# Patient Record
Sex: Female | Born: 1995 | Race: Black or African American | Hispanic: No | Marital: Single | Smoking: Never smoker
Health system: Southern US, Community
[De-identification: ages and names within clinical notes are randomized; demographics above are authoritative.]

## PROBLEM LIST (undated history)

## (undated) DIAGNOSIS — N906 Unspecified hypertrophy of vulva: Secondary | ICD-10-CM

## (undated) DIAGNOSIS — E229 Hyperfunction of pituitary gland, unspecified: Secondary | ICD-10-CM

## (undated) DIAGNOSIS — K5900 Constipation, unspecified: Secondary | ICD-10-CM

## (undated) DIAGNOSIS — J45909 Unspecified asthma, uncomplicated: Secondary | ICD-10-CM

## (undated) DIAGNOSIS — N643 Galactorrhea not associated with childbirth: Secondary | ICD-10-CM

## (undated) DIAGNOSIS — F209 Schizophrenia, unspecified: Secondary | ICD-10-CM

## (undated) DIAGNOSIS — Z8669 Personal history of other diseases of the nervous system and sense organs: Secondary | ICD-10-CM

## (undated) DIAGNOSIS — R198 Other specified symptoms and signs involving the digestive system and abdomen: Secondary | ICD-10-CM

## (undated) DIAGNOSIS — G4730 Sleep apnea, unspecified: Secondary | ICD-10-CM

## (undated) DIAGNOSIS — G43909 Migraine, unspecified, not intractable, without status migrainosus: Secondary | ICD-10-CM

## (undated) DIAGNOSIS — R2232 Localized swelling, mass and lump, left upper limb: Secondary | ICD-10-CM

## (undated) DIAGNOSIS — N281 Cyst of kidney, acquired: Secondary | ICD-10-CM

## (undated) DIAGNOSIS — R1310 Dysphagia, unspecified: Secondary | ICD-10-CM

## (undated) DIAGNOSIS — F509 Eating disorder, unspecified: Secondary | ICD-10-CM

## (undated) HISTORY — DX: Galactorrhea not associated with childbirth: N64.3

## (undated) HISTORY — DX: Hyperfunction of pituitary gland, unspecified: E22.9

## (undated) HISTORY — PX: UMBILICAL HERNIA REPAIR: SHX196

## (undated) HISTORY — DX: Sleep apnea, unspecified: G47.30

## (undated) HISTORY — DX: Unspecified hypertrophy of vulva: N90.60

## (undated) HISTORY — PX: MRI: SHX5353

## (undated) HISTORY — DX: Constipation, unspecified: K59.00

## (undated) HISTORY — PX: COLONOSCOPY: SHX174

---
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
## 2015-01-02 ENCOUNTER — Encounter: Payer: Medicaid Other | Attending: Pediatrics | Admitting: *Deleted

## 2015-01-02 ENCOUNTER — Ambulatory Visit: Payer: Medicaid Other | Admitting: *Deleted

## 2015-01-02 DIAGNOSIS — E282 Polycystic ovarian syndrome: Secondary | ICD-10-CM | POA: Insufficient documentation

## 2015-01-02 DIAGNOSIS — Z713 Dietary counseling and surveillance: Secondary | ICD-10-CM | POA: Insufficient documentation

## 2015-01-02 NOTE — Patient Instructions (Signed)
Size doesn't matter!! Health is what matters and your cholesterol, blood sugar challenges CAN be improved with food and exercise, even if you never lose weight.  And you can control your health, even if your weight never changes  Follow positive people/organizations on social medial like [employer] your beauty standards and the [employer].  Surround yourself with pictures of other awesome ladies like you!  For your health: Exercise by walking on the treadmill or doing dance video.  This will help your heart and your mind be healthy Eat well for your spirit and to nourish your body Limit the sweet drinks like tea, soda, and juice. Instead drink more water  Continue to eat breakfast, but make sure you get 3 meals in instead of just 2.  Eat your veggies J

## 2015-01-02 NOTE — Progress Notes (Signed)
  Pediatric Medical Nutrition Therapy:  Appt start time: 1000 end time:  1030.  Primary Concerns Today:  Natalie Wagner is here with her mom for follow up nutrition counseling pertaining to PCOS, weight management, hyperlipidemia, lactose intolerance, IBS, and multi-nutrient deficiencies.  She has started eating breakfast, as recommended!  She was exercising some at the fitness center (treadmill and hand weights), but stopped when she learned her lab results might be abnormal (per patient)  Those labs were repeated and do not appear abnormal.  She drinks mostly sugary beverages, eats little vegetables, and still skips meals regularly.  She does not eat protein with her carbohydrates .    Preferred Learning Style:   Auditory   Visual  Learning Readiness:   Change in progress   Medications: see list Supplements: none  24-hr dietary recall: Oatmeal with honey. Pink lemonade, grits and eggs    Usual physical activity: none lately.  Estimated energy needs: 1600 calories   Nutritional Diagnosis:  Rhodes-2.1 Inpaired nutrition utilization As related to carbohydrates.  As evidenced by PCOS.  Intervention/Goals: Nutrition counseling provided.  Reviewed messages from last visits: importance of eating 3 regular meals/day and avoid meal skipping, regular physical activity, and non-starchy vegetables.  Talked about the impact of lifestyle changes on her health, not her weight.  She is struggling with body image and desire to lose weight.  What is more important is her health and that she can control  Goals: Follow positive people/organizations on social medial like [employer] and the [employer].  Surround yourself with pictures of other awesome ladies like you! Exercise by walking on the treadmill or doing dance video.  This will help your heart and your mind be healthy Eat well for your spirit and to nourish your body Limit the sweet drinks like tea, soda, and juice. Instead  drink more water Continue to eat breakfast, but make sure you get 3 meals in instead of just 2.   Eat your veggies   Teaching Method Utilized:  Auditory   Barriers to learning/adherence to lifestyle change: motivation to change  Demonstrated degree of understanding via:  Teach Back   Monitoring/Evaluation:  Dietary intake, exercise, and body weight in 6 week(s).

## 2015-01-07 ENCOUNTER — Telehealth: Payer: Self-pay | Admitting: *Deleted

## 2015-01-07 NOTE — Telephone Encounter (Signed)
-----   Message from Trude Mcburney, [place] sent at 01/04/2015  9:50 PM EST ----- Regarding: changing appointment time  Looks like this patient got her MRI scheduled but not until after her appointment with Dr. Henrene Pastor 3/1-- can we move her appt until after her MRI is done?   Thanks!

## 2015-01-07 NOTE — Telephone Encounter (Signed)
This patient got her MRI scheduled but not until after her appointment with Dr. Henrene Pastor 3/1. Left VM stating that we need to reschedule and asked pt to call the office so we can move her appt after her MRI is done. Callback number provided.

## 2015-01-08 ENCOUNTER — Ambulatory Visit: Payer: Medicaid Other | Admitting: Pediatrics

## 2015-01-08 ENCOUNTER — Encounter: Payer: Self-pay | Admitting: Pediatrics

## 2015-01-08 NOTE — Progress Notes (Signed)
Pre-Visit Planning  Review of previous notes:  Last seen in [facility] on 12/27/14.  Treatment plan at last visit included continue zyprexa per psych given there has been no change in dose and thus unlikely cause of elevated prolactin.  Schedule MRI to rule out prolactinoma.  Labs to evaluate other possible etiologies.  Pt seen by Dr. Dwyane Dee 12/31/14 who advised continue with work-up as planned.  Of note Dr. Dwyane Dee received notification that patient's mother is having the same symptoms after being recently started on antipsychotic; thus, patient may have symptoms due to environmental proximity with mother.   Previous Psych Screenings?  Followed by psychiatry Psych Screenings Due? n/a  STI screen in the past year? yes Pertinent Labs? yes,  Component     Latest Ref Rng 12/27/2014  Sodium     135 - 145 mEq/L 139  Potassium     3.5 - 5.3 mEq/L 4.3  Chloride     96 - 112 mEq/L 101  CO2     19 - 32 mEq/L 28  Glucose     70 - 99 mg/dL 80  BUN     6 - 23 mg/dL 12  Creatinine     0.50 - 1.10 mg/dL 0.68  Total Bilirubin     0.2 - 1.1 mg/dL 0.3  Alkaline Phosphatase     39 - 117 U/L 90  AST     0 - 37 U/L 12  ALT     0 - 35 U/L 14  Total Protein     6.0 - 8.3 g/dL 7.2  Albumin     3.5 - 5.2 g/dL 4.4  Calcium     8.4 - 10.5 mg/dL 9.5  Preg Test, Ur      Negative  TSH     0.350 - 4.500 uIU/mL 3.773  Free T4     0.80 - 1.80 ng/dL 1.04  FSH      4.4  LH      6.5  Testosterone     15 - 40 ng/dL 58 (H)  Estradiol      40.9   To Do at visit:   - reassess galactorrhea - recheck prolactin - confirm MRI on schedule - obtain detailed history regarding mother's symptoms

## 2015-01-10 ENCOUNTER — Other Ambulatory Visit (HOSPITAL_COMMUNITY): Payer: Self-pay | Admitting: Psychiatry

## 2015-01-11 NOTE — Telephone Encounter (Signed)
Returned Mom's call, lft message to call us back.

## 2015-01-14 ENCOUNTER — Other Ambulatory Visit: Payer: Self-pay | Admitting: Pediatrics

## 2015-01-14 ENCOUNTER — Ambulatory Visit (HOSPITAL_COMMUNITY)
Admission: RE | Admit: 2015-01-14 | Discharge: 2015-01-14 | Disposition: A | Discharge status: Home / Self Care | Payer: Medicaid Other | Source: Ambulatory Visit | Attending: Pediatrics | Admitting: Pediatrics

## 2015-01-14 DIAGNOSIS — N643 Galactorrhea not associated with childbirth: Secondary | ICD-10-CM | POA: Diagnosis not present

## 2015-01-14 DIAGNOSIS — R7989 Other specified abnormal findings of blood chemistry: Secondary | ICD-10-CM

## 2015-01-14 DIAGNOSIS — E229 Hyperfunction of pituitary gland, unspecified: Secondary | ICD-10-CM

## 2015-01-14 MED ORDER — GADOBENATE DIMEGLUMINE 529 MG/ML IV SOLN
10.0000 mL | Freq: Once | INTRAVENOUS | Status: AC | PRN
  Administered 2015-01-14: 7 mL via INTRAVENOUS

## 2015-01-14 NOTE — Progress Notes (Signed)
Ask her yto make appointment with me , so we can discuss it

## 2015-01-14 NOTE — Progress Notes (Signed)
Quick Note:  Spoke with mother to notify of normal MRI. Mother reports patient is still having bil galactorrhea. Mother is also experiencing galactorrhea and was told that her own medications which are different from [place], are likely causing the symptoms. Mother's medications have not been adjusted due to these symptoms. No medical etiology otherwise identified for Natalie Wagner's symptoms. Will defer to Dr. Dwyane Dee regarding next steps. ______

## 2015-01-15 ENCOUNTER — Other Ambulatory Visit: Payer: Self-pay | Admitting: Pediatrics

## 2015-01-15 NOTE — Telephone Encounter (Signed)
Sent by mistake to me

## 2015-01-23 ENCOUNTER — Encounter: Payer: Self-pay | Admitting: Pediatrics

## 2015-01-23 ENCOUNTER — Ambulatory Visit (INDEPENDENT_AMBULATORY_CARE_PROVIDER_SITE_OTHER): Payer: Medicaid Other | Admitting: Pediatrics

## 2015-01-23 VITALS — BP 112/68 | Ht 59.0 in | Wt 166.4 lb

## 2015-01-23 DIAGNOSIS — Z68.41 Body mass index (BMI) pediatric, greater than or equal to 95th percentile for age: Secondary | ICD-10-CM | POA: Diagnosis not present

## 2015-01-23 DIAGNOSIS — O926 Galactorrhea: Secondary | ICD-10-CM | POA: Diagnosis not present

## 2015-01-23 DIAGNOSIS — N643 Galactorrhea not associated with childbirth: Secondary | ICD-10-CM

## 2015-01-23 DIAGNOSIS — E282 Polycystic ovarian syndrome: Secondary | ICD-10-CM

## 2015-01-23 DIAGNOSIS — R7989 Other specified abnormal findings of blood chemistry: Secondary | ICD-10-CM

## 2015-01-23 DIAGNOSIS — E229 Hyperfunction of pituitary gland, unspecified: Secondary | ICD-10-CM | POA: Diagnosis not present

## 2015-01-23 NOTE — Patient Instructions (Addendum)
Metformin-- start taking one in the morning and one in the evening.   She needs to take Vitamin D. Since she can't swallow pills, find one supplement that is either chewable or gummy that is at least 2000 IU.

## 2015-01-23 NOTE — Progress Notes (Signed)
Adolescent Medicine Consultation Follow-Up Visit Natalie Wagner  is a 19 y.o. female referred by Dominic Pea, MD here today for follow-up of galactorrhea, MRI.   Previsit planning completed:  yes  Growth Chart Viewed? yes  PCP Confirmed?  yes   History was provided by the patient and mother.  HPI: her nipple discharge has significantly improved. If she stimulates the area sometimes she will have discharge but not spontaneously. They spoke to Dr. Dwyane Dee who wants to keep things the same and monitor.   She has a period in February but hasn't had one yet in March.   Taking metformin. That is going pretty well.   She has her pre-op visit coming up at the end of March. She is excited about the surgery. Mom is worried if she is mentally ready to have it.   She continues not to be sexually active and is possibly interested in the nexplanon in the future.   Patient's last menstrual period was 12/17/2014 (exact date).   Review of Systems  Constitutional: Negative for weight loss and malaise/fatigue.  Eyes: Negative for blurred vision.  Respiratory: Negative for shortness of breath.   Cardiovascular: Negative for chest pain and palpitations.  Gastrointestinal: Negative for nausea, vomiting, abdominal pain and constipation.  Genitourinary: Negative for dysuria.  Musculoskeletal: Negative for myalgias.  Neurological: Negative for dizziness and headaches.  Psychiatric/Behavioral: Negative for depression.     The following portions of the patient's history were reviewed and updated as appropriate: allergies, current medications, past family history, past medical history, past surgical history and problem list.  Allergies  Allergen Reactions  . Lactose Intolerance (Gi) Diarrhea    Social History: Sleep: sleeping well  Eating Habits: working on portion control with zyprexa  Exercise: none  Physical Exam:  Filed Vitals:   01/23/15 1048  BP: 112/68  Height: 4\' 11"  (1.499 m)   Weight: 166 lb 6.4 oz (75.479 kg)   BP 112/68 mmHg  Ht 4\' 11"  (1.499 m)  Wt 166 lb 6.4 oz (75.479 kg)  BMI 33.59 kg/m2  LMP 12/17/2014 (Exact Date) Body mass index: body mass index is 33.59 kg/(m^2). Blood pressure percentiles are 93% systolic and 23% diastolic based on 5573 NHANES data. Blood pressure percentile targets: 90: 121/78, 95: 125/82, 99 + 5 mmHg: 137/95.  Physical Exam  Constitutional: She is oriented to person, place, and time. She appears well-developed and well-nourished.  HENT:  Head: Normocephalic.  Neck: No thyromegaly present.  Cardiovascular: Normal rate, regular rhythm, normal heart sounds and intact distal pulses.   Pulmonary/Chest: Effort normal and breath sounds normal.  Abdominal: Soft. Bowel sounds are normal. There is no tenderness.  Musculoskeletal: Normal range of motion.  Neurological: She is alert and oriented to person, place, and time.  Skin: Skin is warm and dry.  Psychiatric: She has a normal mood and affect.    Assessment/Plan: 1. PCOS (polycystic ovarian syndrome) Currently cycling somewhat regularly. She may be interested in nexplanon in the future. Discussed at length the relationship of metformin to increasing likelihood of pregnancy in people with PCOS and dangers of her psych meds if she were to get pregnant.   2. BMI (body mass index), pediatric, > 99% for age Work in additional exercise and continue good portion control which is different with Zyprexa.   3. Elevated prolactin level Will repeat in the future, however, it seems to be caused by Zyprexa as MRI was normal. Psychiatry electing to keep her on it at this time.  4. Galactorrhea Improving.    Follow-up: June for [facility]; July with Dr. Henrene Pastor     Medical decision-making:  > 25 minutes spent, more than 50% of appointment was spent discussing diagnosis and management of symptoms

## 2015-01-30 ENCOUNTER — Ambulatory Visit: Payer: Medicaid Other | Admitting: *Deleted

## 2015-02-07 DIAGNOSIS — Z0271 Encounter for disability determination: Secondary | ICD-10-CM

## 2015-02-12 ENCOUNTER — Ambulatory Visit (HOSPITAL_COMMUNITY): Payer: Self-pay | Admitting: Psychiatry

## 2015-03-15 ENCOUNTER — Other Ambulatory Visit: Payer: Self-pay | Admitting: Pediatrics

## 2015-04-04 ENCOUNTER — Telehealth (HOSPITAL_COMMUNITY): Payer: Self-pay

## 2015-04-04 ENCOUNTER — Ambulatory Visit (HOSPITAL_COMMUNITY): Payer: Self-pay | Admitting: Psychiatry

## 2015-04-04 NOTE — Telephone Encounter (Signed)
Telephone message from patient requesting a refill of her prescribed Zyprexa.  Patient was canceled from today and no showed for an appointment 02/12/15.  Patient is now rescheduled to return to see Dr. Dwyane Dee on 04/25/15.

## 2015-04-09 ENCOUNTER — Other Ambulatory Visit (HOSPITAL_COMMUNITY): Payer: Self-pay | Admitting: Psychiatry

## 2015-04-09 DIAGNOSIS — F200 Paranoid schizophrenia: Secondary | ICD-10-CM

## 2015-04-09 MED ORDER — OLANZAPINE 15 MG PO TBDP: 15.0000 mg | ORAL_TABLET | Freq: Every day | ORAL | Status: DC

## 2015-04-09 NOTE — Telephone Encounter (Signed)
Refill for Zyprexa done

## 2015-04-24 ENCOUNTER — Ambulatory Visit: Payer: Medicaid Other | Admitting: Pediatrics

## 2015-04-25 ENCOUNTER — Ambulatory Visit (INDEPENDENT_AMBULATORY_CARE_PROVIDER_SITE_OTHER): Payer: Federal, State, Local not specified - Other | Admitting: Psychiatry

## 2015-04-25 ENCOUNTER — Ambulatory Visit (HOSPITAL_COMMUNITY): Payer: Self-pay | Admitting: Psychiatry

## 2015-04-25 VITALS — BP 121/72 | HR 70 | Ht 58.5 in | Wt 155.8 lb

## 2015-04-25 DIAGNOSIS — F203 Undifferentiated schizophrenia: Secondary | ICD-10-CM | POA: Diagnosis not present

## 2015-04-25 DIAGNOSIS — F913 Oppositional defiant disorder: Secondary | ICD-10-CM

## 2015-04-25 DIAGNOSIS — F200 Paranoid schizophrenia: Secondary | ICD-10-CM

## 2015-04-25 MED ORDER — OLANZAPINE 15 MG PO TBDP: 15.0000 mg | ORAL_TABLET | Freq: Every day | ORAL | Status: DC

## 2015-04-25 NOTE — Progress Notes (Signed)
Patient ID: Natalie Wagner, female   DOB: 1996-10-23, 19 y.o.   MRN: [id number]  Natalie Wagner  Natalie Wagner [id number] 19 y.o.  04/25/2015 10:47 AM  Chief Complaint: I am working on losing weight, I'm doing well.  History of Present Illness: Patient is a 19 year old female diagnosed with schizophrenia who presents today for a followup visit.  Patient reports that she's no longer having any lactation, adds that her diarrhea has also improved. She states that she's on the lactose-free diet and that seems to help significantly. She also has that she seeing her primary [facility] at [facility].  Patient states that she's taking her medication regularly, adds that she understands that she needs to stay on the Zyprexa as it's helped her significantly. On being asked to elaborate, patient states that she can think clearly, can complete tasks, and is also interacting well with her family. Mom agrees with this   Patient states that she's also been working on making better choices in regards to food, has been walking daily and that has helped her lose weight. She states that she's excited about this.  Both patient and mom deny any  complaints at this visit, any  side effects of the medication, any safety issues.  Suicidal Ideation: No Plan Formed: No Patient has means to carry out plan: No  Homicidal Ideation: No Plan Formed: No Patient has means to carry out plan: No  Review of Systems  Constitutional: Negative.  Negative for fever, weight loss and malaise/fatigue.  HENT: Negative.  Negative for congestion, ear discharge and sore throat.   Eyes: Negative for blurred vision, photophobia, discharge and redness.       Wears glasses  Respiratory: Negative.  Negative for cough and wheezing.   Cardiovascular: Negative.  Negative for chest pain, palpitations and orthopnea.  Gastrointestinal: Negative for heartburn, nausea, vomiting,  abdominal pain, diarrhea and constipation.       Lactose intolerance  Genitourinary: Negative.  Negative for dysuria and urgency.  Musculoskeletal: Negative.  Negative for myalgias, joint pain and falls.  Skin: Negative.  Negative for rash.  Neurological: Negative for dizziness, tingling, seizures, loss of consciousness, weakness and headaches.  Endo/Heme/Allergies: Negative for environmental allergies.  Psychiatric/Behavioral: Negative.  Negative for depression, suicidal ideas, hallucinations, memory loss and substance abuse. The patient is not nervous/anxious and does not have insomnia.     Past Medical Family, Social History: Patient is staying at home Family History  Problem Relation Age of Onset  . Asthma Mother   . Diabetes Maternal Grandfather   . Hypertension Maternal Grandfather   . Heart disease Maternal Grandfather   . Kidney disease Maternal Grandfather   . Anesthesia problems Maternal Grandfather     hx. of being hard to wake up post-op  . Hypertension Maternal Uncle   . Hypertension Maternal Grandmother   . Stroke Maternal Grandmother    Past Medical History  Diagnosis Date  . Migraines   . Asthma     prn inhaler  . Schizophrenia   . History of seizures as a child     mother states were triggered by migraines; no seizures in > 7 yr.  . Difficulty swallowing pills   . 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 Constipation 05/05/2013    Outpatient Encounter Prescriptions as of 04/25/2015  Medication Sig  . albuterol (PROAIR HFA) 108 (90 BASE) MCG/ACT inhaler Inhale  into the lungs.  . hyoscyamine (LEVSIN SL) 0.125 MG SL tablet Take 0.125 mg by mouth.  . metFORMIN (GLUCOPHAGE) 500 MG tablet TAKE 1 TABLET BY MOUTH EVERY DAY WITH BREAKFAST  . olanzapine zydis (ZYPREXA) 15 MG disintegrating tablet Take 1 tablet (15 mg total) by mouth at bedtime.  . Vitamin D, Ergocalciferol, (DRISDOL) 50000 UNITS CAPS capsule Take 1 capsule (50,000 Units  total) by mouth every 7 (seven) days.  . [DISCONTINUED] olanzapine zydis (ZYPREXA) 15 MG disintegrating tablet Take 1 tablet (15 mg total) by mouth at bedtime.  . cetirizine (ZYRTEC) 10 MG tablet Take 10 mg by mouth.  . [DISCONTINUED] ferrous sulfate 325 (65 FE) MG tablet Take 1 tablet (325 mg total) by mouth daily with breakfast. (Patient not taking: Reported on 01/23/2015)  . [DISCONTINUED] medroxyPROGESTERone (PROVERA) 10 MG tablet Take 1 tablet daily for 7 days if you go 3 months or more without a period (Patient not taking: Reported on 12/27/2014)   No facility-administered encounter medications on file as of 04/25/2015.    Past Psychiatric History/Hospitalization(s): Anxiety: No Bipolar Disorder: No Depression: No Mania: No Psychosis: Yes Schizophrenia: Yes Personality Disorder: No Hospitalization for psychiatric illness: Yes History of Electroconvulsive Shock Therapy: No Prior Suicide Attempts: Yes  Physical Exam:AIMS score is 0 Constitutional: Blood pressure 121/72, pulse 70, height 4' 10.5" (1.486 m), weight 155 lb 12.8 oz (70.67 kg). General Appearance: alert, oriented, no acute distress and obese  Musculoskeletal: Strength & Muscle Tone: within normal limits Gait & Station: normal Patient leans: N/A Blood pressure 121/72, pulse 70, height 4' 10.5" (1.486 m), weight 155 lb 12.8 oz (70.67 kg). 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 Continue walking daily and making healthy choices in regards to food as patient has lost weight since her last visit. Continue to follow-up with her primary care physician Call when necessary Follow-up in 4-6 months Hampton Abbot, MD 04/25/2015

## 2015-05-02 ENCOUNTER — Encounter (HOSPITAL_COMMUNITY): Payer: Self-pay | Admitting: Psychiatry

## 2015-05-04 ENCOUNTER — Other Ambulatory Visit: Payer: Self-pay | Admitting: Pediatrics

## 2015-05-05 NOTE — Telephone Encounter (Signed)
refill 

## 2015-05-06 NOTE — Telephone Encounter (Signed)
refill 

## 2015-05-20 ENCOUNTER — Ambulatory Visit (INDEPENDENT_AMBULATORY_CARE_PROVIDER_SITE_OTHER): Payer: Medicaid Other | Admitting: Pediatrics

## 2015-05-20 ENCOUNTER — Encounter: Payer: Self-pay | Admitting: Pediatrics

## 2015-05-20 VITALS — BP 130/72 | HR 83 | Ht 58.5 in | Wt 157.4 lb

## 2015-05-20 DIAGNOSIS — N898 Other specified noninflammatory disorders of vagina: Secondary | ICD-10-CM

## 2015-05-20 DIAGNOSIS — Z68.41 Body mass index (BMI) pediatric, greater than or equal to 95th percentile for age: Secondary | ICD-10-CM | POA: Diagnosis not present

## 2015-05-20 DIAGNOSIS — R2232 Localized swelling, mass and lump, left upper limb: Secondary | ICD-10-CM | POA: Diagnosis not present

## 2015-05-20 DIAGNOSIS — E282 Polycystic ovarian syndrome: Secondary | ICD-10-CM | POA: Diagnosis not present

## 2015-05-20 DIAGNOSIS — L83 Acanthosis nigricans: Secondary | ICD-10-CM | POA: Diagnosis not present

## 2015-05-20 DIAGNOSIS — O926 Galactorrhea: Secondary | ICD-10-CM

## 2015-05-20 DIAGNOSIS — N921 Excessive and frequent menstruation with irregular cycle: Secondary | ICD-10-CM

## 2015-05-20 DIAGNOSIS — N643 Galactorrhea not associated with childbirth: Secondary | ICD-10-CM

## 2015-05-20 LAB — POCT GLYCOSYLATED HEMOGLOBIN (HGB A1C): Hemoglobin A1C: 5.4

## 2015-05-20 MED ORDER — NORGESTIMATE-ETH ESTRADIOL 0.25-35 MG-MCG PO TABS: 1.0000 | ORAL_TABLET | Freq: Every day | ORAL | Status: DC

## 2015-05-20 MED ORDER — METFORMIN HCL ER 500 MG PO TB24: 1500.0000 mg | ORAL_TABLET | Freq: Every day | ORAL | Status: DC

## 2015-05-20 NOTE — Progress Notes (Signed)
Adolescent Medicine Consultation Follow-Up Visit Natalie Wagner  is a 19 y.o. female referred by Dominic Pea, MD here today for follow-up of PCOS, galactorrhea.   Previsit planning completed:  yes  Growth Chart Viewed? yes  PCP Confirmed?  yes   History was provided by the patient and mother.  HPI:  She has been taking her metformin and watching what she is eating. She lost 11 pounds. She is trying to do some exercise. Thinking about starting school for [employer].   Feels like cramps are somewhat better but is worried about her acanthosis and hair. Shaving hair on chin almost every day. Has a lot of hair growth on chest as well. Acanthosis is more severe on neck and under arms.   Periods are heavier and they are lasting longer. They are coming every month.   She got her surgery for her labia. They feel she has healed very well. She feels like it is much more comfortable and is happy with it. She is worried that there is an infection. She is having itching and white/yellow discharge. It has a mild fishy odor. It is worse after her period. Not sexually active.    Patient's last menstrual period was 05/08/2015 (exact date).  The following portions of the patient's history were reviewed and updated as appropriate: allergies, current medications, past family history, past medical history, past social history and problem list.  Review of Systems  Constitutional: Negative for weight loss and malaise/fatigue.  Eyes: Negative for blurred vision.  Respiratory: Negative for shortness of breath.   Cardiovascular: Negative for chest pain and palpitations.  Gastrointestinal: Negative for nausea, vomiting, abdominal pain and constipation.  Genitourinary: Negative for dysuria.       Vaginal discharge and itching  Musculoskeletal: Negative for myalgias.  Neurological: Negative for dizziness and headaches.  Psychiatric/Behavioral: Negative for depression.     Allergies  Allergen  Reactions  . Lactose Intolerance (Gi) Diarrhea    Social History: Sleep: Sleeping well  Eating Habits: As above  Exercise: As above   Confidentiality was discussed with the patient and if applicable, with caregiver as well.  Patient's personal or confidential phone number:  Tobacco? no Secondhand smoke exposure?no Drugs/EtOH?no Sexually active?no; men  Pregnancy Prevention: OCPs, reviewed condoms & plan B Safe at home, in school & in relationships? Yes Guns in the home? no Safe to self? Yes  Physical Exam:  Filed Vitals:   05/20/15 1102  BP: 130/72  Pulse: 83  Height: 4' 10.5" (1.486 m)  Weight: 157 lb 6.4 oz (71.396 kg)   BP 130/72 mmHg  Pulse 83  Ht 4' 10.5" (1.486 m)  Wt 157 lb 6.4 oz (71.396 kg)  BMI 32.33 kg/m2  LMP 05/08/2015 (Exact Date) Body mass index: body mass index is 32.33 kg/(m^2). Blood pressure percentiles are 60% systolic and 10% diastolic based on 9323 NHANES data. Blood pressure percentile targets: 90: 121/78, 95: 125/82, 99 + 5 mmHg: 137/94.  Physical Exam  Constitutional: She is oriented to person, place, and time. She appears well-developed and well-nourished.  HENT:  Head: Normocephalic.  Chin hair- dark and coarse  Neck: No thyromegaly present.  Acanthosis   Cardiovascular: Normal rate, regular rhythm, normal heart sounds and intact distal pulses.   Pulmonary/Chest: Effort normal and breath sounds normal.  Coarse hair across chest  Abdominal: Soft. Bowel sounds are normal. There is no tenderness.  Genitourinary:  Deferred. Patient self swabbed  Musculoskeletal: Normal range of motion.  Neurological: She is alert and  oriented to person, place, and time.  Skin: Skin is warm and dry.  Psychiatric: She has a normal mood and affect.    Assessment/Plan: 1. PCOS (polycystic ovarian syndrome) Start OCP for menstrual regulation, acne and hair growth. Continue metformin.  - metFORMIN (GLUCOPHAGE XR) 500 MG 24 hr tablet; Take 3 tablets (1,500  mg total) by mouth daily with breakfast.  Dispense: 90 tablet; Refill: 3 - norgestimate-ethinyl estradiol (ORTHO-CYCLEN,SPRINTEC,PREVIFEM) 0.25-35 MG-MCG tablet; Take 1 tablet by mouth daily.  Dispense: 1 Package; Refill: 11 - POCT HgB A1C  2. Galactorrhea Resolved.   3. BMI (body mass index), pediatric, > 99% for age Continue working hard on diet and exercise. Has lost some weight since last visit.   4. Acanthosis Persistent.  - metFORMIN (GLUCOPHAGE XR) 500 MG 24 hr tablet; Take 3 tablets (1,500 mg total) by mouth daily with breakfast.  Dispense: 90 tablet; Refill: 3 - POCT HgB A1C  5. Menorrhagia with irregular cycle Start OCP. Will watch for improvement in cycle.  - norgestimate-ethinyl estradiol (ORTHO-CYCLEN,SPRINTEC,PREVIFEM) 0.25-35 MG-MCG tablet; Take 1 tablet by mouth daily.  Dispense: 1 Package; Refill: 11  6. Vaginal discharge + for BV. Treatment sent to pharmacy. Flagyl 500 mg BID x 7 days. Discussed vaginal hygiene. Hopeful for improvement since labial surgery.  - WET PREP BY MOLECULAR PROBE  7. Finger mass, left Refer again for treatment of recurring cyst to left ring finger.  - Ambulatory referral to Hand Surgery   Follow-up:  6 weeks.   Medical decision-making:  > 40 minutes spent, more than 50% of appointment was spent discussing diagnosis and management of symptoms

## 2015-05-20 NOTE — Patient Instructions (Addendum)
Healthy vaginal hygiene practices   -  Avoid sleeper pajamas. Nightgowns allow air to circulate.  Sleep without underpants whenever possible.  -  Wear cotton underpants during the day. Double-rinse underwear after washing to avoid residual irritants. Do not use fabric softeners for underwear and swimsuits.  - Avoid tights, leotards, leggings, "skinny" jeans, and other tight-fitting clothing. Skirts and loose-fitting pants allow air to circulate.  - Avoid pantyliners.  Instead use tampons or cotton pads.  - Daily warm bathing is helpful:     - Soak in clean water (no soap) for 10 to 15 minutes. Adding vinegar or baking soda to the water has not been specifically studied and may not be better than clean water alone.      - Use soap to wash regions other than the genital area just before getting out of the tub. Limit use of any soap on genital areas. Use fragance-free soaps.     - Rinse the genital area well and gently pat dry.  Don't rub.  Hair dryer to assist with drying can be used only if on cool setting.     - Do not use bubble baths or perfumed soaps.  - Do not use any feminine sprays, douches or powders.  These contain chemicals that will irritate the skin.  - If the genital area is tender or swollen, cool compresses may relieve the discomfort. Unscented wet wipes can be used instead of toilet paper for wiping.   - Emollients, such as Vaseline, may help protect skin and can be applied to the irritated area.  - Always remember to wipe front-to-back after bowel movements. Pat dry after urination.  - Do not sit in wet swimsuits for long periods of time after swimming   Take a multivitamin every day when you are on Metformin. Take Metformin XR 500 mg 1 pill at dinner once daily for 2 weeks Then, take Metformin XR 500 mg 2 pills at dinner once daily for 2 weeks Then, take Metformin XR 500 mg 3 pills at dinner once daily until you see the doctor for your next visit. If you have too much  nausea or diarrhea, decrease your dose for 2 weeks and then try to go back up again.  Take your birth control pill every day at the same time. Make sure you don't miss any!   We will see you again in 6 weeks! I will call you with your lab results. [facility] will call you to set up your referral to hand surgery.   Keep up the good work with paying attention to your eating and moving your body! You're doing GREAT! Your diabetes risk number is slightly higher than last time but should start to get better with more metformin and exercise!

## 2015-05-21 ENCOUNTER — Telehealth: Payer: Self-pay | Admitting: *Deleted

## 2015-05-21 ENCOUNTER — Telehealth: Payer: Self-pay

## 2015-05-21 ENCOUNTER — Other Ambulatory Visit: Payer: Self-pay | Admitting: Pediatrics

## 2015-05-21 DIAGNOSIS — N760 Acute vaginitis: Principal | ICD-10-CM

## 2015-05-21 DIAGNOSIS — B9689 Other specified bacterial agents as the cause of diseases classified elsewhere: Secondary | ICD-10-CM

## 2015-05-21 LAB — WET PREP BY MOLECULAR PROBE
Candida species: NEGATIVE
Gardnerella vaginalis: POSITIVE — AB
Trichomonas vaginosis: NEGATIVE

## 2015-05-21 MED ORDER — METRONIDAZOLE 500 MG PO TABS: 500.0000 mg | ORAL_TABLET | Freq: Two times a day (BID) | ORAL | Status: DC

## 2015-05-21 NOTE — Telephone Encounter (Signed)
Pt called returning your call this afternoon for lab results

## 2015-05-21 NOTE — Telephone Encounter (Signed)
TC to patient. LVM requesting call back to discuss labs and f/u. Phone number provided.

## 2015-05-21 NOTE — Telephone Encounter (Signed)
TC to pt, updated that she has BV again. Sent flagyl 500 mg BID x 7 days to the pharmacy. She should be careful about the vaginal hygiene rules we discussed at her visit to help prevent recurrence. Reviewed vaginal hygiene as outlined in AVS:    - Wear cotton underpants during the day. Double-rinse underwear after washing to avoid residual irritants. Do not use fabric softeners for underwear and swimsuits.  - Avoid tights, leotards, leggings, "skinny" jeans, and other tight-fitting clothing. Skirts and loose-fitting pants allow air to circulate.  - Avoid pantyliners. Instead use tampons or cotton pads.  - Daily warm bathing is helpful:  - Soak in clean water (no soap) for 10 to 15 minutes. Adding vinegar or baking soda to the water has not been specifically studied and may not be better than clean water alone.   - Use soap to wash regions other than the genital area just before getting out of the tub. Limit use of any soap on genital areas. Use fragance-free soaps.  - Rinse the genital area well and gently pat dry. Don't rub. Hair dryer to assist with drying can be used only if on cool setting.  - Do not use bubble baths or perfumed soaps.  Pt verbalized understanding, has callback.

## 2015-05-21 NOTE — Telephone Encounter (Signed)
-----   Message from Trude Mcburney, [place] sent at 05/21/2015  2:37 PM EDT ----- Patient has BV again. Sent flagyl 500 mg BID x 7 days to the pharmacy. She should be careful about the vaginal hygiene rules we discussed at her visit to help prevent recurrence.

## 2015-06-11 ENCOUNTER — Other Ambulatory Visit (HOSPITAL_COMMUNITY): Payer: Self-pay | Admitting: Psychiatry

## 2015-07-01 ENCOUNTER — Encounter: Payer: Self-pay | Admitting: Family

## 2015-07-01 NOTE — Progress Notes (Signed)
Pre-Visit Planning  Natalie Wagner  is a 19 y.o. female referred by Dominic Pea, MD.   Last seen in [facility] on 05/20/2015  for PCOS, galactorrhea.   Previous Psych Screenings?  No, note BH notes, Kumar  Treatment plan at last visit included:  PCOS: Started Sprintec OCPs and continued metformin 500 mg 24 hr x 3 tabs with breakfast for PCOS.  A1C was 5.4.  Galactorrhea had resolved at that time.  She was also treated for recurrent BV.  She was also referred to Hand Surgery for recurring cyst of LEFT ring finger.   Clinical Staff Visit Tasks:   - Urine GC/CT due? no - Psych Screenings Due? no -   Provider Visit Tasks: -Assess PCOS symptoms; assess tolerance to Sprintec and medication compliance -Diet/Exercise - encourage  - Assess interest in LARC?  - Pertinent Labs? No

## 2015-07-02 ENCOUNTER — Ambulatory Visit (INDEPENDENT_AMBULATORY_CARE_PROVIDER_SITE_OTHER): Payer: Medicaid Other | Admitting: Pediatrics

## 2015-07-02 ENCOUNTER — Encounter: Payer: Self-pay | Admitting: Pediatrics

## 2015-07-02 VITALS — BP 123/69 | HR 85 | Ht <= 58 in | Wt 149.0 lb

## 2015-07-02 DIAGNOSIS — K589 Irritable bowel syndrome without diarrhea: Secondary | ICD-10-CM

## 2015-07-02 DIAGNOSIS — E282 Polycystic ovarian syndrome: Secondary | ICD-10-CM

## 2015-07-02 DIAGNOSIS — L700 Acne vulgaris: Secondary | ICD-10-CM | POA: Diagnosis not present

## 2015-07-02 NOTE — Progress Notes (Signed)
Pre-Visit Planning  Natalie Wagner  is a 19 y.o. female referred by Dominic Pea, MD.   Last seen in [facility] on 05/20/2015  for PCOS, galactorrhea.   Previous Psych Screenings?  No, note BH notes, Kumar  Treatment plan at last visit included:  PCOS: Started Sprintec OCPs and continued metformin 500 mg 24 hr x 3 tabs with breakfast for PCOS.  A1C was 5.4.  Galactorrhea had resolved at that time.  She was also treated for recurrent BV.  She was also referred to Hand Surgery for recurring cyst of LEFT ring finger.   Clinical Staff Visit Tasks:   - Urine GC/CT due? no - Psych Screenings Due? no -   Provider Visit Tasks: -Assess PCOS symptoms; assess tolerance to Sprintec and medication compliance -Diet/Exercise - encourage  - Assess interest in LARC?  - Pertinent Labs? No  Adolescent Medicine Consultation Follow-Up Visit Natalie Wagner  is a 19 y.o. female referred by Dominic Pea, MD here today for follow-up of PCOS.   Previsit planning completed:  yes  Growth Chart Viewed? no  PCP Confirmed?  Leroy Sea, MD   History was provided by the patient.  HPI:   Mom present for first part of visit. Then mom sent to lobby for confidential screening with patient.   PCOS:  Trouble with metformin - Once in late July, she had increased her dose of Metformin to 1 in pm and 1 in am. After the am dose, she ate egg and something else she cannot recall that day before she left house, cramping, upset stomach, diarrhea ensued when she was at [facility]. Has stopped taking it since that time. She was learning how to swallow pills at that time, and she was fine with that, however the diarrhea was worst she has ever experienced.  She has been researching Ovasitol, pulling this up on her tablet while in visit, and questions if this can replace metformin.  Sprintec - has been taking that daily. LMP 06/15/15 - bled for 6 days, no breakthrough bleeding; cramps much better. She  denies being sexually active.  Acne - getting better; no new hair growth, still having some on chin Still having some pimples on chest and back, not as many.  Diet: trying to eat better; feels like the metformin has helped her weight loss.  Exercise: none, likes treadmill at gym; has not been in a while.   Patient's last menstrual period was 06/12/2015 (approximate).  The following portions of the patient's history were reviewed and updated as appropriate: allergies, current medications, past family history, past medical history, past social history, past surgical history and problem list.  Allergies  Allergen Reactions  . Lactose Intolerance (Gi) Diarrhea     Confidentiality was discussed with the patient and if applicable, with caregiver as well.  Patient's personal or confidential phone number: on file  Tobacco? no Secondhand smoke exposure?not assessed  Drugs/EtOH?no Sexually active?no Pregnancy Prevention: abstinence, reviewed condoms & plan B; discussed LARC options and safety concerns of medications and pregnancy.  Safe at home, in school & in relationships? Yes Guns in the home? Not assessed Safe to self? Yes   Review of Systems  Constitutional: Negative.   HENT: Negative.   Eyes: Negative.   Respiratory: Negative.   Cardiovascular: Negative.   Gastrointestinal: Negative.   Genitourinary: Negative.   Musculoskeletal: Negative.   Skin: Negative.           Neurological: Negative.   Endo/Heme/Allergies: Negative.   Psychiatric/Behavioral:  Negative.     Physical Exam:  Filed Vitals:   07/02/15 0929  BP: 123/69  Pulse: 85  Height: 4\' 10"  (1.473 m)  Weight: 149 lb (67.586 kg)   BP 123/69 mmHg  Pulse 85  Ht 4\' 10"  (1.473 m)  Wt 149 lb (67.586 kg)  BMI 31.15 kg/m2  LMP 06/12/2015 (Approximate) Body mass index: body mass index is 31.15 kg/(m^2). Blood pressure percentiles are 95% systolic and 97% diastolic based on 4718 NHANES data. Blood pressure percentile  targets: 90: 121/77, 95: 125/81, 99 + 5 mmHg: 137/94.  Physical Exam  Constitutional: She is oriented to person, place, and time. She appears well-developed. No distress.  HENT:  Head: Normocephalic and atraumatic.  Eyes: EOM are normal. Pupils are equal, round, and reactive to light. No scleral icterus.  Neck: Normal range of motion. Neck supple. No thyromegaly present.  Cardiovascular: Normal rate, regular rhythm, normal heart sounds and intact distal pulses.   No murmur heard. Pulmonary/Chest: Effort normal and breath sounds normal.  Abdominal: Soft.  Musculoskeletal: Normal range of motion. She exhibits no edema.  Lymphadenopathy:    She has no cervical adenopathy.  Neurological: She is alert and oriented to person, place, and time. No cranial nerve deficit.  Skin: Skin is warm and dry. No rash noted.  Acanthosis nigricans posterior neck folds; sparse well-healed acne lesions on back; sparse acne noted on chest wall and forehead; mild facial acne scars noted on cheeks/jawline. Coarse facial hairs on chin and upper lip.  Psychiatric: She has a normal mood and affect. Her behavior is normal. Judgment and thought content normal.  Nursing note and vitals reviewed.   Assessment/Plan: 1. PCOS (polycystic ovarian syndrome) -symptom management is improving with addition of Sprintec; discussed Metformin and GI upset.  She was advised to restart Metformin (1 pill) with supper meal and then in 2 weeks increase to 2 pills with supper meal; if she tolerates this dose, she can increase to the 3 pills (1500mg ). If she has GI disturbance with increased dose, she should stay on the dose she can tolerate with minimal side effects. Patient verbalized understanding with teach-back.  -Discussed Ovasitol as a supplement for insulin-resistance, not as a substitute for Metformin. May take additionally; report AEs.   2. Acne vulgaris Improving with addition of estrogen in [place].   3. Irritable bowel  syndrome -stable; discussed that incident at [employer] may have been result of not eating enough with Metformin dose. She describes no other incidents of constipation, diarrhea, cramping.   Follow-up:  Return in about 3 months (around 10/02/2015).   Medical decision-making:  > 25 minutes spent, more than 50% of appointment was spent discussing diagnosis and management of symptoms

## 2015-07-02 NOTE — Patient Instructions (Signed)
Take Metformin 1 pill at night with supper.  In 2 weeks, you may try Metformin 2 pills with supper.  If you have no stomach upset, you may increase to Metformin 3 pills with your supper.  If you have stomach upset, decrease the pills to the level you tolerated.  Call with questions.  Continue other medications as prescribed.  Return in 3 months.

## 2015-07-08 ENCOUNTER — Ambulatory Visit (INDEPENDENT_AMBULATORY_CARE_PROVIDER_SITE_OTHER): Payer: Medicaid Other | Admitting: Pediatrics

## 2015-07-08 ENCOUNTER — Encounter: Payer: Self-pay | Admitting: Pediatrics

## 2015-07-08 VITALS — BP 100/68 | Ht <= 58 in | Wt 150.0 lb

## 2015-07-08 DIAGNOSIS — E559 Vitamin D deficiency, unspecified: Secondary | ICD-10-CM

## 2015-07-08 DIAGNOSIS — Z00121 Encounter for routine child health examination with abnormal findings: Secondary | ICD-10-CM

## 2015-07-08 DIAGNOSIS — E282 Polycystic ovarian syndrome: Secondary | ICD-10-CM

## 2015-07-08 DIAGNOSIS — J4520 Mild intermittent asthma, uncomplicated: Secondary | ICD-10-CM

## 2015-07-08 DIAGNOSIS — Z68.41 Body mass index (BMI) pediatric, greater than or equal to 95th percentile for age: Secondary | ICD-10-CM

## 2015-07-08 DIAGNOSIS — F845 Asperger's syndrome: Secondary | ICD-10-CM | POA: Diagnosis not present

## 2015-07-08 DIAGNOSIS — F209 Schizophrenia, unspecified: Secondary | ICD-10-CM

## 2015-07-08 DIAGNOSIS — J302 Other seasonal allergic rhinitis: Secondary | ICD-10-CM

## 2015-07-08 DIAGNOSIS — Z8742 Personal history of other diseases of the female genital tract: Secondary | ICD-10-CM

## 2015-07-08 DIAGNOSIS — Z113 Encounter for screening for infections with a predominantly sexual mode of transmission: Secondary | ICD-10-CM

## 2015-07-08 DIAGNOSIS — Z0001 Encounter for general adult medical examination with abnormal findings: Secondary | ICD-10-CM

## 2015-07-08 DIAGNOSIS — IMO0002 Reserved for concepts with insufficient information to code with codable children: Secondary | ICD-10-CM

## 2015-07-08 LAB — CBC WITH DIFFERENTIAL/PLATELET
BASOS ABS: 0 10*3/uL (ref 0.0–0.1)
Basophils Relative: 0 % (ref 0–1)
EOS ABS: 0 10*3/uL (ref 0.0–0.7)
Eosinophils Relative: 0 % (ref 0–5)
HCT: 33.9 % — ABNORMAL LOW (ref 36.0–46.0)
Hemoglobin: 11.1 g/dL — ABNORMAL LOW (ref 12.0–15.0)
Lymphocytes Relative: 31 % (ref 12–46)
Lymphs Abs: 1.9 10*3/uL (ref 0.7–4.0)
MCH: 30.1 pg (ref 26.0–34.0)
MCHC: 32.7 g/dL (ref 30.0–36.0)
MCV: 91.9 fL (ref 78.0–100.0)
MPV: 10.1 fL (ref 8.6–12.4)
Monocytes Absolute: 0.5 10*3/uL (ref 0.1–1.0)
Monocytes Relative: 8 % (ref 3–12)
NEUTROS PCT: 61 % (ref 43–77)
Neutro Abs: 3.7 10*3/uL (ref 1.7–7.7)
PLATELETS: 379 10*3/uL (ref 150–400)
RBC: 3.69 MIL/uL — AB (ref 3.87–5.11)
RDW: 12.7 % (ref 11.5–15.5)
WBC: 6.1 10*3/uL (ref 4.0–10.5)

## 2015-07-08 MED ORDER — ALBUTEROL SULFATE HFA 108 (90 BASE) MCG/ACT IN AERS: 2.0000 | INHALATION_SPRAY | RESPIRATORY_TRACT | Status: DC | PRN

## 2015-07-08 MED ORDER — CETIRIZINE HCL 10 MG PO TABS: 10.0000 mg | ORAL_TABLET | Freq: Every day | ORAL | Status: DC

## 2015-07-08 NOTE — Progress Notes (Signed)
Routine Well-Adolescent Visit  PCP: Dominic Pea, MD   History was provided by the patient and mother.  Natalie Wagner is a 19 y.o. female who is here for well child visit.  Current concerns: no concerns, has had her surgery for the enlarged labia majora and she is now reportedly fully healed and likes the results.   She is not currently having and vaginal itch, discharge, or odor.  Adolescent Assessment:  Confidentiality was discussed with the patient and if applicable, with caregiver as well.  Home and Environment:  Lives with: lives at home with mom and siblings Parental relations: single mother Friends/Peers: yes Nutrition/Eating Behaviors: eats good diet Sports/Exercise:  Tries to exercise daily  Education and Employment:  School Status: has completed high school and job searching now  With parent out of the room and confidentiality discussed: yes   Smoking: no Secondhand smoke exposure? no Drugs/EtOH: no   Menstruation:   Menarche: post menarchal, onset at age around 50 last menses if female: August 4-10 and is now on birth control Menstrual History: flow is light   Sexuality:no boyfriend yet, "not ready for all that" Sexually active? no  sexual partners in last year:none contraception use: none Last STI Screening: about 2 months ago   Using chewable Metformin, one a day and is trying to increase over the next few weeks. Cannot swallow the  Vitamin D   Screenings: The patient completed the Rapid Assessment for Adolescent Preventive Services screening questionnaire and the following topics were  discussed: healthy eating, exercise, seatbelt use, tobacco use, condom use, birth control and screen time    PHQ-9 completed and results indicated no depression  Physical Exam:  BP 100/68 mmHg  Ht 4\' 10"  (1.473 m)  Wt 150 lb (68.04 kg)  BMI 31.36 kg/m2  LMP 06/12/2015 (Approximate) Blood pressure percentiles are 16% systolic and 38% diastolic based on 4536  NHANES data.   General Appearance:   alert, oriented, no acute distress and well nourished  HENT: Normocephalic, no obvious abnormality, conjunctiva clear  Mouth:   Normal appearing teeth, no obvious discoloration, dental caries, or dental caps  Neck:   Supple; thyroid: no enlargement, symmetric, no tenderness/mass/nodules  Lungs:   Clear to auscultation bilaterally, normal work of breathing  Heart:   Regular rate and rhythm, S1 and S2 normal, no murmurs;   Abdomen:   Soft, non-tender, no mass, or organomegaly  GU genitalia not examined  Musculoskeletal:   Tone and strength strong and symmetrical, all extremities               Lymphatic:   No cervical adenopathy  Skin/Hair/Nails:   Skin warm, dry and intact, no rashes, no bruises or petechiae  Neurologic:   Strength, gait, and coordination normal and age-appropriate    Assessment/Plan: 1. Encounter for routine child health examination with abnormal findings   - Lipid panel - CBC with Differential - Vit D  25 hydroxy (rtn osteoporosis monitoring) - HIV antibody - RPR - TSH - T4, free  2. BMI (body mass index), pediatric, greater than or equal to 95% for age  - Lipid panel - TSH - T4, free  BMI: is not appropriate for age but is much improved   3. Vitamin D deficiency - she has had problems swallowing the previous Vitamin D meds so have suggested a Vitamin D 1000 IU chew, 2-3 a day and she agrees to try.  4. 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 PCOS (polycystic  ovarian syndrome), followed in adolescent clinic - followed in adolescent clinic, on oral contraceptives now  6. History of vaginitis, gardinerella  July 2016  - HIV antibody - RPR - WET PREP BY MOLECULAR PROBE - GC/chlamydia probe amp, urine  7. Asthma, mild intermittent, uncomplicated  - albuterol (PROAIR HFA) 108 (90 BASE) MCG/ACT inhaler; Inhale 2 puffs into the lungs every 4 (four) hours as needed for wheezing or shortness of  breath.  Dispense: 2 Inhaler; Refill: 11  8. Screening for STD (sexually transmitted disease)  - HIV antibody - RPR - WET PREP BY MOLECULAR PROBE - GC/chlamydia probe amp, urine  9. Seasonal allergies  - cetirizine (ZYRTEC) 10 MG tablet; Take 1 tablet (10 mg total) by mouth daily. For allergy symptoms  Dispense: 30 tablet; Refill: 11  - Follow-up visit in 1 year for next visit, or sooner as needed.   Dominic Pea, MD   Clydia Llano, [place] [facility], [address] [place], [place] [address] [phone] 07/08/2015 11:57 AM

## 2015-07-08 NOTE — Patient Instructions (Addendum)
Well Child Care - 19-19 Years Old SCHOOL PERFORMANCE  Your teenager should begin preparing for college or technical school. To keep your teenager on track, help him or her:   Prepare for college admissions exams and meet exam deadlines.   Fill out college or technical school applications and meet application deadlines.   Schedule time to study. Teenagers with part-time jobs may have difficulty balancing a job and schoolwork. SOCIAL AND EMOTIONAL DEVELOPMENT  Your teenager:  May seek privacy and spend less time with family.  May seem overly focused on himself or herself (self-centered).  May experience increased sadness or loneliness.  May also start worrying about his or her future.  Will want to make his or her own decisions (such as about friends, studying, or extracurricular activities).  Will likely complain if you are too involved or interfere with his or her plans.  Will develop more intimate relationships with friends. ENCOURAGING DEVELOPMENT  Encourage your teenager to:   Participate in sports or after-school activities.   Develop his or her interests.   Volunteer or join a Systems developer.  Help your teenager develop strategies to deal with and manage stress.  Encourage your teenager to participate in approximately 60 minutes of daily physical activity.   Limit television and computer time to 2 hours each day. Teenagers who watch excessive television are more likely to become overweight. Monitor television choices. Block channels that are not acceptable for viewing by teenagers. RECOMMENDED IMMUNIZATIONS  Hepatitis B vaccine. Doses of this vaccine may be obtained, if needed, to catch up on missed doses. A child or teenager aged 11-15 years can obtain a 2-dose series. The second dose in a 2-dose series should be obtained no earlier than 4 months after the first dose.  Tetanus and diphtheria toxoids and acellular pertussis (Tdap) vaccine. A child  or teenager aged 11-18 years who is not fully immunized with the diphtheria and tetanus toxoids and acellular pertussis (DTaP) or has not obtained a dose of Tdap should obtain a dose of Tdap vaccine. The dose should be obtained regardless of the length of time since the last dose of tetanus and diphtheria toxoid-containing vaccine was obtained. The Tdap dose should be followed with a tetanus diphtheria (Td) vaccine dose every 10 years. Pregnant adolescents should obtain 1 dose during each pregnancy. The dose should be obtained regardless of the length of time since the last dose was obtained. Immunization is preferred in the 27th to 36th week of gestation.  Haemophilus influenzae type b (Hib) vaccine. Individuals older than 19 years of age usually do not receive the vaccine. However, any unvaccinated or partially vaccinated individuals aged 84 years or older who have certain high-risk conditions should obtain doses as recommended.  Pneumococcal conjugate (PCV13) vaccine. Teenagers who have certain conditions should obtain the vaccine as recommended.  Pneumococcal polysaccharide (PPSV23) vaccine. Teenagers who have certain high-risk conditions should obtain the vaccine as recommended.  Inactivated poliovirus vaccine. Doses of this vaccine may be obtained, if needed, to catch up on missed doses.  Influenza vaccine. A dose should be obtained every year.  Measles, mumps, and rubella (MMR) vaccine. Doses should be obtained, if needed, to catch up on missed doses.  Varicella vaccine. Doses should be obtained, if needed, to catch up on missed doses.  Hepatitis A virus vaccine. A teenager who has not obtained the vaccine before 19 years of age should obtain the vaccine if he or she is at risk for infection or if hepatitis A  protection is desired.  Human papillomavirus (HPV) vaccine. Doses of this vaccine may be obtained, if needed, to catch up on missed doses.  Meningococcal vaccine. A booster should be  obtained at age 19 years. Doses should be obtained, if needed, to catch up on missed doses. Children and adolescents aged 11-18 years who have certain high-risk conditions should obtain 2 doses. Those doses should be obtained at least 8 weeks apart. Teenagers who are present during an outbreak or are traveling to a country with a high rate of meningitis should obtain the vaccine. TESTING Your teenager should be screened for:   Vision and hearing problems.   Alcohol and drug use.   High blood pressure.  Scoliosis.  HIV. Teenagers who are at an increased risk for hepatitis B should be screened for this virus. Your teenager is considered at high risk for hepatitis B if:  You were born in a country where hepatitis B occurs often. Talk with your health care provider about which countries are considered high-risk.  Your were born in a high-risk country and your teenager has not received hepatitis B vaccine.  Your teenager has HIV or AIDS.  Your teenager uses needles to inject street drugs.  Your teenager lives with, or has sex with, someone who has hepatitis B.  Your teenager is a female and has sex with other males (MSM).  Your teenager gets hemodialysis treatment.  Your teenager takes certain medicines for conditions like cancer, organ transplantation, and autoimmune conditions. Depending upon risk factors, your teenager may also be screened for:   Anemia.   Tuberculosis.   Cholesterol.   Sexually transmitted infections (STIs) including chlamydia and gonorrhea. Your teenager may be considered at risk for these STIs if:  He or she is sexually active.  His or her sexual activity has changed since last being screened and he or she is at an increased risk for chlamydia or gonorrhea. Ask your teenager's health care provider if he or she is at risk.  Pregnancy.   Cervical cancer. Most females should wait until they turn 19 years old to have their first Pap test. Some  adolescent girls have medical problems that increase the chance of getting cervical cancer. In these cases, the health care provider may recommend earlier cervical cancer screening.  Depression. The health care provider may interview your teenager without parents present for at least part of the examination. This can insure greater honesty when the health care provider screens for sexual behavior, substance use, risky behaviors, and depression. If any of these areas are concerning, more formal diagnostic tests may be done. NUTRITION  Encourage your teenager to help with meal planning and preparation.   Model healthy food choices and limit fast food choices and eating out at restaurants.   Eat meals together as a family whenever possible. Encourage conversation at mealtime.   Discourage your teenager from skipping meals, especially breakfast.   Your teenager should:   Eat a variety of vegetables, fruits, and lean meats.   Have 3 servings of low-fat milk and dairy products daily. Adequate calcium intake is important in teenagers. If your teenager does not drink milk or consume dairy products, he or she should eat other foods that contain calcium. Alternate sources of calcium include dark and leafy greens, canned fish, and calcium-enriched juices, breads, and cereals.   Drink plenty of water. Fruit juice should be limited to 8-12 oz (240-360 mL) each day. Sugary beverages and sodas should be avoided.   Avoid foods  high in fat, salt, and sugar, such as candy, chips, and cookies.  Body image and eating problems may develop at this age. Monitor your teenager closely for any signs of these issues and contact your health care provider if you have any concerns. ORAL HEALTH Your teenager should brush his or her teeth twice a day and floss daily. Dental examinations should be scheduled twice a year.  SKIN CARE  Your teenager should protect himself or herself from sun exposure. He or she  should wear weather-appropriate clothing, hats, and other coverings when outdoors. Make sure that your child or teenager wears sunscreen that protects against both UVA and UVB radiation.  Your teenager may have acne. If this is concerning, contact your health care provider. SLEEP Your teenager should get 8.5-9.5 hours of sleep. Teenagers often stay up late and have trouble getting up in the morning. A consistent lack of sleep can cause a number of problems, including difficulty concentrating in class and staying alert while driving. To make sure your teenager gets enough sleep, he or she should:   Avoid watching television at bedtime.   Practice relaxing nighttime habits, such as reading before bedtime.   Avoid caffeine before bedtime.   Avoid exercising within 3 hours of bedtime. However, exercising earlier in the evening can help your teenager sleep well.  PARENTING TIPS Your teenager may depend more upon peers than on you for information and support. As a result, it is important to stay involved in your teenager's life and to encourage him or her to make healthy and safe decisions.   Be consistent and fair in discipline, providing clear boundaries and limits with clear consequences.  Discuss curfew with your teenager.   Make sure you know your teenager's friends and what activities they engage in.  Monitor your teenager's school progress, activities, and social life. Investigate any significant changes.  Talk to your teenager if he or she is moody, depressed, anxious, or has problems paying attention. Teenagers are at risk for developing a mental illness such as depression or anxiety. Be especially mindful of any changes that appear out of character.  Talk to your teenager about:  Body image. Teenagers may be concerned with being overweight and develop eating disorders. Monitor your teenager for weight gain or loss.  Handling conflict without physical violence.  Dating and  sexuality. Your teenager should not put himself or herself in a situation that makes him or her uncomfortable. Your teenager should tell his or her partner if he or she does not want to engage in sexual activity. SAFETY   Encourage your teenager not to blast music through headphones. Suggest he or she wear earplugs at concerts or when mowing the lawn. Loud music and noises can cause hearing loss.   Teach your teenager not to swim without adult supervision and not to dive in shallow water. Enroll your teenager in swimming lessons if your teenager has not learned to swim.   Encourage your teenager to always wear a properly fitted helmet when riding a bicycle, skating, or skateboarding. Set an example by wearing helmets and proper safety equipment.   Talk to your teenager about whether he or she feels safe at school. Monitor gang activity in your neighborhood and local schools.   Encourage abstinence from sexual activity. Talk to your teenager about sex, contraception, and sexually transmitted diseases.   Discuss cell phone safety. Discuss texting, texting while driving, and sexting.   Discuss Internet safety. Remind your teenager not to disclose   information to strangers over the Internet. Home environment:  Equip your home with smoke detectors and change the batteries regularly. Discuss home fire escape plans with your teen.  Do not keep handguns in the home. If there is a handgun in the home, the gun and ammunition should be locked separately. Your teenager should not know the lock combination or where the key is kept. Recognize that teenagers may imitate violence with guns seen on television or in movies. Teenagers do not always understand the consequences of their behaviors. Tobacco, alcohol, and drugs:  Talk to your teenager about smoking, drinking, and drug use among friends or at friends' homes.   Make sure your teenager knows that tobacco, alcohol, and drugs may affect brain  development and have other health consequences. Also consider discussing the use of performance-enhancing drugs and their side effects.   Encourage your teenager to call you if he or she is drinking or using drugs, or if with friends who are.   Tell your teenager never to get in a car or boat when the driver is under the influence of alcohol or drugs. Talk to your teenager about the consequences of drunk or drug-affected driving.   Consider locking alcohol and medicines where your teenager cannot get them. Driving:  Set limits and establish rules for driving and for riding with friends.   Remind your teenager to wear a seat belt in cars and a life vest in boats at all times.   Tell your teenager never to ride in the bed or cargo area of a pickup truck.   Discourage your teenager from using all-terrain or motorized vehicles if younger than 16 years. WHAT'S NEXT? Your teenager should visit a pediatrician yearly.  Document Released: 01/21/2007 Document Revised: 03/12/2014 Document Reviewed: 07/11/2013 [facility] Patient Information 2015 [place], [place]. This information is not intended to replace advice given to you by your health care provider. Make sure you discuss any questions you have with your health care provider.   Take 2 or 3 a day of the vitamin D chews

## 2015-07-09 ENCOUNTER — Ambulatory Visit: Payer: Medicaid Other | Admitting: Pediatrics

## 2015-07-09 ENCOUNTER — Telehealth: Payer: Self-pay | Admitting: Licensed Clinical Social Worker

## 2015-07-09 LAB — WET PREP BY MOLECULAR PROBE
Candida species: NEGATIVE
GARDNERELLA VAGINALIS: POSITIVE — AB
Trichomonas vaginosis: NEGATIVE

## 2015-07-09 LAB — LIPID PANEL
Cholesterol: 163 mg/dL (ref 125–170)
HDL: 46 mg/dL (ref 36–76)
LDL Cholesterol: 96 mg/dL (ref <110)
Total CHOL/HDL Ratio: 3.5 Ratio (ref <=5.0)
Triglycerides: 107 mg/dL (ref 40–136)
VLDL: 21 mg/dL (ref <30)

## 2015-07-09 LAB — RPR

## 2015-07-09 LAB — HIV ANTIBODY (ROUTINE TESTING W REFLEX): HIV 1&2 Ab, 4th Generation: NONREACTIVE

## 2015-07-09 LAB — TSH: TSH: 1.072 u[IU]/mL (ref 0.350–4.500)

## 2015-07-09 LAB — T4, FREE: Free T4: 1.15 ng/dL (ref 0.80–1.80)

## 2015-07-09 LAB — VITAMIN D 25 HYDROXY (VIT D DEFICIENCY, FRACTURES): VIT D 25 HYDROXY: 31 ng/mL (ref 30–100)

## 2015-07-09 NOTE — Telephone Encounter (Signed)
Spoke to mom. Discussed importance of caregiver health. She stated some side effects to her personal medications but that she has an upcoming appt to discuss with her own provider. Encouraged her to call her doctor's office and describe side effects, maybe it's not a good idea to wait 1 month? Also encouraged her to take her medications regularly as prescribed. Discussed [facility] as a back-up option. She voiced agreement.   Vance Gather, MSW, [facility]

## 2015-07-09 NOTE — Progress Notes (Signed)
    Positive gardnerella screen. Discussed with Dr. Henrene Pastor.  Will not need treatment since not symptomatic and seems to be recurrent partly due to anatomy with large redundant labia.    She has had labial reduction.   Will not treat at this time. Clydia Llano, MD [facility], [address] [place], [place] [address] [phone] 07/09/2015 2:59 PM

## 2015-07-10 LAB — GC/CHLAMYDIA PROBE AMP, URINE
Chlamydia, Swab/Urine, PCR: NEGATIVE
GC PROBE AMP, URINE: NEGATIVE

## 2015-07-16 ENCOUNTER — Ambulatory Visit (INDEPENDENT_AMBULATORY_CARE_PROVIDER_SITE_OTHER): Payer: Medicaid Other | Admitting: Pediatrics

## 2015-07-16 ENCOUNTER — Encounter: Payer: Self-pay | Admitting: Pediatrics

## 2015-07-16 VITALS — Temp 97.9°F | Wt 146.8 lb

## 2015-07-16 DIAGNOSIS — L989 Disorder of the skin and subcutaneous tissue, unspecified: Secondary | ICD-10-CM

## 2015-07-16 MED ORDER — MUPIROCIN 2 % EX OINT: TOPICAL_OINTMENT | CUTANEOUS | Status: DC

## 2015-07-16 NOTE — Progress Notes (Signed)
I saw and evaluated the patient, performing the key elements of the service. I developed the management plan that is described in the resident's note, and I agree with the content.   Georgia Duff B                  07/16/2015, 4:22 PM

## 2015-07-16 NOTE — Progress Notes (Signed)
History was provided by the patient and mother.  Natalie Wagner is a 19 y.o. female who is here for itchy belly button.     HPI:  Natalie Wagner is an 20 year old female with a complex medical history including PCOS, schizophrenia, asthma, migraines and obesity who presents with several days of itchiness and clear drainage within the umbilical area. Natalie Wagner first noticed itchiness within her belly button area and reports she was persistently scratching her periumbilical and umbilical area. The following morning she noticed clear drainage that has persisted, but is not copious. The skin within her belly button is red and painful. She applied Neosporin once and thinks it made the redness and itching worse. She finished a course of metronidazole within the past month for BV. She had a hernia repair around 79 months of age but has had no further issues. Patient denies any insect bites and no recent changes to soaps, detergents, or lotions.    The following portions of the patient's history were reviewed and updated as appropriate: allergies, current medications, past family history, past medical history, past social history, past surgical history and problem list.  Physical Exam:  Temp(Src) 97.9 F (36.6 C) (Temporal)  Wt 66.588 kg (146 lb 12.8 oz)  LMP 06/06/2015 (Exact Date)  No blood pressure reading on file for this encounter. Patient's last menstrual period was 06/06/2015 (exact date).    General:   well-appearing  Skin:   skin within umbilicus is erythematous with areas of erosion, no drainage noted, no fluctuance   Oral cavity:   MMM  Eyes:   sclerae white  Nose: clear, no discharge  Abdomen:  soft, non-tender; bowel sounds normal; no masses,  no organomegaly  Extremities:   extremities normal, atraumatic, no cyanosis or edema  Neuro:  normal without focal findings    Assessment/Plan:  Natalie Wagner is an 19 year old female who presents with itchiness and redness within the umbilical area.  The skin on exam is noted to be eroded - etiology is unclear but may be due to contact irritation, obesity, or superficial skin infection - Mupirocin TID to affected area until symptoms resolve - Keep area clean; wash with mild soap - Discussed return precautions - Follow up in 1-2 weeks if symptoms persist or worsen .    Montel Clock, MD  07/16/2015

## 2015-07-16 NOTE — Patient Instructions (Signed)
Wash area daily with mild soap and water and gently pat dry with clean towel. Apply Bactroban ointment to area three times a day. If the area does not clear up in 1-2 weeks, or if you notice worsening redness or pus please return to clinic.

## 2015-07-25 ENCOUNTER — Ambulatory Visit (INDEPENDENT_AMBULATORY_CARE_PROVIDER_SITE_OTHER): Payer: Federal, State, Local not specified - Other | Admitting: Psychiatry

## 2015-07-25 ENCOUNTER — Encounter (HOSPITAL_COMMUNITY): Payer: Self-pay | Admitting: Psychiatry

## 2015-07-25 VITALS — BP 104/70 | HR 66 | Ht <= 58 in | Wt 143.4 lb

## 2015-07-25 DIAGNOSIS — E282 Polycystic ovarian syndrome: Secondary | ICD-10-CM

## 2015-07-25 DIAGNOSIS — F913 Oppositional defiant disorder: Secondary | ICD-10-CM

## 2015-07-25 DIAGNOSIS — R7309 Other abnormal glucose: Secondary | ICD-10-CM

## 2015-07-25 DIAGNOSIS — E669 Obesity, unspecified: Secondary | ICD-10-CM

## 2015-07-25 DIAGNOSIS — F201 Disorganized schizophrenia: Secondary | ICD-10-CM

## 2015-07-25 DIAGNOSIS — F203 Undifferentiated schizophrenia: Secondary | ICD-10-CM | POA: Diagnosis not present

## 2015-07-25 MED ORDER — OLANZAPINE 15 MG PO TBDP: 15.0000 mg | ORAL_TABLET | Freq: Every day | ORAL | Status: DC

## 2015-07-25 NOTE — Progress Notes (Signed)
Natalie Wagner Progress Note  Natalie Wagner [id number] 19 y.o.  07/25/2015 1:50 PM  Chief Complaint: Mom reports patient has been painting her face with a permanent marker   History of Present Illness: Patient seen for the first time by Dr. Salem Senate, she is a transfer from Dr. Ronnie Derby service.   Patient was seen on an emergency basis due to moms concern, mom reports that patient painted her face with permanent marker and was also found talking to someone named Natalie Wagner when there was no one around. And was acting strange, so Mom was concerned and brought her in.   Patient presents as a little silly and immature, states that she has been mostly compliant with her medication but mom suspects that she may have missed a few doses which could have led to her strange behavior.   Patient states that she sleeps good most nights but when she watches scary movies she has nightmares she was encouraged not to watch scary movies. Appetite is good she states her mood is good she tends to ruminate and worry about her future as she will be starting classes at [employer] for culinary arts, patient worries if she'll be able to keep up with other students or not.   Denies feeling hopeless or helpless, no suicidal or homicidal ideation was noted. No paranoia no hallucinations or delusions were present.   Patient states that she is not lactating but when she squeezes her nipples while showering she notices a little white discharge, calm out. Patient was told not to squeeze her nipples she stated understanding.   Her hemoglobin is 11.9 discussed taking an iron pill every day she stated understanding. Patient is tolerating her Zyprexa 15 mg well and overall his coping well. No psychosis was noted today.   Patient continues to workout at the gym and states that she has lost 3 pounds. Feels very happy about     Suicidal Ideation: No Plan Formed: No Patient has means to carry out plan: No  Homicidal  Ideation: No Plan Formed: No Patient has means to carry out plan: No  Review of Systems  Constitutional: Negative.  Negative for fever, weight loss and malaise/fatigue.  HENT: Negative.  Negative for congestion, ear discharge and sore throat.   Eyes: Negative for blurred vision, photophobia, discharge and redness.       Wears glasses  Respiratory: Negative.  Negative for cough and wheezing.   Cardiovascular: Negative.  Negative for chest pain, palpitations and orthopnea.  Gastrointestinal: Negative for heartburn, nausea, vomiting, abdominal pain, diarrhea and constipation.       Lactose intolerance  Genitourinary: Negative.  Negative for dysuria and urgency.  Musculoskeletal: Negative.  Negative for myalgias, back pain, joint pain, falls and neck pain.  Skin: Negative.  Negative for rash.  Neurological: Negative for dizziness, tingling, seizures, loss of consciousness, weakness and headaches.  Endo/Heme/Allergies: Negative for environmental allergies.  Psychiatric/Behavioral: Negative.  Negative for depression, suicidal ideas, hallucinations, memory loss and substance abuse. The patient is not nervous/anxious and does not have insomnia.     Past Medical Family, Social History: Patient is staying at home Family History  Problem Relation Age of Onset  . Asthma Mother   . Diabetes Maternal Grandfather   . Hypertension Maternal Grandfather   . Heart disease Maternal Grandfather   . Kidney disease Maternal Grandfather   . Anesthesia problems Maternal Grandfather     hx. of being hard to wake up post-op  . Hypertension Maternal Uncle   .  Hypertension Maternal Grandmother   . Stroke Maternal Grandmother    Past Medical History  Diagnosis Date  . Migraines   . Asthma     prn inhaler  . Schizophrenia   . History of seizures as a child     mother states were triggered by migraines; no seizures in > 7 yr.  . Difficulty swallowing pills   . Mass of finger of left hand 05/2014    tendon  sheath tumor ring finger  . Constipation 05/05/2013    Outpatient Encounter Prescriptions as of 07/25/2015  Medication Sig  . albuterol (PROAIR HFA) 108 (90 BASE) MCG/ACT inhaler Inhale 2 puffs into the lungs every 4 (four) hours as needed for wheezing or shortness of breath.  . cetirizine (ZYRTEC) 10 MG tablet Take 1 tablet (10 mg total) by mouth daily. For allergy symptoms  . hyoscyamine (LEVSIN SL) 0.125 MG SL tablet Take 0.125 mg by mouth as needed.   . metFORMIN (GLUCOPHAGE XR) 500 MG 24 hr tablet Take 3 tablets (1,500 mg total) by mouth daily with breakfast.  . metFORMIN (GLUCOPHAGE) 500 MG tablet TAKE 1 TABLET BY MOUTH EVERY DAY WITH BREAKFAST  . mupirocin ointment (BACTROBAN) 2 % Apply to affected area 3 times daily  . norgestimate-ethinyl estradiol (ORTHO-CYCLEN,SPRINTEC,PREVIFEM) 0.25-35 MG-MCG tablet Take 1 tablet by mouth daily.  Marland Kitchen olanzapine zydis (ZYPREXA) 15 MG disintegrating tablet Take 1 tablet (15 mg total) by mouth at bedtime.  Marland Kitchen olanzapine zydis (ZYPREXA) 15 MG disintegrating tablet TAKE 1 TABLET BY MOUTH EVERY DAY AT BEDTIME  . Vitamin D, Ergocalciferol, (DRISDOL) 50000 UNITS CAPS capsule Take 1 capsule (50,000 Units total) by mouth every 7 (seven) days.   No facility-administered encounter medications on file as of 07/25/2015.    Past Psychiatric History/Hospitalization(s): Anxiety: No Bipolar Disorder: No Depression: No Mania: No Psychosis: Yes Schizophrenia: Yes Personality Disorder: No Hospitalization for psychiatric illness: Yes History of Electroconvulsive Shock Therapy: No Prior Suicide Attempts: Yes  Physical Exam:AIMS score is 0 Constitutional: Last menstrual period 06/06/2015. General Appearance: alert, oriented, no acute distress and obese  Musculoskeletal: Strength & Muscle Tone: within normal limits Gait & Station: normal Patient leans: N/A Last menstrual period 06/06/2015. 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    SCHIZOPHRENIA- UNDIFFERENTIATED TYPE,   Oppositional defiant disorder  obesity,  borderline diabetes , polycystic ovarian disease     Plan Schizophrenia : Continue Zyprexa 15 mg one pill at bedtime for psychosis Diarrhea Continue a lactose-free diet as it seems to be helping patient with her diarrhea. Obesity Continue walking daily and making healthy choices in regards to food as patient has lost weight since her last visit. Labs  were done 2 weeks ago by her PCP and was reviewed with the patient. Low hemoglobin Patient was asked to start taking an iron pill every day. Continue to  follow-up with her primary care physician Call when necessary Follow-up in 3 months with Dr. Salem Senate or calls sooner if necessary.  This visit was of moderate intensity and exceeded 25 minutes. 50% of the time was spent in educating the patient about not painting her face with permanent markers but using. Take and colored cosmetic pencils that she can use. Also discussed not to squeeze her breasts to prevent the white discharge patient is not lactating.

## 2015-08-17 ENCOUNTER — Other Ambulatory Visit (HOSPITAL_COMMUNITY): Payer: Self-pay | Admitting: Psychiatry

## 2015-08-26 ENCOUNTER — Emergency Department (HOSPITAL_COMMUNITY)
Admission: EM | Admit: 2015-08-26 | Discharge: 2015-08-26 | Disposition: A | Discharge status: Home / Self Care | Payer: Medicaid Other | Attending: Emergency Medicine | Admitting: Emergency Medicine

## 2015-08-26 ENCOUNTER — Emergency Department (HOSPITAL_COMMUNITY): Payer: Medicaid Other

## 2015-08-26 ENCOUNTER — Encounter (HOSPITAL_COMMUNITY): Payer: Self-pay | Admitting: Emergency Medicine

## 2015-08-26 DIAGNOSIS — Z793 Long term (current) use of hormonal contraceptives: Secondary | ICD-10-CM | POA: Diagnosis not present

## 2015-08-26 DIAGNOSIS — R569 Unspecified convulsions: Secondary | ICD-10-CM | POA: Diagnosis not present

## 2015-08-26 DIAGNOSIS — Z79899 Other long term (current) drug therapy: Secondary | ICD-10-CM | POA: Insufficient documentation

## 2015-08-26 DIAGNOSIS — Z8739 Personal history of other diseases of the musculoskeletal system and connective tissue: Secondary | ICD-10-CM | POA: Insufficient documentation

## 2015-08-26 DIAGNOSIS — R51 Headache: Secondary | ICD-10-CM | POA: Insufficient documentation

## 2015-08-26 DIAGNOSIS — Z8679 Personal history of other diseases of the circulatory system: Secondary | ICD-10-CM | POA: Diagnosis not present

## 2015-08-26 DIAGNOSIS — Z8719 Personal history of other diseases of the digestive system: Secondary | ICD-10-CM | POA: Insufficient documentation

## 2015-08-26 DIAGNOSIS — Z3202 Encounter for pregnancy test, result negative: Secondary | ICD-10-CM | POA: Diagnosis not present

## 2015-08-26 DIAGNOSIS — J45909 Unspecified asthma, uncomplicated: Secondary | ICD-10-CM | POA: Insufficient documentation

## 2015-08-26 DIAGNOSIS — Z7984 Long term (current) use of oral hypoglycemic drugs: Secondary | ICD-10-CM | POA: Insufficient documentation

## 2015-08-26 DIAGNOSIS — F209 Schizophrenia, unspecified: Secondary | ICD-10-CM | POA: Diagnosis not present

## 2015-08-26 DIAGNOSIS — R55 Syncope and collapse: Secondary | ICD-10-CM | POA: Insufficient documentation

## 2015-08-26 LAB — BASIC METABOLIC PANEL
Anion gap: 9 (ref 5–15)
BUN: 10 mg/dL (ref 6–20)
CHLORIDE: 110 mmol/L (ref 101–111)
CO2: 22 mmol/L (ref 22–32)
Calcium: 9.4 mg/dL (ref 8.9–10.3)
Creatinine, Ser: 0.89 mg/dL (ref 0.44–1.00)
GFR calc Af Amer: >60 mL/min (ref >60)
GFR calc non Af Amer: >60 mL/min (ref >60)
Glucose, Bld: 99 mg/dL (ref 65–99)
POTASSIUM: 4.9 mmol/L (ref 3.5–5.1)
SODIUM: 141 mmol/L (ref 135–145)

## 2015-08-26 LAB — CBC
HEMATOCRIT: 37.3 % (ref 36.0–46.0)
HEMOGLOBIN: 11.7 g/dL — AB (ref 12.0–15.0)
MCH: 29.9 pg (ref 26.0–34.0)
MCHC: 31.4 g/dL (ref 30.0–36.0)
MCV: 95.4 fL (ref 78.0–100.0)
Platelets: 269 10*3/uL (ref 150–400)
RBC: 3.91 MIL/uL (ref 3.87–5.11)
RDW: 12.7 % (ref 11.5–15.5)
WBC: 7.2 10*3/uL (ref 4.0–10.5)

## 2015-08-26 LAB — CBG MONITORING, ED: Glucose-Capillary: 82 mg/dL (ref 65–99)

## 2015-08-26 LAB — I-STAT BETA HCG BLOOD, ED (MC, WL, AP ONLY): I-stat hCG, quantitative: <5 m[IU]/mL (ref <5)

## 2015-08-26 NOTE — ED Notes (Signed)
EKG complete given to EDP.

## 2015-08-26 NOTE — ED Notes (Signed)
History of seizures and today sitting on couch ems report family witnessed entire body tremors.  Upon arrival alert answering and following commands appropriate.

## 2015-08-26 NOTE — ED Notes (Signed)
EKG completed given to EDP at bedside.

## 2015-08-26 NOTE — Discharge Instructions (Signed)

## 2015-08-26 NOTE — ED Provider Notes (Signed)
CSN: [id number]     Arrival date & time 08/26/15  1003 History   First MD Initiated Contact with Patient 08/26/15 1004     Chief Complaint  Patient presents with  . Seizures     (Consider location/radiation/quality/duration/timing/severity/associated sxs/prior Treatment) Patient is a 19 y.o. female presenting with seizures.  Seizures Seizure activity on arrival: no   Seizure type:  Grand mal Preceding symptoms: no sensation of an aura present and no headache   Initial focality:  Unable to specify Episode characteristics: abnormal movements, generalized shaking and unresponsiveness   Episode characteristics: no focal shaking and no tongue biting   Postictal symptoms: somnolence   Return to baseline: yes   Severity:  Severe Duration:  3 minutes Timing:  Once Number of seizures this episode:  1 Progression:  Unchanged Context: not change in medication and not hydrocephalus   Recent head injury:  No recent head injuries PTA treatment:  None History of seizures: yes   Date of initial seizure episode:  Seizures as child, has not had for many years, saw Dr. Gaynell Face, thought to be secondary to migraines, was never started on aniteplieptics   Past Medical History  Diagnosis Date  . Migraines   . Asthma     prn inhaler  . Schizophrenia ([place])   . History of seizures as a child     mother states were triggered by migraines; no seizures in > 7 yr.  . Difficulty swallowing pills   . 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 Constipation 05/05/2013   Past Surgical History  Procedure Laterality Date  . Umbilical hernia repair    . Mri      under anesthesia  . Colonoscopy    . 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 Asthma Mother    . Diabetes Maternal Grandfather   . Hypertension Maternal Grandfather   . Heart disease Maternal Grandfather   . Kidney disease Maternal Grandfather   . Anesthesia problems Maternal Grandfather     hx. of being hard to wake up post-op  . Hypertension Maternal Uncle   . Hypertension Maternal Grandmother   . Stroke Maternal Grandmother    Social History  Substance Use Topics  . Smoking status: Never Smoker   . Smokeless tobacco: Never Used  . Alcohol Use: No   OB History    No data available     Review of Systems  Constitutional: Negative for fever.  HENT: Negative for sore throat.   Eyes: Negative for visual disturbance.  Respiratory: Negative for cough and shortness of breath.   Cardiovascular: Negative for chest pain.  Gastrointestinal: Negative for nausea, vomiting and abdominal pain.  Genitourinary: Negative for difficulty urinating.  Musculoskeletal: Negative for back pain and neck pain.  Skin: Negative for rash.  Neurological: Positive for seizures, syncope and headaches. Negative for dizziness, facial asymmetry, weakness, light-headedness and numbness.      Allergies  Lactose intolerance (gi)  Home Medications   Prior to Admission medications   Medication Sig Start Date End Date Taking? Authorizing Provider  albuterol (PROAIR HFA) 108 (90 BASE) MCG/ACT inhaler Inhale 2 puffs into the lungs every 4 (four) hours as needed for wheezing or shortness of breath.  07/08/15   Dominic Pea, MD  cetirizine (ZYRTEC) 10 MG tablet Take 1 tablet (10 mg total) by mouth daily. For allergy symptoms 07/08/15   Dominic Pea, MD  hyoscyamine (LEVSIN SL) 0.125 MG SL tablet Take 0.125 mg by mouth as needed.     Historical Provider, MD  metFORMIN (GLUCOPHAGE XR) 500 MG 24 hr tablet Take 3 tablets (1,500 mg total) by mouth daily with breakfast. 05/20/15   Trude Mcburney, FNP  metFORMIN (GLUCOPHAGE) 500 MG tablet TAKE 1 TABLET BY MOUTH EVERY DAY WITH BREAKFAST 05/06/15   Historical  Provider, MD  mupirocin ointment (BACTROBAN) 2 % Apply to affected area 3 times daily 07/16/15 07/15/16  Montel Clock, MD  norgestimate-ethinyl estradiol (ORTHO-CYCLEN,SPRINTEC,PREVIFEM) 0.25-35 MG-MCG tablet Take 1 tablet by mouth daily. 05/20/15   Trude Mcburney, FNP  olanzapine zydis (ZYPREXA) 15 MG disintegrating tablet Take 1 tablet (15 mg total) by mouth at bedtime. 04/25/15   Hampton Abbot, MD  olanzapine zydis (ZYPREXA) 15 MG disintegrating tablet Take 1 tablet (15 mg total) by mouth at bedtime. 07/25/15   Leonides Grills, MD  olanzapine zydis (ZYPREXA) 15 MG disintegrating tablet TAKE 1 TABLET BY MOUTH EVERY DAY AT BEDTIME 08/17/15   Hampton Abbot, MD  Vitamin D, Ergocalciferol, (DRISDOL) 50000 UNITS CAPS capsule Take 1 capsule (50,000 Units total) by mouth every 7 (seven) days. 12/19/14   Trude Mcburney, FNP   BP 116/61 mmHg  Pulse 54  Temp(Src) 98.3 F (36.8 C) (Oral)  Resp 16  Ht 5\' 6"  (1.676 m)  Wt 135 lb (61.236 kg)  BMI 21.80 kg/m2  SpO2 100%  LMP 08/11/2015 Physical Exam  Constitutional: She is oriented to person, place, and time. She appears well-developed and well-nourished. No distress.  HENT:  Head: Normocephalic and atraumatic.  Eyes: Conjunctivae and EOM are normal.  Neck: Normal range of motion.  Cardiovascular: Normal rate, regular rhythm, normal heart sounds and intact distal pulses.  Exam reveals no gallop and no friction rub.   No murmur heard. Pulmonary/Chest: Effort normal and breath sounds normal. No respiratory distress. She has no wheezes. She has no rales.  Abdominal: Soft. She exhibits no distension. There is no tenderness. There is no guarding.  Musculoskeletal: She exhibits no edema or tenderness.  Neurological: She is alert and oriented to person, place, and time. She has normal strength. No cranial nerve deficit or sensory deficit. GCS eye subscore is 4. GCS verbal subscore is 5. GCS motor subscore is 6.  Skin: Skin is warm and dry. No rash  noted. She is not diaphoretic. No erythema.  Nursing note and vitals reviewed.   ED Course  Procedures (including critical care time) Labs Review Labs Reviewed  CBC - Abnormal; Notable for the following:    Hemoglobin 11.7 (*)    All other components within normal limits  BASIC METABOLIC PANEL  CBG MONITORING, ED  I-STAT BETA HCG BLOOD, ED ([facility], [facility], [facility] ONLY)    Imaging Review Ct Head Wo Contrast  08/26/2015  CLINICAL DATA:  New onset seizure today.  Schizophrenia. EXAM: CT HEAD WITHOUT CONTRAST TECHNIQUE: Contiguous axial images were obtained from the base of the skull through the vertex without intravenous contrast. COMPARISON:  Brain MRI on 01/14/2015 FINDINGS: No evidence of intracranial hemorrhage, brain edema, or other signs of acute infarction. No evidence of intracranial mass lesion or mass effect. No abnormal extraaxial fluid collections identified. Ventricles are normal in size. No skull abnormality identified. IMPRESSION: Negative noncontrast head CT. Electronically Signed  By: Earle Gell M.D.   On: 08/26/2015 13:23   I have personally reviewed and evaluated these images and lab results as part of my medical decision-making.   EKG Interpretation None      MDM   Final diagnoses:  Seizure ([place])   19 year old female with a remote history of seizures with association with migraines for which she was never on antiepileptic therapy however some pediatric neurologist Dr. Doreene Nest, schizophrenia, presents with concern for seizure-like activity.  Mom reports she hit her head as she syncopized, CT head was done and showed no evidence of intracranial abnormality. Glucose is normal.  Pregnancy test is negative. Electrolytes are within normal limits. No clear illness or migraine brought on this episode.  EKG evaluated by me and shows sinus rhythm with no sign of prolonged QTc, no brugada, no sign of HOCM, no ST abnormalities. Given patient's history of seizure-like activity, feel this is  likely an exacerbation of the same. Recommend follow-up with an adult neurologist and provided number.  Discussed results, plan, and seizure precautions with family in detail.  Patient discharged in stable condition with understanding of reasons to return.     Gareth Morgan, MD 08/26/15 [phone]

## 2015-08-27 ENCOUNTER — Encounter: Payer: Self-pay | Admitting: Pediatrics

## 2015-08-27 ENCOUNTER — Ambulatory Visit (INDEPENDENT_AMBULATORY_CARE_PROVIDER_SITE_OTHER): Payer: Medicaid Other | Admitting: Pediatrics

## 2015-08-27 VITALS — BP 100/68 | Temp 97.9°F | Wt 137.4 lb

## 2015-08-27 DIAGNOSIS — K5909 Other constipation: Secondary | ICD-10-CM | POA: Diagnosis not present

## 2015-08-27 DIAGNOSIS — R339 Retention of urine, unspecified: Secondary | ICD-10-CM

## 2015-08-27 DIAGNOSIS — B3731 Acute candidiasis of vulva and vagina: Secondary | ICD-10-CM

## 2015-08-27 DIAGNOSIS — Z23 Encounter for immunization: Secondary | ICD-10-CM | POA: Diagnosis not present

## 2015-08-27 DIAGNOSIS — R300 Dysuria: Secondary | ICD-10-CM | POA: Diagnosis not present

## 2015-08-27 DIAGNOSIS — B373 Candidiasis of vulva and vagina: Secondary | ICD-10-CM

## 2015-08-27 LAB — POCT URINALYSIS DIPSTICK
BILIRUBIN UA: NEGATIVE
Blood, UA: NEGATIVE
Glucose, UA: NEGATIVE
LEUKOCYTES UA: NEGATIVE
NITRITE UA: NEGATIVE
PH UA: 5
PROTEIN UA: 30
Spec Grav, UA: 1.01
Urobilinogen, UA: 4

## 2015-08-27 MED ORDER — FLUCONAZOLE 150 MG PO TABS: 150.0000 mg | ORAL_TABLET | Freq: Once | ORAL | Status: DC

## 2015-08-27 NOTE — Progress Notes (Signed)
   Subjective:    Patient ID: [name], female    DOB: 1996-10-03, 19 y.o.   MRN: [id number]  HPI  CC: haven't peed since yesterday  # Urinary retention:  Says she last went at 1pm yesterday. She was in the ED yesterday for a seizure; distant history of seizures as a child but has never been put on medications.  drank 2 bottles of 547ml water yesterday, drank orange juice this morning.  Had tried to sit on toilet this morning but unable to go, otherwise doesn't have the urge to go to the bathroom.  Before yesterday had 2 days of burning with urination, no increased frequency.  Stopped metformin 1 week ago. Says it was causing her an upset stomach.  Last BM was 2-3 days ago. She has miralax at home but hasn't been taking it.  Having pain in lower abdomen, having cramps as well. Pain worse when pushing on her stomach.  Taking zyprexa once a day, denies taking any extra pills. Has been on this for a long time (in Epic at least as far back as 2012)  Denies any over the counter medicines, denies taking benadryl. Denies any other medication changes.   Had labial surgery in April, removed excess labial skin. Did not have issues with urination after this but says she has had a lot of health issues/ body is "weird" since then. ROS: no fevers, no vomiting.  PMH: Schizophrenia, IBS and constipation, PCOS, elevated prolactin level, acne  Review of Systems   See HPI for ROS.   Past medical history, surgical, family, and social history reviewed and updated in the EMR as appropriate. Objective:  BP 100/68 mmHg  Temp(Src) 97.9 F (36.6 C)  Wt 137 lb 6.4 oz (62.324 kg)  LMP 08/05/2015 (Approximate) Vitals and nursing note reviewed  General: NAD CV: RRR, normal heart sounds, no murmurs/rub/gallop. Resp: clear to auscultation bilaterally, no wheezes, normal effort Abdomen: soft, there is mild tenderness lower abdomen both sides and suprapubic but no rebound or guarding, tympanic  to percussion throughout, bowel sounds present Ext: no cyanosis, no lower extremity edema.  Neuro: alert and oriented, no deficits noted Skin: no rashes  Assessment & Plan:  1. Urinary retention Retention x 1 day after a grand mal seizure. By report she doesn't seem to have fluid challenged herself much. Unable to void in clinic, obtained cath sample to send for UA; minimal urine output from cath, UA negative for infection, some ketones. Suspect likely just mild dehydration in setting of recent seizure, differential would also include constipation, medication side effect (zyprexa has some anticholinergic properties). Encouraged to drink more water, treat yeast and constipation as below, follow up if still having issues or return to ED if after hours.  2. Dysuria / Yeast vaginitis Had negative pregnancy test in ED yesterday. Symptoms x 2-3 days. Negative UA. Vaginal discharge present consistent with yeast infection, rx diflucan below. Reviewed negative HIV/RPR/GC/Chlamydia testing from August. Follow up as needed. - POCT urinalysis dipstick - Diflucan 150mg  oral once, repeat in 3 days if needed  3. Other constipation In setting of stopping metformin last week. Counseled to restart metformin, okay to do clean out before restarting. Went over [facility] GI cleanout, printout given.   Next appointment at [facility] 11/14 with [facility]

## 2015-08-27 NOTE — Patient Instructions (Addendum)
Follow the bowel cleanout on the sheet we gave you.  Restart the metformin after doing the cleanout  Take the diflucan once for yeast infection, repeat in 3 days if still having discharge.

## 2015-08-27 NOTE — Progress Notes (Signed)
I discussed the patient with the resident and agree with the management plan that is described in the resident's note.  [place] Jr, MD  

## 2015-08-29 ENCOUNTER — Ambulatory Visit (HOSPITAL_COMMUNITY): Payer: Self-pay | Admitting: Psychiatry

## 2015-08-29 ENCOUNTER — Telehealth (HOSPITAL_COMMUNITY): Payer: Self-pay

## 2015-08-29 NOTE — Telephone Encounter (Signed)
Telephone call with patient's Mother to follow up on a message she had left requesting a call back. Ms. Rock Nephew wanted to makes sure patient has orders at her pharmacy for Zyprexa.  Verified Dr. Dwyane Dee had e-scribed in a new Zyprexa order on 08/17/15 plus one refill to [facility] on [address]. Ms. Rock Nephew to call back if any problems obtaining refills for patient.

## 2015-09-03 ENCOUNTER — Encounter: Payer: Self-pay | Admitting: Neurology

## 2015-09-03 ENCOUNTER — Telehealth: Payer: Self-pay | Admitting: Neurology

## 2015-09-03 ENCOUNTER — Ambulatory Visit (INDEPENDENT_AMBULATORY_CARE_PROVIDER_SITE_OTHER): Payer: Medicaid Other | Admitting: Neurology

## 2015-09-03 VITALS — BP 100/70 | HR 54 | Resp 14 | Wt 137.0 lb

## 2015-09-03 DIAGNOSIS — R569 Unspecified convulsions: Secondary | ICD-10-CM | POA: Diagnosis not present

## 2015-09-03 DIAGNOSIS — F203 Undifferentiated schizophrenia: Secondary | ICD-10-CM | POA: Diagnosis not present

## 2015-09-03 NOTE — Telephone Encounter (Signed)
Pt/mother called with questions about upcoming EEG appt 09/09/15 / call back @ : [phone]

## 2015-09-03 NOTE — Patient Instructions (Signed)
1. Schedule MRI brain with and without contrast 2. Schedule 1-hour sleep-deprived EEG. If normal, we will plan for a 24-hour EEG 3. As per [place] driving laws, one should not drive until 6 months seizure-free 4. Follow-up in 3-4 weeks, call for any changes

## 2015-09-03 NOTE — Progress Notes (Signed)
NEUROLOGY CONSULTATION NOTE  AFTYN NOTT MRN: [id number] DOB: [birth year]  Referring provider: Dr. Corinna Capra Primary care provider: Dr. Corinna Capra  Reason for consult:  seizure  Dear Dr Eddie Dibbles:  Thank you for your kind referral of Natalie Wagner for consultation of the above symptoms. Although her history is well known to you, please allow me to reiterate it for the purpose of our medical record. The patient was accompanied to the clinic by her mother who also provides collateral information. Records and images were personally reviewed where available.  HISTORY OF PRESENT ILLNESS: This is a 19 year old right-handed woman with a history of schizophrenia presenting for evaluation of seizures last 08/26/15. She recalls waking up then starting to feel dizzy and lightheaded, then heard her mother screaming and waking up on the couch. Her mother reports she lost consciousness and started shaking, rocking side to side. No tongue bite or incontinence. She was brought to [facility] ER where CBC and BMP were normal, no urine drug screen done. I personally reviewed head CT without contrast which was normal. Her mother reports she was born premature at 58 weeks and had delayed development with occupation and speech therapy as a child. She was in special education classes. As a baby, she would have episodes where she would get stiff as a board and shake, then in the 2nd grade she passed out with shaking and incontinence. She also had brief staring spells occurring around 3 times a week. She had seen pediatric neurologist Dr. Gaynell Face at that time, mother is unsure of diagnosis but denies any treatment for seizures. There is note that symptoms were felt to be due to migraines. The last staring spell was in the 7th grade. She was diagnosed with schizophrenia in the 9th grade, she was having visual hallucinations and would forget who she was, suddenly drinking from a fountain and spitting it out. She was  admitted to inpatient psychiatry for 2 weeks and has been taking Zyprexa for the past 3 years. She denies any more hallucinations, but her mother is concerned about "strange behaviors" the past month. In the waiting room today, she was staring at the form and her mother told her to write something down. She then wrote over what her mother had filled out. She talks as if there is a third person in the room, sometimes she "just talks and does not know it." She closes and opens doors repetitively. She denies any myoclonic jerks, focal numbness/tingling/weakness, rising epigastric sensation. She used to have bad headaches, but now occur only when she is tired. She denies any dizziness, diplopia, dysarthria/dysphagia, nausea/vomiting, photo/phonophobia, neck/back pain, bowel/bladder dysfunction. She lives with her mother and denies any alcohol intake. She reports good sleep.   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 There is no history of febrile convulsions, CNS infections such as meningitis/encephalitis, significant traumatic brain injury, neurosurgical procedures, or family history of seizures.  MRI brain with and without contrast done 01/14/15 for galactorrhea. I personally reviewed images, no acute changes, hippocampi symmetric with no abnormal signal or enhancement seen. Pituitary gland normal.   PAST MEDICAL HISTORY: Past Medical History  Diagnosis Date  . Migraines   . Asthma     prn inhaler  . Schizophrenia ([place])   . History of seizures as a child     mother states were triggered by migraines; no seizures in > 7 yr.  . Difficulty swallowing pills   . Mass  of finger of left hand 05/2014    tendon sheath tumor ring finger  . Constipation 05/05/2013    PAST SURGICAL HISTORY: Past Surgical History  Procedure Laterality Date  . Umbilical hernia repair    . Mri      under anesthesia  . Colonoscopy    . 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 cetirizine (ZYRTEC) 10 MG tablet Take 1 tablet (10 mg total) by mouth daily. For allergy symptoms (Patient taking differently: Take 10 mg by mouth as needed. For allergy symptoms) 30 tablet 11  . hyoscyamine (LEVSIN SL) 0.125 MG SL tablet Take 0.125 mg by mouth as needed.     . norgestimate-ethinyl estradiol (ORTHO-CYCLEN,SPRINTEC,PREVIFEM) 0.25-35 MG-MCG tablet Take 1 tablet by mouth daily. 1 Package 11  . olanzapine zydis (ZYPREXA) 15 MG disintegrating tablet TAKE 1 TABLET BY MOUTH EVERY DAY AT BEDTIME 30 tablet 1  . albuterol (PROAIR HFA) 108 (90 BASE) MCG/ACT inhaler Inhale 2 puffs into the lungs every 4 (four) hours as needed for wheezing or shortness of breath. (Patient not taking: Reported on 08/27/2015) 2 Inhaler 11  . metFORMIN (GLUCOPHAGE) 500 MG tablet TAKE 1 TABLET BY MOUTH EVERY DAY WITH BREAKFAST  0  . Vitamin D, Ergocalciferol, (DRISDOL) 50000 UNITS CAPS capsule Take 1 capsule (50,000 Units total) by mouth every 7 (seven) days. (Patient not taking: Reported on 08/27/2015) 12 capsule 0   No current facility-administered medications on file prior to visit.    ALLERGIES: Allergies  Allergen Reactions  . Lactose Intolerance (Gi) Diarrhea    FAMILY HISTORY: Family History  Problem Relation Age of Onset  . Asthma Mother   . Diabetes Maternal Grandfather   . Hypertension Maternal Grandfather   . Heart disease Maternal Grandfather   . Kidney disease Maternal Grandfather   . Anesthesia problems Maternal Grandfather     hx. of being hard to wake up post-op  . Hypertension Maternal Uncle   . Hypertension Maternal Grandmother   . Stroke Maternal Grandmother     SOCIAL HISTORY: Social History   Social History  .  Marital Status: Single    Spouse Name: N/A  . Number of Children: N/A  . Years of Education: N/A   Occupational History  . Not on file.   Social History Main Topics  . Smoking status: Never Smoker   . Smokeless tobacco: Never Used  . Alcohol Use: No  . Drug Use: No  . Sexual Activity: No   Other Topics Concern  . Not on file   Social History Narrative    REVIEW OF SYSTEMS: Constitutional: No fevers, chills, or sweats, no generalized fatigue, change in appetite Eyes: No visual changes, double vision, eye pain Ear, nose and throat: No hearing loss, ear pain, nasal congestion, sore throat Cardiovascular: No chest pain, palpitations Respiratory:  No shortness of breath at rest or with exertion, wheezes GastrointestinaI: No nausea, vomiting, diarrhea, abdominal pain, fecal incontinence Genitourinary:  No dysuria, urinary retention or frequency Musculoskeletal:  No neck pain, back pain Integumentary: No rash, pruritus, skin lesions Neurological: as above Psychiatric: No depression, insomnia, anxiety Endocrine: No palpitations, fatigue, diaphoresis, mood swings, change in appetite, change in weight, increased thirst Hematologic/Lymphatic:  No anemia, purpura, petechiae.  Allergic/Immunologic: no itchy/runny eyes, nasal congestion, recent allergic reactions, rashes  PHYSICAL EXAM: Filed Vitals:   09/03/15 1241  BP: 100/70  Pulse: 54  Resp: 14   General: No acute distress Head:  Normocephalic/atraumatic Eyes: Fundoscopic exam shows bilateral sharp discs, no vessel changes, exudates, or hemorrhages Neck: supple, no paraspinal tenderness, full range of motion Back: No paraspinal tenderness Heart: regular rate and rhythm Lungs: Clear to auscultation bilaterally. Vascular: No carotid bruits. Skin/Extremities: No rash, no edema Neurological Exam: Mental status: alert and oriented to person, place, and time, no dysarthria or aphasia, Fund of knowledge is appropriate.  Recent and  remote memory are intact.  Attention and concentration are normal.    Able to name objects and repeat phrases. Cranial nerves: CN I: not tested CN II: pupils equal, round and reactive to light, visual fields intact, fundi unremarkable. CN III, IV, VI:  full range of motion, no nystagmus, no ptosis CN V: facial sensation intact CN VII: upper and lower face symmetric CN VIII: hearing intact to finger rub CN IX, X: gag intact, uvula midline CN XI: sternocleidomastoid and trapezius muscles intact CN XII: tongue midline Bulk & Tone: normal, no fasciculations. Motor: 5/5 throughout with no pronator drift. Sensation: intact to light touch, cold, pin, vibration and joint position sense.  No extinction to double simultaneous stimulation.  Romberg test negative Deep Tendon Reflexes: +1 throughout, no ankle clonus Plantar responses: downgoing bilaterally Cerebellar: no incoordination on finger to nose, heel to shin. No dysdiadochokinesia Gait: narrow-based and steady, able to tandem walk adequately. Tremor: none  IMPRESSION: This is a 19 year old right-handed woman with a history of schizophrenia, presenting for evaluation of seizures. Her mother witnessed a convulsion last 08/26/15, and reports episodes concerning for possible seizures as a child (staring spells, stiffening episodes). MRI brain with and without contrast and 1-hour sleep-deprived EEG will be ordered to assess for focal abnormalities that increase risk for seizures. If routine EEG normal, we will plan for a 24-hour EEG to further classify her symptoms. [place] driving laws were discussed with the patient, and she knows to stop driving after a seizure, until 6 months seizure-free. She will follow-up after the tests and knows to call our office for any changes in the interim.   Thank you for allowing me to participate in the care of this patient. Please do not hesitate to call for any questions or concerns.   Ellouise Newer, M.D.  CC: Dr.  Eddie Dibbles

## 2015-09-09 ENCOUNTER — Ambulatory Visit (INDEPENDENT_AMBULATORY_CARE_PROVIDER_SITE_OTHER): Payer: Medicaid Other | Admitting: Neurology

## 2015-09-09 ENCOUNTER — Encounter: Payer: Self-pay | Admitting: Neurology

## 2015-09-09 DIAGNOSIS — R569 Unspecified convulsions: Secondary | ICD-10-CM

## 2015-09-09 NOTE — Addendum Note (Signed)
Addended by: Corinna Capra C on: 09/09/2015 03:09 PM   Modules accepted: Level of Service

## 2015-09-10 ENCOUNTER — Telehealth: Payer: Self-pay | Admitting: Family Medicine

## 2015-09-10 DIAGNOSIS — R569 Unspecified convulsions: Secondary | ICD-10-CM

## 2015-09-10 NOTE — Procedures (Signed)
ELECTROENCEPHALOGRAM REPORT  Date of Study: 09/09/2015  Patient's Name: Natalie Wagner MRN: [id number] Date of Birth: 24-Feb-1996  Referring Provider: Dr. Ellouise Newer  Clinical History: This is a 19 year old woman with a history of schizophrenia and possible seizures as a child (staring spells, stiffening), who had a witnessed a convulsion last 08/26/15  Medications: Zyrtec, LevinSL, Ortho-cyclan, Sprintec, Previfem, Zyprexa, Albuterol, Glucophage, Drisdol  Technical Summary: A multichannel digital 1-hour sleep-deprived EEG recording measured by the international 10-20 system with electrodes applied with paste and impedances below 5000 ohms performed in our laboratory with EKG monitoring in an awake and asleep patient.  Hyperventilation and photic stimulation were performed.  The digital EEG was referentially recorded, reformatted, and digitally filtered in a variety of bipolar and referential montages for optimal display.    Description: The patient is awake and asleep during the recording.  During maximal wakefulness, there is a symmetric, medium voltage 10 Hz posterior dominant rhythm that attenuates with eye opening.  The record is symmetric.  During drowsiness and sleep, there is an increase in theta slowing of the background.  Vertex waves and symmetric sleep spindles were seen.  Hyperventilation and photic stimulation did not elicit any abnormalities.  There were no epileptiform discharges or electrographic seizures seen.    EKG lead was unremarkable.  Impression: This 1-hour sleep-deprived awake and asleep EEG is normal.    Clinical Correlation: A normal EEG does not exclude a clinical diagnosis of epilepsy.  If further clinical questions remain, prolonged EEG may be helpful.  Clinical correlation is advised.   Ellouise Newer, M.D.

## 2015-09-10 NOTE — Telephone Encounter (Signed)
Patients mother was notified of result. Will send Manuela Schwartz a msg to call her and schedule 24 hour eeg. I did make her mother aware that if 24 hour eeg is scheduled after patient's scheduled f/u appt on 11/28 then whe would need to move patient's appt back until after she has the 24 hour eeg.

## 2015-09-10 NOTE — Telephone Encounter (Signed)
-----   Message from Cameron Sprang, MD sent at 09/10/2015  9:46 AM EDT ----- Pls let pt/mother know EEG is normal, would proceed with 24-hour EEG as discussed. thanks

## 2015-09-18 ENCOUNTER — Ambulatory Visit (HOSPITAL_COMMUNITY): Admission: RE | Admit: 2015-09-18 | Payer: Medicaid Other | Source: Ambulatory Visit

## 2015-09-23 ENCOUNTER — Encounter: Payer: Self-pay | Admitting: Family

## 2015-09-23 ENCOUNTER — Ambulatory Visit (INDEPENDENT_AMBULATORY_CARE_PROVIDER_SITE_OTHER): Payer: Medicaid Other | Admitting: Family

## 2015-09-23 VITALS — BP 114/70 | HR 77 | Ht 58.47 in | Wt 132.4 lb

## 2015-09-23 DIAGNOSIS — Z131 Encounter for screening for diabetes mellitus: Secondary | ICD-10-CM | POA: Diagnosis not present

## 2015-09-23 DIAGNOSIS — L700 Acne vulgaris: Secondary | ICD-10-CM

## 2015-09-23 DIAGNOSIS — E282 Polycystic ovarian syndrome: Secondary | ICD-10-CM

## 2015-09-23 LAB — POCT GLYCOSYLATED HEMOGLOBIN (HGB A1C): Hemoglobin A1C: 5.3

## 2015-09-23 MED ORDER — BENZACLIN 1-5 % EX GEL: CUTANEOUS | Status: DC

## 2015-09-23 MED ORDER — METFORMIN HCL 500 MG PO TABS: 500.0000 mg | ORAL_TABLET | Freq: Every day | ORAL | Status: DC

## 2015-09-23 NOTE — Progress Notes (Addendum)
THIS RECORD MAY CONTAIN CONFIDENTIAL INFORMATION THAT SHOULD NOT BE RELEASED WITHOUT REVIEW OF THE SERVICE PROVIDER.  Adolescent Medicine Consultation Follow-Up Visit Natalie Wagner  is a 19 y.o. female referred by Dominic Pea, MD here today for follow-up.    My Chart Activated?   no   Previsit planning completed:  Yes Patient ID: Natalie Wagner, female   DOB: Sep 01, 1996, 19 y.o.   MRN: [id number]  Pre-Visit Planning  Natalie Wagner  is a 19 y.o. female referred by Dominic Pea, MD.   Last seen in [facility] on 07/02/15 for PCOS.   Previous Psych Screenings?  No   Treatment plan at last visit included Reinitiate Metformin, watch for GI upset; continue with Sprintec. Ovasitol was discussed as a supplement, not substitute for exisiting treatment plan.   Clinical Staff Visit Tasks:   - Urine GC/CT due? No, negative screen on 07/08/15 - Psych Screenings Due? No - POCT Hgb A1C  Provider Visit Tasks: - Assess PCOS symptoms, medication compliance - Pertinent Labs? Yes  Lab Results  Component Value Date   HGBA1C 5.3 09/23/2015     Growth Chart Viewed? no   History was provided by the patient and mother.  PCP Confirmed?  Yes, Natalie Capra, MD   HPI:    -she was seen by Neurology on 09/09/15 for evaluation of convulsion on 08/26/15. She is to be seen by Neuro again on 09/23/15 for additional testing to r/o epilepsy.  -she has not taken Metformin since the event on 08/26/15; taking Sprintec daily as prescribed.  -periods are regular; she complains of acne on face and chest; also reports increased hair growth on chin and chest.  -She tried a face wash before with some benefit. She has some concerns about pill swallowing.  -  Patient's last menstrual period was 09/08/2015. Allergies  Allergen Reactions  . Lactose Intolerance (Gi) Diarrhea   Current Outpatient Prescriptions on File Prior to Visit  Medication Sig Dispense Refill  . albuterol (PROAIR  HFA) 108 (90 BASE) MCG/ACT inhaler Inhale 2 puffs into the lungs every 4 (four) hours as needed for wheezing or shortness of breath. 2 Inhaler 11  . cetirizine (ZYRTEC) 10 MG tablet Take 1 tablet (10 mg total) by mouth daily. For allergy symptoms (Patient taking differently: Take 10 mg by mouth as needed. For allergy symptoms) 30 tablet 11  . hyoscyamine (LEVSIN SL) 0.125 MG SL tablet Take 0.125 mg by mouth as needed.     . metFORMIN (GLUCOPHAGE) 500 MG tablet TAKE 1 TABLET BY MOUTH EVERY DAY WITH BREAKFAST  0  . norgestimate-ethinyl estradiol (ORTHO-CYCLEN,SPRINTEC,PREVIFEM) 0.25-35 MG-MCG tablet Take 1 tablet by mouth daily. 1 Package 11  . olanzapine zydis (ZYPREXA) 15 MG disintegrating tablet TAKE 1 TABLET BY MOUTH EVERY DAY AT BEDTIME 30 tablet 1  . Vitamin D, Ergocalciferol, (DRISDOL) 50000 UNITS CAPS capsule Take 1 capsule (50,000 Units total) by mouth every 7 (seven) days. 12 capsule 0   No current facility-administered medications on file prior to visit.    Confidentiality was discussed with the patient and if applicable, with caregiver as well.  Patient's personal or confidential phone number: on file    The following portions of the patient's history were reviewed and updated as appropriate: allergies, current medications, past family history, past medical history, past social history, past surgical history and problem list.  Review of Systems  Constitutional: Negative.   HENT: Negative.   Eyes: Negative.   Respiratory: Negative.   Cardiovascular:  Negative.   Gastrointestinal: Negative.   Genitourinary: Negative.   Musculoskeletal: Negative.   Skin: Negative.   Neurological: Negative.   Endo/Heme/Allergies: Negative.   Psychiatric/Behavioral: Negative.      Physical Exam:  Filed Vitals:   09/23/15 1047  BP: 114/70  Pulse: 77  Height: 4' 10.47" (1.485 m)  Weight: 132 lb 6.4 oz (60.056 kg)   BP 114/70 mmHg  Pulse 77  Ht 4' 10.47" (1.485 m)  Wt 132 lb 6.4 oz  (60.056 kg)  BMI 27.23 kg/m2  LMP 09/08/2015 Body mass index: body mass index is 27.23 kg/(m^2). Blood pressure percentiles are [id number] systolic and [id number] diastolic based on [id number] NHANES data. Blood pressure percentile targets: 90: 120/77, 95: 124/81, 99 + 5 mmHg: 136/94.    Wt Readings from Last 3 Encounters:  09/23/15 132 lb 6.4 oz (60.056 kg) (60 %*, Z = 0.26)  09/03/15 137 lb (62.143 kg) (68 %*, Z = 0.46)  08/27/15 137 lb 6.4 oz (62.324 kg) (68 %*, Z = 0.47)   * Growth percentiles are based on CDC 2-20 Years data.     Physical Exam  Constitutional: She is oriented to person, place, and time. She appears well-developed. No distress.  HENT:  Head: Normocephalic and atraumatic.  Eyes: EOM are normal. Pupils are equal, round, and reactive to light. No scleral icterus.  Neck: Normal range of motion. Neck supple. No thyromegaly present.  Cardiovascular: Normal rate, regular rhythm, normal heart sounds and intact distal pulses.   No murmur heard. Pulmonary/Chest: Effort normal and breath sounds normal.  Abdominal: Soft.  Musculoskeletal: Normal range of motion. She exhibits no edema.  Lymphadenopathy:    She has no cervical adenopathy.  Neurological: She is alert and oriented to person, place, and time. No cranial nerve deficit.  Skin: Skin is warm and dry. No rash noted.  Acne on forehead, cheeks, chest  Coarse hairs on chin, chest    Psychiatric: She has a normal mood and affect. Her behavior is normal. Judgment and thought content normal.  Vitals reviewed.    Assessment/Plan: 1. PCOS (polycystic ovarian syndrome) -Weight loss going well; tolerating Metformin with no AEs.  -continue current  - metFORMIN (GLUCOPHAGE) 500 MG tablet; Take 1 tablet (500 mg total) by mouth daily with breakfast.  Dispense: 30 tablet; Refill: 3   PCOS Labs & Referrals:   - Hgba1c annually if normal, every 3 months if abnormal:  Due 12/2015 - CMP annually if normal, as needed if abnormal:  Due  12/2015 - CBC if on metformin, annually if normal, as needed if abnormal:  Due 08/2016 - Lipid every 2 years if normal, annually if abnormal:  Due 06/2017 - Vitamin D annually if normal, as needed if abnormal: Due 06/2016 - Nutrition referral: deferred - BH Screening: consider at next OV     2. Acne vulgaris -discussed Spironolactone: unlikely  - BENZACLIN gel; Apply topically every morning.  Dispense: 25 g; Refill: 11  3. Screening for diabetes mellitus  Lab Results  Component Value Date   HGBA1C 5.3 09/23/2015   - POCT glycosylated hemoglobin (Hb A1C)   Follow-up:  Return in about 3 months (around 12/24/2015) for with any [facility] provider, PCOS management.  Call in 2-3 weeks to let us know how   Medical decision-making:  > 25 minutes spent, more than 50% of appointment was spent discussing diagnosis and management of symptoms

## 2015-09-23 NOTE — Patient Instructions (Addendum)
  Please call in 2-3 weeks to let me know how the acne gel is working for you. Call before if any questions.

## 2015-09-23 NOTE — Progress Notes (Signed)
Patient ID: Natalie Wagner, female   DOB: Oct 16, 1996, 19 y.o.   MRN: [id number]  Pre-Visit Planning  Natalie Wagner  is a 19 y.o. female referred by Dominic Pea, MD.   Last seen in [facility] on 07/02/15 for PCOS.   Previous Psych Screenings?  No   Treatment plan at last visit included Reinitiate Metformin, watch for GI upset; continue with Sprintec. Ovasitol was discussed as a supplement, not substitute for exisiting treatment plan.   Clinical Staff Visit Tasks:   - Urine GC/CT due? No, negative screen on 07/08/15 - Psych Screenings Due? No - POCT Hgb A1C  Provider Visit Tasks: - Assess PCOS symptoms, medication compliance - Pertinent Labs? Yes  Lab Results  Component Value Date   HGBA1C 5.4 05/20/2015

## 2015-09-25 ENCOUNTER — Ambulatory Visit (INDEPENDENT_AMBULATORY_CARE_PROVIDER_SITE_OTHER): Payer: Medicaid Other | Admitting: Neurology

## 2015-09-25 DIAGNOSIS — R569 Unspecified convulsions: Secondary | ICD-10-CM | POA: Diagnosis not present

## 2015-10-02 ENCOUNTER — Ambulatory Visit (HOSPITAL_COMMUNITY): Admission: RE | Admit: 2015-10-02 | Payer: Medicaid Other | Source: Ambulatory Visit

## 2015-10-07 ENCOUNTER — Encounter: Payer: Self-pay | Admitting: Neurology

## 2015-10-07 ENCOUNTER — Ambulatory Visit (INDEPENDENT_AMBULATORY_CARE_PROVIDER_SITE_OTHER): Payer: Medicaid Other | Admitting: Neurology

## 2015-10-07 ENCOUNTER — Ambulatory Visit (HOSPITAL_COMMUNITY): Payer: Self-pay

## 2015-10-07 VITALS — BP 118/74 | HR 65 | Resp 16 | Wt 133.0 lb

## 2015-10-07 DIAGNOSIS — F203 Undifferentiated schizophrenia: Secondary | ICD-10-CM | POA: Diagnosis not present

## 2015-10-07 DIAGNOSIS — R569 Unspecified convulsions: Secondary | ICD-10-CM | POA: Diagnosis not present

## 2015-10-07 NOTE — Patient Instructions (Signed)
1. Proceed with MRI brain as scheduled 2. Follow-up in 4 months, call for any changes  Seizure Precautions: 1. If medication has been prescribed for you to prevent seizures, take it exactly as directed.  Do not stop taking the medicine without talking to your doctor first, even if you have not had a seizure in a long time.   2. Avoid activities in which a seizure would cause danger to yourself or to others.  Don't operate dangerous machinery, swim alone, or climb in high or dangerous places, such as on ladders, roofs, or girders.  Do not drive unless your doctor says you may.  3. If you have any warning that you may have a seizure, lay down in a safe place where you can't hurt yourself.    4.  No driving for 6 months from last seizure, as per [facility].   Please refer to the following link on the [place] website for more information: http://www.epilepsyfoundation.org/answerplace/Social/driving/drivingu.cfm   5.  Maintain good sleep hygiene. Avoid alcohol  6.  Notify your neurology if you are planning pregnancy or if you become pregnant.  7.  Contact your doctor if you have any problems that may be related to the medicine you are taking.  8.  Call 911 and bring the patient back to the ED if:        A.  The seizure lasts longer than 5 minutes.       B.  The patient doesn't awaken shortly after the seizure  C.  The patient has new problems such as difficulty seeing, speaking or moving  D.  The patient was injured during the seizure  E.  The patient has a temperature over 102 F (39C)  F.  The patient vomited and now is having trouble breathing

## 2015-10-07 NOTE — Progress Notes (Signed)
NEUROLOGY FOLLOW UP OFFICE NOTE  Natalie Wagner [id number]  HISTORY OF PRESENT ILLNESS: I had the pleasure of seeing Natalie Wagner in follow-up in the neurology clinic on 10/07/2015. She is again accompanied by her mother who helps supplement the history today. The patient was last seen a month ago for new onset seizure on 08/26/15. Her mother reported concern for seizures as an infant, but she was never started on seizure medication. Records and images were personally reviewed where available. Her 1-hour sleep-deprived EEG, as well as 24-hour EEG were normal. Typical events were not captured. She has missed her MRI brain appointments and has it scheduled next month. They deny any convulsions since 08/26/15. Her mother still notices repetitive behavior at home (repeatedly opening and closing her bedroom door).  She denies any headaches, dizziness, focal numbness/tingling/weakness, falls.  HPI: This is a 19 yo RH woman with a history of schizophrenia who presented after a seizure last 08/26/15. She recalls waking up then starting to feel dizzy and lightheaded, then heard her mother screaming and waking up on the couch. Her mother reports she lost consciousness and started shaking, rocking side to side. No tongue bite or incontinence. She was brought to [facility] ER where CBC and BMP were normal, no urine drug screen done. I personally reviewed head CT without contrast which was normal. Her mother reports she was born premature at 57 weeks and had delayed development with occupation and speech therapy as a child. She was in special education classes. As a baby, she would have episodes where she would get stiff as a board and shake, then in the 2nd grade she passed out with shaking and incontinence. She also had brief staring spells occurring around 3 times a week. She had seen pediatric neurologist Dr. Gaynell Face at that time, mother is unsure of diagnosis but denies any treatment for seizures. There is note  that symptoms were felt to be due to migraines. The last staring spell was in the 7th grade. She was diagnosed with schizophrenia in the 9th grade, she was having visual hallucinations and would forget who she was, suddenly drinking from a fountain and spitting it out. She was admitted to inpatient psychiatry for 2 weeks and has been taking Zyprexa for the past 3 years. She denies any more hallucinations, but her mother is concerned about "strange behaviors" the past month. In the waiting room today, she was staring at the form and her mother told her to write something down. She then wrote over what her mother had filled out. She talks as if there is a third person in the room, sometimes she "just talks and does not know it." She closes and opens doors repetitively. She lives with her mother and denies any alcohol intake. She reports good sleep.   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 There is no history of febrile convulsions, CNS infections such as meningitis/encephalitis, significant traumatic brain injury, neurosurgical procedures, or family history of seizures.  Diagnostic Data: MRI brain with and without contrast done 01/14/15 for galactorrhea. I personally reviewed images, no acute changes, hippocampi symmetric with no abnormal signal or enhancement seen. Pituitary gland normal. EEG as above   PAST MEDICAL HISTORY: Past Medical History  Diagnosis Date  . Migraines   . Asthma     prn inhaler  . Schizophrenia ([place])   . History of seizures as a child     mother states were triggered by migraines; no seizures in >  7 yr.  . Difficulty swallowing pills   . 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 Constipation 05/05/2013    MEDICATIONS: Current Outpatient Prescriptions on File Prior to Visit  Medication Sig Dispense Refill  . albuterol (PROAIR HFA) 108 (90 BASE) MCG/ACT inhaler Inhale 2 puffs into the lungs every 4 (four)  hours as needed for wheezing or shortness of breath. 2 Inhaler 11  . BENZACLIN gel Apply topically every morning. 25 g 11  . cetirizine (ZYRTEC) 10 MG tablet Take 1 tablet (10 mg total) by mouth daily. For allergy symptoms (Patient taking differently: Take 10 mg by mouth as needed. For allergy symptoms) 30 tablet 11  . hyoscyamine (LEVSIN SL) 0.125 MG SL tablet Take 0.125 mg by mouth as needed.     . metFORMIN (GLUCOPHAGE) 500 MG tablet Take 1 tablet (500 mg total) by mouth daily with breakfast. 30 tablet 3  . norgestimate-ethinyl estradiol (ORTHO-CYCLEN,SPRINTEC,PREVIFEM) 0.25-35 MG-MCG tablet Take 1 tablet by mouth daily. 1 Package 11  . olanzapine zydis (ZYPREXA) 15 MG disintegrating tablet TAKE 1 TABLET BY MOUTH EVERY DAY AT BEDTIME 30 tablet 1  . Vitamin D, Ergocalciferol, (DRISDOL) 50000 UNITS CAPS capsule Take 1 capsule (50,000 Units total) by mouth every 7 (seven) days. 12 capsule 0   No current facility-administered medications on file prior to visit.    ALLERGIES: Allergies  Allergen Reactions  . Lactose Intolerance (Gi) Diarrhea    FAMILY HISTORY: Family History  Problem Relation Age of Onset  . Asthma Mother   . Diabetes Maternal Grandfather   . Hypertension Maternal Grandfather   . Heart disease Maternal Grandfather   . Kidney disease Maternal Grandfather   . Anesthesia problems Maternal Grandfather     hx. of being hard to wake up post-op  . Hypertension Maternal Uncle   . Hypertension Maternal Grandmother   . Stroke Maternal Grandmother     SOCIAL HISTORY: Social History   Social History  . Marital Status: Single    Spouse Name: N/A  . Number of Children: N/A  . Years of Education: N/A   Occupational History  . Not on file.   Social History Main Topics  . Smoking status: Never Smoker   . Smokeless tobacco: Never Used  . Alcohol Use: No  . Drug Use: No  . Sexual Activity: No   Other Topics Concern  . Not on file   Social History Narrative     REVIEW OF SYSTEMS: Constitutional: No fevers, chills, or sweats, no generalized fatigue, change in appetite Eyes: No visual changes, double vision, eye pain Ear, nose and throat: No hearing loss, ear pain, nasal congestion, sore throat Cardiovascular: No chest pain, palpitations Respiratory:  No shortness of breath at rest or with exertion, wheezes GastrointestinaI: No nausea, vomiting, diarrhea, abdominal pain, fecal incontinence Genitourinary:  No dysuria, urinary retention or frequency Musculoskeletal:  No neck pain, back pain Integumentary: No rash, pruritus, skin lesions Neurological: as above Psychiatric: No depression, insomnia, anxiety Endocrine: No palpitations, fatigue, diaphoresis, mood swings, change in appetite, change in weight, increased thirst Hematologic/Lymphatic:  No anemia, purpura, petechiae. Allergic/Immunologic: no itchy/runny eyes, nasal congestion, recent allergic reactions, rashes  PHYSICAL EXAM: Filed Vitals:   10/07/15 1133  BP: 118/74  Pulse: 65  Resp: 16   General: No acute distress Head:  Normocephalic/atraumatic Neck: supple, no paraspinal tenderness, full range of motion Heart:  Regular rate and rhythm Lungs:  Clear to auscultation bilaterally Back: No  paraspinal tenderness Skin/Extremities: No rash, no edema Neurological Exam: alert and oriented to person, place, and time. No aphasia or dysarthria. Fund of knowledge is appropriate.  Recent and remote memory are intact.  Attention and concentration are normal.    Able to name objects and repeat phrases. Cranial nerves: Pupils equal, round, reactive to light.  Fundoscopic exam unremarkable, no papilledema. Extraocular movements intact with no nystagmus. Visual fields full. Facial sensation intact. No facial asymmetry. Tongue, uvula, palate midline.  Motor: Bulk and tone normal, muscle strength 5/5 throughout with no pronator drift.  Sensation to light touch intact.  No extinction to double  simultaneous stimulation.  Deep tendon reflexes 2+ throughout, toes downgoing.  Finger to nose testing intact.  Gait narrow-based and steady, able to tandem walk adequately.  Romberg negative.  IMPRESSION: This is a 19 yo RH woman with a history of schizophrenia, with a witnessed convulsion last 08/26/15. Her mother reported episodes concerning for possible seizures as a child (staring spells, stiffening episodes), however she was never started on seizure medication by pediatric neurology. Her 24-hour EEG is normal. MRI brain pending. We discussed that after an initial seizure, unless there are significant risk factors, an abnormal neurological exam, an EEG showing epileptiform abnormalities, and/or abnormal neuroimaging, treatment with an antiepileptic drug is not indicated. In her case, with the possible seizures as a child, I discussed the option of starting medication, however agreed to hold off at this time unless she has another event. Proceed with MRI brain as scheduled. She is aware of [place] driving laws to stop driving after a seizure, until 6 months seizure-free. Follow-up with psychiatry for schizophrenia. She will follow-up in 4 months and knows to call our office for any changes in the interim.   Thank you for allowing me to participate in her care.  Please do not hesitate to call for any questions or concerns.  The duration of this appointment visit was 25 minutes of face-to-face time with the patient.  Greater than 50% of this time was spent in counseling, explanation of diagnosis, planning of further management, and coordination of care.   Ellouise Newer, M.D.   CC: Dr. Eddie Dibbles

## 2015-10-07 NOTE — Procedures (Signed)
ELECTROENCEPHALOGRAM REPORT  Dates of Recording: 09/25/2015 to 09/26/2015  Patient's Name: Natalie Wagner MRN: [id number] Date of Birth: 1996-01-24  Referring Provider: Dr. Ellouise Newer  Procedure: 24-hour ambulatory EEG  History: This is a 19 year old woman with a history of schizophrenia and possible seizures as a child (staring spells, stiffening), who had a witnessed a convulsion last 08/26/15  Medications: Zyrtec, LevinSL, Ortho-cyclan, Sprintec, Previfem, Zyprexa, Albuterol, Glucophage, Drisdol  Technical Summary: This is a 24-hour multichannel digital EEG recording measured by the international 10-20 system with electrodes applied with paste and impedances below 5000 ohms performed as portable with EKG monitoring.  The digital EEG was referentially recorded, reformatted, and digitally filtered in a variety of bipolar and referential montages for optimal display.    DESCRIPTION OF RECORDING: During maximal wakefulness, the background activity consisted of a symmetric 8 Hz posterior dominant rhythm which was reactive to eye opening.  There were no epileptiform discharges or focal slowing seen in wakefulness.  During the recording, the patient progresses through wakefulness, drowsiness, and Stage 2 sleep.  Again, there were no epileptiform discharges seen.  Events: There were no push button events.   There were no electrographic seizures seen.  EKG lead was unremarkable.  IMPRESSION: This 24-hour ambulatory EEG study is normal.    CLINICAL CORRELATION: A normal EEG does not exclude a clinical diagnosis of epilepsy.  Typical events were not captured. If further clinical questions remain, inpatient video EEG monitoring may be helpful.   Ellouise Newer, M.D.

## 2015-10-21 ENCOUNTER — Ambulatory Visit (HOSPITAL_COMMUNITY)
Admission: RE | Admit: 2015-10-21 | Discharge: 2015-10-21 | Disposition: A | Discharge status: Home / Self Care | Payer: Medicaid Other | Source: Ambulatory Visit | Attending: Neurology | Admitting: Neurology

## 2015-10-21 DIAGNOSIS — R569 Unspecified convulsions: Secondary | ICD-10-CM | POA: Insufficient documentation

## 2015-10-21 DIAGNOSIS — F209 Schizophrenia, unspecified: Secondary | ICD-10-CM | POA: Insufficient documentation

## 2015-10-21 MED ORDER — GADOBENATE DIMEGLUMINE 529 MG/ML IV SOLN
15.0000 mL | Freq: Once | INTRAVENOUS | Status: AC | PRN
  Administered 2015-10-21: 13 mL via INTRAVENOUS

## 2015-10-23 ENCOUNTER — Telehealth: Payer: Self-pay | Admitting: Family Medicine

## 2015-10-23 NOTE — Telephone Encounter (Signed)
Lmovm to rtn my call. 

## 2015-10-23 NOTE — Telephone Encounter (Signed)
Patient's mother/Ernestine returned my call. Notified her of results.

## 2015-10-23 NOTE — Telephone Encounter (Signed)
-----   Message from Cameron Sprang, MD sent at 10/22/2015  1:48 PM EST ----- Please let patient know I reviewed MRI brain and it is normal, no evidence of tumor, stroke, or bleed. thanks

## 2015-10-24 ENCOUNTER — Encounter (HOSPITAL_COMMUNITY): Payer: Self-pay | Admitting: Psychiatry

## 2015-10-24 ENCOUNTER — Ambulatory Visit (INDEPENDENT_AMBULATORY_CARE_PROVIDER_SITE_OTHER): Payer: Medicaid Other | Admitting: Psychiatry

## 2015-10-24 VITALS — BP 117/74 | HR 85 | Ht 58.5 in | Wt 129.6 lb

## 2015-10-24 DIAGNOSIS — F845 Asperger's syndrome: Secondary | ICD-10-CM | POA: Diagnosis not present

## 2015-10-24 DIAGNOSIS — F203 Undifferentiated schizophrenia: Secondary | ICD-10-CM

## 2015-10-24 DIAGNOSIS — F201 Disorganized schizophrenia: Secondary | ICD-10-CM

## 2015-10-24 MED ORDER — OLANZAPINE 15 MG PO TBDP: 15.0000 mg | ORAL_TABLET | Freq: Every day | ORAL | Status: DC

## 2015-10-24 NOTE — Progress Notes (Signed)
[facility]  Progress Note  Natalie Wagner [id number] 19 y.o.  10/24/2015 9:42 AM  Chief Complaint: Mom states that patient has been refusing her medications i.e. Zyprexa and had one episode where the mother had called the police for the patient to take the peer's. Patient is afraid to take the pills because of weight gain.   History of Present Illness: Patient seen along with her mother patient gave permission for the mother to be in the room. Mom states that in October patient had a seizure is currently on no seizure medications. Because of the weight gain patient has been refusing medications. Mom reports patient tends to get agitated and irritable easily and has been talking to herself and giggling and appears to be responding to internal stimuli.  Discussed with the patient and the mother that patient would benefit from an IM injection off Abilify but patient is refusing to get a shot. She also is refusing to take medications on a regular basis because of the weight gain. Processed this at length and patient is willing to take the medication. By mouth and refusing shot.  Her insight and judgment are poor, states that sleep and appetite are good mood is irritable. Denies suicidal or homicidal ideation denying hallucinations or delusions.  His custody with the mother that she needs to apply for guardianship for the patient. A and also discussed with the patient and the mother that since patient is willing to comply with her medications will see her in one month and if she has been missing her medications will hospitalize her and get her started on an injection. The both stated understanding.      Suicidal Ideation: No Plan Formed: No Patient has means to carry out plan: No  Homicidal Ideation: No Plan Formed: No Patient has means to carry out plan: No  Review of Systems  Constitutional: Negative.  Negative for fever, weight loss and malaise/fatigue.  HENT: Negative.   Negative for congestion, ear discharge and sore throat.   Eyes: Negative for blurred vision, photophobia, discharge and redness.       Wears glasses  Respiratory: Negative.  Negative for cough and wheezing.   Cardiovascular: Negative.  Negative for chest pain, palpitations and orthopnea.  Gastrointestinal: Negative for heartburn, nausea, vomiting, abdominal pain, diarrhea and constipation.       Lactose intolerance  Genitourinary: Negative.  Negative for dysuria and urgency.  Musculoskeletal: Negative.  Negative for myalgias, back pain, joint pain, falls and neck pain.  Skin: Negative.  Negative for rash.  Neurological: Negative for dizziness, tingling, seizures, loss of consciousness, weakness and headaches.  Endo/Heme/Allergies: Negative for environmental allergies.  Psychiatric/Behavioral: Positive for hallucinations. Negative for depression, suicidal ideas, memory loss and substance abuse. The patient is nervous/anxious. The patient does not have insomnia.     Past Medical Family, Social History: Patient is staying at home Family History  Problem Relation Age of Onset  . Asthma Mother   . Diabetes Maternal Grandfather   . Hypertension Maternal Grandfather   . Heart disease Maternal Grandfather   . Kidney disease Maternal Grandfather   . Anesthesia problems Maternal Grandfather     hx. of being hard to wake up post-op  . Hypertension Maternal Uncle   . Hypertension Maternal Grandmother   . Stroke Maternal Grandmother    Past Medical History  Diagnosis Date  . Migraines   . Asthma     prn inhaler  . Schizophrenia ([place])   . History of seizures  as a child     mother states were triggered by migraines; no seizures in > 7 yr.  . Difficulty swallowing pills   . 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 Constipation 05/05/2013    Outpatient Encounter Prescriptions as of 10/24/2015  Medication Sig  . albuterol (PROAIR HFA) 108 (90 BASE) MCG/ACT  inhaler Inhale 2 puffs into the lungs every 4 (four) hours as needed for wheezing or shortness of breath.  . BENZACLIN gel Apply topically every morning.  . cetirizine (ZYRTEC) 10 MG tablet Take 1 tablet (10 mg total) by mouth daily. For allergy symptoms (Patient taking differently: Take 10 mg by mouth as needed. For allergy symptoms)  . hyoscyamine (LEVSIN SL) 0.125 MG SL tablet Take 0.125 mg by mouth as needed.   . metFORMIN (GLUCOPHAGE) 500 MG tablet Take 1 tablet (500 mg total) by mouth daily with breakfast.  . norgestimate-ethinyl estradiol (ORTHO-CYCLEN,SPRINTEC,PREVIFEM) 0.25-35 MG-MCG tablet Take 1 tablet by mouth daily.  Marland Kitchen olanzapine zydis (ZYPREXA) 15 MG disintegrating tablet Take 1 tablet (15 mg total) by mouth at bedtime.  . Vitamin D, Ergocalciferol, (DRISDOL) 50000 UNITS CAPS capsule Take 1 capsule (50,000 Units total) by mouth every 7 (seven) days.  . [DISCONTINUED] olanzapine zydis (ZYPREXA) 15 MG disintegrating tablet TAKE 1 TABLET BY MOUTH EVERY DAY AT BEDTIME   No facility-administered encounter medications on file as of 10/24/2015.    Past Psychiatric History/Hospitalization(s): Anxiety: No Bipolar Disorder: No Depression: No Mania: No Psychosis: Yes Schizophrenia: Yes Personality Disorder: No Hospitalization for psychiatric illness: Yes History of Electroconvulsive Shock Therapy: No Prior Suicide Attempts: Yes  Physical Exam:AIMS score is 0 Constitutional: Blood pressure 117/74, pulse 85, height 4' 10.5" (1.486 m), weight 129 lb 9.6 oz (58.786 kg). General Appearance: alert, oriented, no acute distress and obese  Musculoskeletal: Strength & Muscle Tone: within normal limits Gait & Station: normal Patient leans: N/A Blood pressure 117/74, pulse 85, height 4' 10.5" (1.486 m), weight 129 lb 9.6 oz (58.786 kg). Psychiatric: Speech (describe rate, volume, coherence, spontaneity, and abnormalities if any): normal in volume, rate, tone ,spontaneous  Thought Process  (describe rate, content, abstract reasoning, and computation): Mild dis organized, concrete  Thoughts content patient appears to be ruminating quite a bit and obsessing about her weight gain.  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    SCHIZOPHRENIA- UNDIFFERENTIATED TYPE,   Oppositional defiant disorder  obesity,  borderline diabetes , polycystic ovarian disease     Plan Schizophrenia : Continue Zyprexa 15 mg one pill at bedtime for psychosis Diarrhea Continue a lactose-free diet as it seems to be helping patient with her diarrhea. Obesity Continue walking daily and making healthy choices in regards to food as patient has lost weight since her last visit. Labs None at this visit. Low hemoglobin Patient was asked to start taking an iron pill every day. Continue to follow-up with her primary care physician Call when necessary Follow-up in 84months with Dr. Salem Senate or calls sooner if necessary. Mom has been asked to  apply for guardianship.  This visit was of high intensity and exceeded 25 minutes. 50% of the time was spent in educating the patient  and her mother regarding guardianship and injectable antipsychotics.  Erin Sons, MD

## 2015-11-22 ENCOUNTER — Ambulatory Visit (INDEPENDENT_AMBULATORY_CARE_PROVIDER_SITE_OTHER): Payer: Medicaid Other | Admitting: Psychiatry

## 2015-11-22 ENCOUNTER — Encounter (HOSPITAL_COMMUNITY): Payer: Self-pay | Admitting: Psychiatry

## 2015-11-22 VITALS — BP 110/62 | HR 78 | Ht <= 58 in | Wt 130.4 lb

## 2015-11-22 DIAGNOSIS — F201 Disorganized schizophrenia: Secondary | ICD-10-CM | POA: Diagnosis not present

## 2015-11-22 NOTE — Progress Notes (Signed)
[facility]  Progress Note  Natalie Wagner [id number] 20 y.o.  11/22/2015 10:16 AM  Chief Complaint: Mom states that patient has been noncompliant off-and-on takes the medicine when she likes to other time she spits it out.    History of Present Illness:-      Patient seen along with her mother patient gave permission for the mother to be in the room.  Patient states that she is willing to take the shot incident of taking the pills. Mom states that she herself is on the Risperdal injection and wants the patient to have it. Patient states she does not care.  Patient has not had any seizure. This morning she was a little irritable and did not want to engage in conversation. Discussed with the patient that we would give her an injection and patient states that would be good.  Will get her scheduled for an IM injection of Abilify  Her insight and judgment are poor, states that sleep and appetite are good mood is irritable. Denies suicidal or homicidal ideation denying hallucinations although mom states she responds to internal stimuli at home. No delusions.       Suicidal Ideation: No Plan Formed: No Patient has means to carry out plan: No  Homicidal Ideation: No Plan Formed: No Patient has means to carry out plan: No  Review of Systems  Constitutional: Negative.  Negative for fever, weight loss and malaise/fatigue.  HENT: Negative.  Negative for congestion, ear discharge and sore throat.   Eyes: Negative for blurred vision, photophobia, discharge and redness.       Wears glasses  Respiratory: Negative.  Negative for cough and wheezing.   Cardiovascular: Negative.  Negative for chest pain, palpitations and orthopnea.  Gastrointestinal: Negative for heartburn, nausea, vomiting, abdominal pain, diarrhea and constipation.       Lactose intolerance  Genitourinary: Negative.  Negative for dysuria and urgency.  Musculoskeletal: Negative.  Negative for myalgias, back  pain, joint pain, falls and neck pain.  Skin: Negative.  Negative for rash.  Neurological: Negative for dizziness, tingling, seizures, loss of consciousness, weakness and headaches.  Endo/Heme/Allergies: Negative for environmental allergies.  Psychiatric/Behavioral: Positive for hallucinations. Negative for depression, suicidal ideas, memory loss and substance abuse. The patient is nervous/anxious. The patient does not have insomnia.     Past Medical Family, Social History: Patient is staying at home Family History  Problem Relation Age of Onset  . Asthma Mother   . Diabetes Maternal Grandfather   . Hypertension Maternal Grandfather   . Heart disease Maternal Grandfather   . Kidney disease Maternal Grandfather   . Anesthesia problems Maternal Grandfather     hx. of being hard to wake up post-op  . Hypertension Maternal Uncle   . Hypertension Maternal Grandmother   . Stroke Maternal Grandmother    Past Medical History  Diagnosis Date  . Migraines   . Asthma     prn inhaler  . Schizophrenia ([place])   . History of seizures as a child     mother states were triggered by migraines; no seizures in > 7 yr.  . Difficulty swallowing pills   . 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 Constipation 05/05/2013    Outpatient Encounter Prescriptions as of 11/22/2015  Medication Sig  . albuterol (PROAIR HFA) 108 (90 BASE) MCG/ACT inhaler Inhale 2 puffs into the lungs every 4 (four) hours as needed for wheezing or shortness of breath.  Marland Kitchen  BENZACLIN gel Apply topically every morning.  . cetirizine (ZYRTEC) 10 MG tablet Take 1 tablet (10 mg total) by mouth daily. For allergy symptoms (Patient taking differently: Take 10 mg by mouth as needed. For allergy symptoms)  . hyoscyamine (LEVSIN SL) 0.125 MG SL tablet Take 0.125 mg by mouth as needed.   . metFORMIN (GLUCOPHAGE) 500 MG tablet Take 1 tablet (500 mg total) by mouth daily with breakfast.  . norgestimate-ethinyl  estradiol (ORTHO-CYCLEN,SPRINTEC,PREVIFEM) 0.25-35 MG-MCG tablet Take 1 tablet by mouth daily.  Marland Kitchen olanzapine zydis (ZYPREXA) 15 MG disintegrating tablet Take 1 tablet (15 mg total) by mouth at bedtime.  Marland Kitchen olanzapine zydis (ZYPREXA) 15 MG disintegrating tablet Take 1 tablet (15 mg total) by mouth at bedtime.  . Vitamin D, Ergocalciferol, (DRISDOL) 50000 UNITS CAPS capsule Take 1 capsule (50,000 Units total) by mouth every 7 (seven) days.   No facility-administered encounter medications on file as of 11/22/2015.    Past Psychiatric History/Hospitalization(s): Anxiety: No Bipolar Disorder: No Depression: No Mania: No Psychosis: Yes Schizophrenia: Yes Personality Disorder: No Hospitalization for psychiatric illness: Yes History of Electroconvulsive Shock Therapy: No Prior Suicide Attempts: Yes  Physical Exam:AIMS score is 0 Constitutional: Blood pressure 110/62, pulse 78, height 4\' 10"  (1.473 m), weight 130 lb 6.4 oz (59.149 kg). General Appearance: alert, oriented, no acute distress and obese  Musculoskeletal: Strength & Muscle Tone: within normal limits Gait & Station: normal Patient leans: N/A Blood pressure 110/62, pulse 78, height 4\' 10"  (1.473 m), weight 130 lb 6.4 oz (59.149 kg). Psychiatric: Speech (describe rate, volume, coherence, spontaneity, and abnormalities if any): normal in volume, rate, tone ,spontaneous  Thought Process (describe rate, content, abstract reasoning, and computation): Mild dis organized, concrete  Thoughts content patient appears to be ruminating quite a bit and obsessing about her weight gain.  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    SCHIZOPHRENIA- UNDIFFERENTIATED TYPE,   Oppositional defiant disorder  obesity,  borderline diabetes , polycystic ovarian disease     Plan Schizophrenia : Continue Zyprexa 15 mg one pill at bedtime for psychosis. Discussed starting her on Abilify IM injection. a. Obesity Continue walking daily and making healthy choices in regards to food as patient has lost weight since her last visit. Labs None at this visit. Low hemoglobin Patient was asked to start taking an iron pill every day. Continue to follow-up with her primary care physician Call when necessary Follow-up in 2 weeks with Dr. Salem Senate or calls sooner if necessary. Mom has been asked to apply for guardianship.  This visit was of high intensity and exceeded 25 minutes. 50% of the time was spent in educating the patient  and her mother regarding guardianship and injectable antipsychotics.  Erin Sons, MD

## 2015-11-28 ENCOUNTER — Telehealth (HOSPITAL_COMMUNITY): Payer: Self-pay

## 2015-11-28 NOTE — Telephone Encounter (Signed)
Met with Dr. Salem Senate to discuss patient trying Abilify Maintena, 400 mg IM every 30 days.  Patient has not tried po Abilify yet so arranged her to come in to see Dr.Tadepalli on Tuesday 12/03/15 to discuss plan to try patient on Abilify Maintena injections and to begin patient on a 7 day trial of po Abilify 10 mg, one a day to make sure no reactions to medication.  Ms. Rock Nephew agreed to plan and will see patient on 12/03/15.

## 2015-12-03 ENCOUNTER — Encounter (HOSPITAL_COMMUNITY): Payer: Self-pay | Admitting: Psychiatry

## 2015-12-03 ENCOUNTER — Ambulatory Visit (INDEPENDENT_AMBULATORY_CARE_PROVIDER_SITE_OTHER): Payer: Medicaid Other | Admitting: Psychiatry

## 2015-12-03 VITALS — BP 112/64 | HR 67 | Ht 58.5 in | Wt 129.8 lb

## 2015-12-03 DIAGNOSIS — F201 Disorganized schizophrenia: Secondary | ICD-10-CM

## 2015-12-03 MED ORDER — BENZTROPINE MESYLATE 1 MG PO TABS: 1.0000 mg | ORAL_TABLET | Freq: Every day | ORAL | Status: DC

## 2015-12-03 MED ORDER — ARIPIPRAZOLE 10 MG PO TABS: 10.0000 mg | ORAL_TABLET | Freq: Every day | ORAL | Status: DC

## 2015-12-03 NOTE — Progress Notes (Signed)
[facility]  Progress Note  TEKEYLA BARTON [id number] 20 y.o.  12/03/2015 2:35 PM  Chief Complaint: Mom states that patient continues to be noncompliant    History of Present Illness:-      Patient seen along with her mother patient gave permission for the mother to be in the room.  Patient has been noncompliant with the Zyprexa. Mom cannot remember the last time she took Zyprexa. Discussed the rationale risks benefits options of Abilify 10 mg at bedtime and Cogentin 1 mg daily for EPS and the patient and her mother gave informed consent.  If patient is able to tolerate by mouth Abilify will get her scheduled for the injection.     Will get her scheduled for an IM injection of Abilify  Her insight and judgment are poor, states that sleep and appetite are good mood is irritable. Denies suicidal or homicidal ideation denying hallucinations although mom states she responds to internal stimuli at home. No delusions.       Suicidal Ideation: No Plan Formed: No Patient has means to carry out plan: No  Homicidal Ideation: No Plan Formed: No Patient has means to carry out plan: No  Review of Systems  Constitutional: Negative.  Negative for fever, weight loss and malaise/fatigue.  HENT: Negative.  Negative for congestion, ear discharge and sore throat.   Eyes: Negative for blurred vision, photophobia, discharge and redness.       Wears glasses  Respiratory: Negative.  Negative for cough and wheezing.   Cardiovascular: Negative.  Negative for chest pain, palpitations and orthopnea.  Gastrointestinal: Negative for heartburn, nausea, vomiting, abdominal pain, diarrhea and constipation.       Lactose intolerance  Genitourinary: Negative.  Negative for dysuria and urgency.  Musculoskeletal: Negative.  Negative for myalgias, back pain, joint pain, falls and neck pain.  Skin: Negative.  Negative for rash.  Neurological: Negative for dizziness, tingling, seizures, loss of  consciousness, weakness and headaches.  Endo/Heme/Allergies: Negative for environmental allergies.  Psychiatric/Behavioral: Positive for hallucinations. Negative for depression, suicidal ideas, memory loss and substance abuse. The patient is nervous/anxious. The patient does not have insomnia.     Past Medical Family, Social History: Patient is staying at home Family History  Problem Relation Age of Onset  . Asthma Mother   . Diabetes Maternal Grandfather   . Hypertension Maternal Grandfather   . Heart disease Maternal Grandfather   . Kidney disease Maternal Grandfather   . Anesthesia problems Maternal Grandfather     hx. of being hard to wake up post-op  . Hypertension Maternal Uncle   . Hypertension Maternal Grandmother   . Stroke Maternal Grandmother    Past Medical History  Diagnosis Date  . Migraines   . Asthma     prn inhaler  . Schizophrenia ([place])   . History of seizures as a child     mother states were triggered by migraines; no seizures in > 7 yr.  . Difficulty swallowing pills   . 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 Constipation 05/05/2013    Outpatient Encounter Prescriptions as of 12/03/2015  Medication Sig  . albuterol (PROAIR HFA) 108 (90 BASE) MCG/ACT inhaler Inhale 2 puffs into the lungs every 4 (four) hours as needed for wheezing or shortness of breath.  . BENZACLIN gel Apply topically every morning.  . cetirizine (ZYRTEC) 10 MG tablet Take 1 tablet (10 mg total) by mouth daily. For allergy symptoms (  Patient taking differently: Take 10 mg by mouth as needed. For allergy symptoms)  . hyoscyamine (LEVSIN SL) 0.125 MG SL tablet Take 0.125 mg by mouth as needed.   . metFORMIN (GLUCOPHAGE) 500 MG tablet Take 1 tablet (500 mg total) by mouth daily with breakfast.  . norgestimate-ethinyl estradiol (ORTHO-CYCLEN,SPRINTEC,PREVIFEM) 0.25-35 MG-MCG tablet Take 1 tablet by mouth daily.  Marland Kitchen olanzapine zydis (ZYPREXA) 15 MG disintegrating  tablet Take 1 tablet (15 mg total) by mouth at bedtime.  Marland Kitchen olanzapine zydis (ZYPREXA) 15 MG disintegrating tablet Take 1 tablet (15 mg total) by mouth at bedtime.  . Vitamin D, Ergocalciferol, (DRISDOL) 50000 UNITS CAPS capsule Take 1 capsule (50,000 Units total) by mouth every 7 (seven) days.   No facility-administered encounter medications on file as of 12/03/2015.    Past Psychiatric History/Hospitalization(s): Anxiety: No Bipolar Disorder: No Depression: No Mania: No Psychosis: Yes Schizophrenia: Yes Personality Disorder: No Hospitalization for psychiatric illness: Yes History of Electroconvulsive Shock Therapy: No Prior Suicide Attempts: Yes  Physical Exam:AIMS score is 0 Constitutional: Blood pressure 112/64, pulse 67, height 4' 10.5" (1.486 m), weight 129 lb 12.8 oz (58.877 kg). General Appearance: alert, oriented, no acute distress and obese  Musculoskeletal: Strength & Muscle Tone: within normal limits Gait & Station: normal Patient leans: N/A Blood pressure 112/64, pulse 67, height 4' 10.5" (1.486 m), weight 129 lb 12.8 oz (58.877 kg). Psychiatric: Speech (describe rate, volume, coherence, spontaneity, and abnormalities if any): normal in volume, rate, tone ,spontaneous  Thought Process (describe rate, content, abstract reasoning, and computation): Mild dis organized, concrete  Thoughts content patient appears to be ruminating quite a bit and obsessing about her weight gain.  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    SCHIZOPHRENIA- UNDIFFERENTIATED TYPE,   Oppositional defiant disorder  obesity,  borderline diabetes , polycystic ovarian disease     Plan Schizophrenia : DC Zyprexa as patient is noncompliant. Discussed rationale risks benefits options off Abilify 10 mg by mouth every afternoon and patient and mom gave informed consent along with Cogentin 1 mg daily for EPS. If patient is able to tolerate this she return to clinic in 1 week to get an Abilify injection.  Obesity Continue walking daily and making healthy choices in regards to food as patient has lost weight since her last visit. Labs None at this visit. Low hemoglobin Patient was asked to start taking an iron pill every day. Continue to follow-up with her primary care physician Call when necessary Follow-up in [facility] for an injection   and Dr Salem Senate in 1 monthor calls sooner if necessary. Mom has been asked to apply for guardianship.  This visit was of high intensity and exceeded 25 minutes. 50% of the time was spent in educating the patient  and her mother regarding guardianship and injectable antipsychotics.  Erin Sons, MD

## 2015-12-09 ENCOUNTER — Ambulatory Visit (HOSPITAL_COMMUNITY): Payer: Self-pay | Admitting: Psychiatry

## 2015-12-10 ENCOUNTER — Ambulatory Visit (INDEPENDENT_AMBULATORY_CARE_PROVIDER_SITE_OTHER): Payer: Medicaid Other

## 2015-12-10 ENCOUNTER — Encounter (HOSPITAL_COMMUNITY): Payer: Self-pay

## 2015-12-10 ENCOUNTER — Telehealth (HOSPITAL_COMMUNITY): Payer: Self-pay

## 2015-12-10 VITALS — BP 106/72 | HR 94 | Ht 59.0 in | Wt 127.0 lb

## 2015-12-10 DIAGNOSIS — F201 Disorganized schizophrenia: Secondary | ICD-10-CM | POA: Diagnosis not present

## 2015-12-10 MED ORDER — ARIPIPRAZOLE ER 400 MG IM SUSR
400.0000 mg | INTRAMUSCULAR | Status: DC
  Administered 2015-12-10: 400 mg via INTRAMUSCULAR

## 2015-12-10 MED ORDER — ARIPIPRAZOLE ER 400 MG IM SUSR: 400.0000 mg | INTRAMUSCULAR | Status: DC

## 2015-12-10 NOTE — Telephone Encounter (Signed)
Met with patient with Dr. Salem Senate, her mother and Dennie Maizes, CMA for patient obtain her first due Abilify Maintena 400 mg IM injection.  Patient reported some shortness of breath and chest discomfort over the past week with positive visual hallucinations but no auditory hallucinations.  Dr. Salem Senate evaluated and ordered first injection to be given.  Observed Regan Atkins prepare and administer first injection in patient's right upper outer gluteal area.  Patient tolerated injection without complaint of pain or discomfort and then reviewed possible side effects with patient and her Mother.  Both to call if any reaction to new injection, they will contact 911 or go to local ED if any allergic reaction symptoms and discussed what these may be seen as requiring emergency care.  Both reported understanding and will call back if any problems follow injection.  Telephone call with Antony Haste, pharmacist at [facility] to call in patient's new one time order for Abilify Maintena 400 mg IM every 30 day injection, #1 with no refills per order of Dr. Salem Senate.  Order was printed by mistake so called in new order for patient to pick up for the coming month.  Patient and Mother agreed to call back if any problems getting new order filled and instructed on how to keep medication once filled as it does not need to be kept in a refrigerator.  Patient to call if any concerns and will return in 30 days.

## 2015-12-10 NOTE — Progress Notes (Signed)
Patient ID: Natalie Wagner, female   DOB: 17-Oct-1996, 20 y.o.   MRN: [id number] Patient presented with mother for first Abilify Maintena 400mg  IM injection with flat affect, level mood, denies any auditory hallucinations, but admits to visual hallucinations over the last week. Reports missed Abilify pill 3 times over the past week, but no major side effects stated. Did report SOB and chest discomfort but not sure if associated with the times she took the pill. Dr. Salem Senate came in and evaluated to continue order for first injection. Abilify Maintena 400mg  IM injection prepared as ordered. Given to patient in right upper outer gluteal quadrant. Patient tolerated injection well without complaint of pain or discomfort. Nurse reviewed side effects with mother and patient and reminded of when to call 911 or visit ED if potential allergic reaction to injection. Mother stated understanding plan, patient will continue  taking Cogentin medication as ordered. Will call if any problems adn return for next injection in 30 days.

## 2015-12-25 ENCOUNTER — Encounter: Payer: Self-pay | Admitting: Pediatrics

## 2015-12-25 NOTE — Progress Notes (Signed)
Pre-Visit Planning  Natalie Wagner  is a 20 y.o. female referred by Dominic Pea, MD.   Last seen in [facility] on 09/23/2015 for PCOS.  Followed by Dr. Salem Senate for Schizophrenia.  Recently started in Injectable abilify due to noncompliance history.  Followed by Dr. Delice Lesch for possible seizure disorder.  She will f/u again next month.  No anticonvulsants started at this time.  Previously experienced galactorrhea which appears to have resolved, last prolactin level was normal.  Previous Psych Screenings? NA  Treatment plan at last visit included continue metformin, topical treatment of acne.   Clinical Staff Visit Tasks:   - Urine GC/CT due? no - Psych Screenings Due? NA  Provider Visit Tasks: - Assess PCOS symptoms - Ensure no further galactorrhea - Assess medication compliance, benefits and side effects  - [place] Involvement? Yes - Pertinent Labs? No  PCOS Labs & Referrals:  - Hgba1c annually if normal, every 3 months if abnormal: Due 12/2015 - CMP annually if normal, as needed if abnormal: Due 12/2015 - CBC if on metformin, annually if normal, as needed if abnormal: Due 08/2016 - Lipid every 2 years if normal, annually if abnormal: Due 06/2017 - Vitamin D annually if normal, as needed if abnormal: Due 06/2016 - Nutrition referral: to be discussed - BH Screening: followed by psychiatry

## 2015-12-26 ENCOUNTER — Ambulatory Visit (INDEPENDENT_AMBULATORY_CARE_PROVIDER_SITE_OTHER): Payer: Medicaid Other | Admitting: Clinical

## 2015-12-26 ENCOUNTER — Encounter: Payer: Self-pay | Admitting: *Deleted

## 2015-12-26 ENCOUNTER — Encounter: Payer: Self-pay | Admitting: Pediatrics

## 2015-12-26 ENCOUNTER — Ambulatory Visit (INDEPENDENT_AMBULATORY_CARE_PROVIDER_SITE_OTHER): Payer: Medicaid Other | Admitting: Pediatrics

## 2015-12-26 VITALS — BP 108/61 | HR 64 | Ht 58.27 in | Wt 120.6 lb

## 2015-12-26 DIAGNOSIS — Z1330 Encounter for screening examination for mental health and behavioral disorders, unspecified: Secondary | ICD-10-CM

## 2015-12-26 DIAGNOSIS — Z1389 Encounter for screening for other disorder: Secondary | ICD-10-CM | POA: Diagnosis not present

## 2015-12-26 DIAGNOSIS — Z1342 Encounter for screening for global developmental delays (milestones): Secondary | ICD-10-CM

## 2015-12-26 DIAGNOSIS — R69 Illness, unspecified: Secondary | ICD-10-CM

## 2015-12-26 DIAGNOSIS — E282 Polycystic ovarian syndrome: Secondary | ICD-10-CM

## 2015-12-26 DIAGNOSIS — K5900 Constipation, unspecified: Secondary | ICD-10-CM | POA: Diagnosis not present

## 2015-12-26 LAB — HEMOGLOBIN A1C
HEMOGLOBIN A1C: 5.3 % (ref <5.7)
MEAN PLASMA GLUCOSE: 105 mg/dL (ref <117)

## 2015-12-26 MED ORDER — NORELGESTROMIN-ETH ESTRADIOL 150-35 MCG/24HR TD PTWK: 1.0000 | MEDICATED_PATCH | TRANSDERMAL | Status: DC

## 2015-12-26 MED ORDER — POLYETHYLENE GLYCOL 3350 17 GM/SCOOP PO POWD: ORAL | Status: DC

## 2015-12-26 NOTE — Progress Notes (Signed)
THIS RECORD MAY CONTAIN CONFIDENTIAL INFORMATION THAT SHOULD NOT BE RELEASED WITHOUT REVIEW OF THE SERVICE PROVIDER.  Adolescent Medicine Consultation Follow-Up Visit Natalie Wagner  is a 20 y.o. female referred by Dominic Pea, MD here today for follow-up.    Previsit planning completed:  yes  Pre-Visit Planning  Natalie Wagner  is a 20 y.o. female referred by Dominic Pea, MD.   Last seen in [facility] on 09/23/2015 for PCOS.  Followed by Dr. Salem Senate for Schizophrenia.  Recently started in Injectable abilify due to noncompliance history.  Followed by Dr. Delice Lesch for possible seizure disorder.  She will f/u again next month.  No anticonvulsants started at this time.  Previously experienced galactorrhea which appears to have resolved, last prolactin level was normal.  Previous Psych Screenings? NA  Treatment plan at last visit included continue metformin, topical treatment of acne.   Clinical Staff Visit Tasks:   - Urine GC/CT due? no - Psych Screenings Due? NA  Provider Visit Tasks: - Assess PCOS symptoms - Ensure no further galactorrhea - Assess medication compliance, benefits and side effects  - [place] Involvement? Yes - Pertinent Labs? No  PCOS Labs & Referrals:  - Hgba1c annually if normal, every 3 months if abnormal: Due 12/2015 - CMP annually if normal, as needed if abnormal: Due 12/2015 - CBC if on metformin, annually if normal, as needed if abnormal: Due 08/2016 - Lipid every 2 years if normal, annually if abnormal: Due 06/2017 - Vitamin D annually if normal, as needed if abnormal: Due 06/2016 - Nutrition referral: to be discussed - BH Screening: followed by psychiatry   Growth Chart Viewed? yes   History was provided by the patient and mother.  PCP Confirmed?  yes  My Chart Activated?   yes   HPI:   She believes PCOS is resolved for the most part. However periods are variable and cramps are very heavy. She has been taking the  birth control inconsistently and patient is not pleased with  how they are making her feel. She is considering other options for PCOS. Skin is improved other than some scars. She has not been on zyprexa as of recently. She is considering changing to another form of birth control - perhaps the patch or nexplanon.   She has had no further galactorrhea as of recently.   She has been anxious recently as reflected on PHQ. She also is constipated as well despite taking laxatives  Denies SI and HI. Some Trouble falling asleep. She is ambivalent about therapy for now - would like to be patient with recent addition of abilify.   She is also concerned about "sagging" of her clitoris. This is not causing her any acute issues but wanted to defer further examination until next clinic visit.   Patient's last menstrual period was 12/11/2015. Allergies  Allergen Reactions  . Lactose Intolerance (Gi) Diarrhea   Outpatient Encounter Prescriptions as of 12/26/2015  Medication Sig Note  . albuterol (PROAIR HFA) 108 (90 BASE) MCG/ACT inhaler Inhale 2 puffs into the lungs every 4 (four) hours as needed for wheezing or shortness of breath.   . ARIPiprazole 400 MG SUSR Inject 400 mg into the muscle every 30 (thirty) days.   Marland Kitchen BENZACLIN gel Apply topically every morning.   . benztropine (COGENTIN) 1 MG tablet Take 1 tablet (1 mg total) by mouth daily.   . cetirizine (ZYRTEC) 10 MG tablet Take 1 tablet (10 mg total) by mouth daily. For allergy symptoms (Patient  taking differently: Take 10 mg by mouth as needed. For allergy symptoms)   . [DISCONTINUED] metFORMIN (GLUCOPHAGE) 500 MG tablet Take 1 tablet (500 mg total) by mouth daily with breakfast.   . [DISCONTINUED] norgestimate-ethinyl estradiol (ORTHO-CYCLEN,SPRINTEC,PREVIFEM) 0.25-35 MG-MCG tablet Take 1 tablet by mouth daily.   . hyoscyamine (LEVSIN SL) 0.125 MG SL tablet Take 0.125 mg by mouth as needed. Reported on 12/26/2015 05/15/2014: Received from: [facility]  . norelgestromin-ethinyl estradiol (ORTHO EVRA) 150-35 MCG/24HR transdermal patch Place 1 patch onto the skin once a week.   . polyethylene glycol powder (GLYCOLAX/MIRALAX) powder Take 1 capful once daily   . Vitamin D, Ergocalciferol, (DRISDOL) 50000 UNITS CAPS capsule Take 1 capsule (50,000 Units total) by mouth every 7 (seven) days. (Patient not taking: Reported on 12/26/2015)   . [DISCONTINUED] olanzapine zydis (ZYPREXA) 15 MG disintegrating tablet Take 1 tablet (15 mg total) by mouth at bedtime. (Patient not taking: Reported on 12/26/2015)    No facility-administered encounter medications on file as of 12/26/2015.     Patient Active Problem List   Diagnosis Date Noted  . Convulsions ([place]) 09/03/2015  . 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 Acanthosis 05/20/2015  . Elevated prolactin level ([place]) 12/27/2014  . Galactorrhea 12/27/2014  . CN (constipation) 12/27/2014  . Therapeutic drug monitoring 06/03/2014  . Hyperlipidemia LDL goal <130 05/23/2014  . Lactose intolerance documented with breath testing 10/25/2013  . Finger mass, left 08/30/2013  . Irritable bowel syndrome 05/23/2013  . 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 Acne vulgaris 05/05/2013  . Allergic rhinitis 05/05/2013  . PCOS (polycystic ovarian syndrome) 05/05/2013  . BMI (body mass index), pediatric, > 99% for age 85/27/2014  . Snoring 05/05/2013  . 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 Schizophrenia in children 09/14/2011    Social History   Social History Narrative     The following portions of the patient's history were reviewed and updated as appropriate: allergies, current medications, past family history, past medical history, past social history, past surgical history and problem list.  Physical Exam:  Filed Vitals:   12/26/15 1031  BP: 108/61  Pulse: 64  Height: 4' 10.27" (1.48 m)  Weight: 120 lb 9.6 oz (54.704 kg)   BP 108/61 mmHg  Pulse 64  Ht 4'  10.27" (1.48 m)  Wt 120 lb 9.6 oz (54.704 kg)  BMI 24.97 kg/m2  LMP 12/11/2015 Body mass index: body mass index is 24.97 kg/(m^2). Blood pressure percentiles are 0000000 systolic and [id number] diastolic based on [id number] NHANES data. Blood pressure percentile targets: 90: 120/77, 95: 124/80, 99 + 5 mmHg: 136/93.  Physical Exam  Gen: well appearing in NAD. Minimal eye contact.  Resp: CTAB no wheezes CV: RRR with no murmurs rubs gallops Abd: soft non distended good bowel sounds  Neuro: grossly intact with no deficits seen.   PHQ-SADS 12/26/2015  PHQ-15 14  GAD-7 11  PHQ-9 8  Suicidal Ideation No  Comment Extremely Difficult    Assessment/Plan: Dimples is a 20 yo F with PMH of schizophrenia . We will check labs today to decide if she needs to continue to be on metformin. We discussed birth control and will change to patch in hopes of increasing compliance. We discussed setting a reminder each week to reapply new patch.  rtc in 6 weeks and at this time will reexamine labia/clitoris to assess pt's concerns.   Follow-up:  Return in about 6 weeks (around 02/06/2016) for with any available [employer] Provider.   Medical  decision-making:  >35 minutes spent, more than 50% of appointment was spent discussing diagnosis and management of symptoms

## 2015-12-26 NOTE — Patient Instructions (Signed)
Ethinyl Estradiol; Norelgestromin skin patches What is this medicine? ETHINYL ESTRADIOL;NORELGESTROMIN (ETH in il es tra DYE ole; nor el JES troe min) skin patch is used as a contraceptive (birth control method). This medicine combines two types of female hormones, an estrogen and a progestin. This patch is used to prevent ovulation and pregnancy. This medicine may be used for other purposes; ask your health care provider or pharmacist if you have questions. What should I tell my health care provider before I take this medicine? They need to know if you have or ever had any of these conditions: -abnormal vaginal bleeding -blood vessel disease or blood clots -breast, cervical, endometrial, ovarian, liver, or uterine cancer -diabetes -gallbladder disease -heart disease or recent heart attack -high blood pressure -high cholesterol -kidney disease -liver disease -migraine headaches -stroke -systemic lupus erythematosus (SLE) -tobacco smoker -an unusual or allergic reaction to estrogens, progestins, other medicines, foods, dyes, or preservatives -pregnant or trying to get pregnant -breast-feeding How should I use this medicine? This patch is applied to the skin. Follow the directions on the prescription label. Apply to clean, dry, healthy skin on the buttock, abdomen, upper outer arm or upper torso, in a place where it will not be rubbed by tight clothing. Do not use lotions or other cosmetics on the site where the patch will go. Press the patch firmly in place for 10 seconds to ensure good contact with the skin. Change the patch every 7 days on the same day of the week for 3 weeks. You will then have a break from the patch for 1 week, after which you will apply a new patch. Do not use your medicine more often than directed. Contact your pediatrician regarding the use of this medicine in children. Special care may be needed. This medicine has been used in female children who have started having  menstrual periods. A patient package insert for the product will be given with each prescription and refill. Read this sheet carefully each time. The sheet may change frequently. Overdosage: If you think you have taken too much of this medicine contact a poison control center or emergency room at once. NOTE: This medicine is only for you. Do not share this medicine with others. What if I miss a dose? You will need to replace your patch once a week as directed. If your patch is lost or falls off, contact your health care professional for advice. You may need to use another form of birth control if your patch has been off for more than 1 day. What may interact with this medicine? -acetaminophen -antibiotics or medicines for infections, especially rifampin, rifabutin, rifapentine, and griseofulvin, and possibly penicillins or tetracyclines -aprepitant -ascorbic acid (vitamin C) -atorvastatin -barbiturate medicines, such as phenobarbital -bosentan -carbamazepine -caffeine -clofibrate -cyclosporine -dantrolene -doxercalciferol -felbamate -grapefruit juice -hydrocortisone -medicines for anxiety or sleeping problems, such as diazepam or temazepam -medicines for diabetes, including pioglitazone -modafinil -mycophenolate -nefazodone -oxcarbazepine -phenytoin -prednisolone -ritonavir or other medicines for HIV infection or AIDS -rosuvastatin -selegiline -soy isoflavones supplements -St. John's wort -tamoxifen or raloxifene -theophylline -thyroid hormones -topiramate -warfarin This list may not describe all possible interactions. Give your health care provider a list of all the medicines, herbs, non-prescription drugs, or dietary supplements you use. Also tell them if you smoke, drink alcohol, or use illegal drugs. Some items may interact with your medicine. What should I watch for while using this medicine? Visit your doctor or health care professional for regular checks on your  progress. You will  need a regular breast and pelvic exam and Pap smear while on this medicine. Use an additional method of contraception during the first cycle that you use this patch. If you have any reason to think you are pregnant, stop using this medicine right away and contact your doctor or health care professional. If you are using this medicine for hormone related problems, it may take several cycles of use to see improvement in your condition. Smoking increases the risk of getting a blood clot or having a stroke while you are using hormonal birth control, especially if you are more than 20 years old. You are strongly advised not to smoke. This medicine can make your body retain fluid, making your fingers, hands, or ankles swell. Your blood pressure can go up. Contact your doctor or health care professional if you feel you are retaining fluid. This medicine can make you more sensitive to the sun. Keep out of the sun. If you cannot avoid being in the sun, wear protective clothing and use sunscreen. Do not use sun lamps or tanning beds/booths. If you wear contact lenses and notice visual changes, or if the lenses begin to feel uncomfortable, consult your eye care specialist. In some women, tenderness, swelling, or minor bleeding of the gums may occur. Notify your dentist if this happens. Brushing and flossing your teeth regularly may help limit this. See your dentist regularly and inform your dentist of the medicines you are taking. If you are going to have elective surgery or a MRI, you may need to stop using this medicine before the surgery or MRI. Consult your health care professional for advice. This medicine does not protect you against HIV infection (AIDS) or any other sexually transmitted diseases. What side effects may I notice from receiving this medicine? Side effects that you should report to your doctor or health care professional as soon as possible: -breast tissue changes or  discharge -changes in vaginal bleeding during your period or between your periods -chest pain -coughing up blood -dizziness or fainting spells -headaches or migraines -leg, arm or groin pain -severe or sudden headaches -stomach pain (severe) -sudden shortness of breath -sudden loss of coordination, especially on one side of the body -speech problems -symptoms of vaginal infection like itching, irritation or unusual discharge -tenderness in the upper abdomen -vomiting -weakness or numbness in the arms or legs, especially on one side of the body -yellowing of the eyes or skin Side effects that usually do not require medical attention (report to your doctor or health care professional if they continue or are bothersome): -breakthrough bleeding and spotting that continues beyond the 3 initial cycles of pills -breast tenderness -mood changes, anxiety, depression, frustration, anger, or emotional outbursts -increased sensitivity to sun or ultraviolet light -nausea -skin rash, acne, or brown spots on the skin -weight gain (slight) This list may not describe all possible side effects. Call your doctor for medical advice about side effects. You may report side effects to FDA at 1-800-FDA-1088. Where should I keep my medicine? Keep out of the reach of children. Store at room temperature between 15 and 30 degrees C (59 and 86 degrees F). Keep the patch in its pouch until time of use. Throw away any unused medicine after the expiration date. Dispose of used patches properly. Since a used patch may still contain active hormones, fold the patch in half so that it sticks to itself prior to disposal. Throw away in a place where children or pets cannot reach. NOTE: This  sheet is a summary. It may not cover all possible information. If you have questions about this medicine, talk to your doctor, pharmacist, or health care provider.    2016, Elsevier/Gold Standard. (2008-10-11 [id number])

## 2015-12-26 NOTE — Progress Notes (Signed)
Attending Co-Signature.  I saw and evaluated the patient, performing the key elements of the service.  I developed the management plan that is described in the resident's note, and I agree with the content.  Zannie Runkle FA[place], MD Adolescent Medicine Specialist 

## 2015-12-27 LAB — COMPREHENSIVE METABOLIC PANEL
ALK PHOS: 43 U/L — AB (ref 47–176)
ALT: 40 U/L — AB (ref 5–32)
AST: 37 U/L — ABNORMAL HIGH (ref 12–32)
Albumin: 4.2 g/dL (ref 3.6–5.1)
BUN: 14 mg/dL (ref 7–20)
CALCIUM: 9.2 mg/dL (ref 8.9–10.4)
CHLORIDE: 102 mmol/L (ref 98–110)
CO2: 26 mmol/L (ref 20–31)
Creat: 0.88 mg/dL (ref 0.50–1.00)
GLUCOSE: 69 mg/dL (ref 65–99)
POTASSIUM: 4.1 mmol/L (ref 3.8–5.1)
Sodium: 141 mmol/L (ref 135–146)
TOTAL PROTEIN: 6.7 g/dL (ref 6.3–8.2)
Total Bilirubin: 0.7 mg/dL (ref 0.2–1.1)

## 2015-12-31 NOTE — BH Specialist Note (Signed)
Visit Date: 12/26/15 Referring Provider: Lenore Cordia, MD Session Time:  1015AM- 1035AM (20 minutes) Type of Service: [place] Interpreter: No.  Interpreter Name & Language: N/A   PRESENTING CONCERNS:  Natalie Wagner is a 20 y.o. female brought in by mother. Natalie Wagner was referred to [facility] for ongoing concerns with consistently obtaining mental health support.  Natalie Wagner is followed by Dr. Salem Senate, psychiatrist.     GOALS ADDRESSED:  Adequate support system in place for mental health support Increase use of positive coping skills   INTERVENTIONS:  Introduced [facility] role within integrated care team Assessed current concerns/immediate needs Reviewed PHQ-SADS Assessed current support system & counseling options   SCREENS/ASSESSMENT TOOLS COMPLETED: PHQ-SADS 12/26/2015  PHQ-15 14  GAD-7 11  PHQ-9 8  Suicidal Ideation No  Comment Extremely Difficult    ASSESSMENT/OUTCOME:  Natalie Wagner presented to be casually dressed and had a normal affect.   She reported mild to moderate symptoms of depression and anxiety on the PHQ-SADS.  She denied any visual or auditory hallucinations at this time.  Natalie Wagner was ambivalent to going back for psycho therapy.  Mother will contact outpatient behavioral health if Natalie Wagner decides to have psycho therapy in the future.   TREATMENT PLAN:  Continue outpatient visit with Dr. Salem Senate for medication management.  No follow up visit with this [facility] scheduled.  This [facility] will be available for additional support as needed.     [facility]

## 2016-01-03 ENCOUNTER — Encounter (HOSPITAL_COMMUNITY): Payer: Self-pay | Admitting: Psychiatry

## 2016-01-03 ENCOUNTER — Ambulatory Visit (INDEPENDENT_AMBULATORY_CARE_PROVIDER_SITE_OTHER): Payer: Medicaid Other | Admitting: Psychiatry

## 2016-01-03 VITALS — BP 111/71 | HR 74 | Ht 58.5 in | Wt 119.8 lb

## 2016-01-03 DIAGNOSIS — F201 Disorganized schizophrenia: Secondary | ICD-10-CM | POA: Diagnosis not present

## 2016-01-03 DIAGNOSIS — F913 Oppositional defiant disorder: Secondary | ICD-10-CM

## 2016-01-03 DIAGNOSIS — F203 Undifferentiated schizophrenia: Secondary | ICD-10-CM

## 2016-01-03 MED ORDER — ARIPIPRAZOLE ER 400 MG IM SUSR: 400.0000 mg | INTRAMUSCULAR | Status: DC

## 2016-01-03 MED ORDER — BENZTROPINE MESYLATE 1 MG PO TABS: 1.0000 mg | ORAL_TABLET | Freq: Every day | ORAL | Status: DC

## 2016-01-03 NOTE — Progress Notes (Signed)
[facility]  Progress Note  Natalie Wagner [id number] 20 y.o.  01/03/2016 10:03 AM  Chief Complaint: I'm doing well in a lost weight   History of Present Illness:-      Patient seen along with her mother patient gave permission for the mother to be in the room. Patient seen today for medication follow-up. Mom states that patient is doing well, on the shot, patient has lost some weight, her sleep and appetite are good mood has been good she has been exercising and has lost weight feels very good about it. Patient has had a few panic attacks discussed deep breathing and relaxation techniques patient is willing to do that. Patient denies suicidal or homicidal ideation no hallucinations or delusions.   Discussed provider leaving the clinic and transferred to Dr. [name] patient is comfortable with that.      Suicidal Ideation: No Plan Formed: No Patient has means to carry out plan: No  Homicidal Ideation: No Plan Formed: No Patient has means to carry out plan: No  Review of Systems  Constitutional: Negative.  Negative for fever, weight loss and malaise/fatigue.  HENT: Negative.  Negative for congestion, ear discharge and sore throat.   Eyes: Negative for blurred vision, photophobia, discharge and redness.       Wears glasses  Respiratory: Negative.  Negative for cough and wheezing.   Cardiovascular: Negative.  Negative for chest pain, palpitations and orthopnea.  Gastrointestinal: Negative for heartburn, nausea, vomiting, abdominal pain, diarrhea and constipation.       Lactose intolerance  Genitourinary: Negative.  Negative for dysuria and urgency.  Musculoskeletal: Negative.  Negative for myalgias, back pain, joint pain, falls and neck pain.  Skin: Negative.  Negative for rash.  Neurological: Negative for dizziness, tingling, seizures, loss of consciousness, weakness and headaches.  Endo/Heme/Allergies: Negative for environmental allergies.  Psychiatric/Behavioral:  Positive for hallucinations. Negative for depression, suicidal ideas, memory loss and substance abuse. The patient is nervous/anxious. The patient does not have insomnia.     Past Medical Family, Social History: Patient is staying at home Family History  Problem Relation Age of Onset  . Asthma Mother   . Diabetes Maternal Grandfather   . Hypertension Maternal Grandfather   . Heart disease Maternal Grandfather   . Kidney disease Maternal Grandfather   . Anesthesia problems Maternal Grandfather     hx. of being hard to wake up post-op  . Hypertension Maternal Uncle   . Hypertension Maternal Grandmother   . Stroke Maternal Grandmother    Past Medical History  Diagnosis Date  . Migraines   . Asthma     prn inhaler  . Schizophrenia ([place])   . History of seizures as a child     mother states were triggered by migraines; no seizures in > 7 yr.  . Difficulty swallowing pills   . 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 Constipation 05/05/2013  . Elevated prolactin level ([place]) 12/27/2014  . Galactorrhea 12/27/2014  . 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    Outpatient Encounter Prescriptions as of 01/03/2016  Medication Sig  . albuterol (PROAIR HFA) 108 (90 BASE) MCG/ACT inhaler Inhale 2 puffs into the lungs every 4 (four) hours as needed for wheezing or shortness of breath.  . ARIPiprazole 400 MG SUSR Inject 400 mg into the muscle every 30 (thirty) days.  Marland Kitchen BENZACLIN gel Apply topically every morning.  . benztropine (COGENTIN) 1 MG tablet Take 1 tablet (1 mg  total) by mouth daily.  . cetirizine (ZYRTEC) 10 MG tablet Take 1 tablet (10 mg total) by mouth daily. For allergy symptoms (Patient taking differently: Take 10 mg by mouth as needed. For allergy symptoms)  . hyoscyamine (LEVSIN SL) 0.125 MG SL tablet Take 0.125 mg by mouth as needed. Reported on 12/26/2015  . norelgestromin-ethinyl estradiol (ORTHO EVRA) 150-35 MCG/24HR transdermal patch Place 1 patch onto the  skin once a week.  . polyethylene glycol powder (GLYCOLAX/MIRALAX) powder Take 1 capful once daily  . Vitamin D, Ergocalciferol, (DRISDOL) 50000 UNITS CAPS capsule Take 1 capsule (50,000 Units total) by mouth every 7 (seven) days. (Patient not taking: Reported on 12/26/2015)  . [DISCONTINUED] ARIPiprazole 400 MG SUSR Inject 400 mg into the muscle every 30 (thirty) days.  . [DISCONTINUED] benztropine (COGENTIN) 1 MG tablet Take 1 tablet (1 mg total) by mouth daily.   No facility-administered encounter medications on file as of 01/03/2016.    Past Psychiatric History/Hospitalization(s): Anxiety: No Bipolar Disorder: No Depression: No Mania: No Psychosis: Yes Schizophrenia: Yes Personality Disorder: No Hospitalization for psychiatric illness: Yes History of Electroconvulsive Shock Therapy: No Prior Suicide Attempts: Yes  Physical Exam:AIMS score is 0 Constitutional: Blood pressure 111/71, pulse 74, height 4' 10.5" (1.486 m), weight 119 lb 12.8 oz (54.341 kg), last menstrual period 12/11/2015. General Appearance: alert, oriented, no acute distress and obese  Musculoskeletal: Strength & Muscle Tone: within normal limits Gait & Station: normal Patient leans: N/A Blood pressure 111/71, pulse 74, height 4' 10.5" (1.486 m), weight 119 lb 12.8 oz (54.341 kg), last menstrual period 12/11/2015. Psychiatric: Speech (describe rate, volume, coherence, spontaneity, and abnormalities if any): normal in volume, rate, tone ,spontaneous  Thought Process (describe rate, content, abstract reasoning, and computation): Mild dis organized, concrete  Thoughts content patient appears to be ruminating quite a bit and obsessing about her weight gain.  Mental Status: Orientation: oriented to person, place, situation, day of week, month of year and year Mood & Affect: normal affect Attention Span & Concentration: Fair/fair  Cognition: Is intact Recent and remote memories: Seems Intact and age  appropriate Insight and judgment: fair/fair  Language and [employer] of Knowledge: [place] (Choose Three): Established Problem, Stable/Improving (1), Review of Psycho-Social Stressors (1), Review of Last Therapy Session (1) and Review of Medication Regimen & Side Effects (2)  Assessment:  Diagnoses.    SCHIZOPHRENIA- UNDIFFERENTIATED TYPE,   Oppositional defiant disorder  obesity,  borderline diabetes , polycystic ovarian disease     Plan Schizophrenia Patient will continue Abilify 400 mg IM injection. Every month. Continue low  Cogentin 1 mg daily for EPS.   Obesity Continue walking daily and making healthy choices in regards to food as patient has lost weight since her last visit. Labs None at this visit. Low hemoglobin Patient was asked to start taking an iron pill every day. Continue to follow-up with her primary care physician Call when necessary Discussed with the patient that this provider will be leaving the clinic and she'll be transferred to Dr. [name] service patient is comfortable with that. Mom has been asked to apply for guardianship.  Return to clinic in 2 months to see Dr. Arnetha Courser, MD

## 2016-01-06 DIAGNOSIS — Z0271 Encounter for disability determination: Secondary | ICD-10-CM

## 2016-01-07 IMAGING — CT CT HEAD W/O CM
1 series · 16 of 30 positions shown, 20 images · non-contrast
Comparison: Brain MRI on 01/14/2015

CLINICAL DATA: New onset seizure today.  Schizophrenia.

EXAM:
CT HEAD WITHOUT CONTRAST
TECHNIQUE: Contiguous axial images were obtained from the base of the skull
through the vertex without intravenous contrast.

[Series 2: head 5.0 h30s · axial · 0.41mm/px · z∈[-82,+53]mm · 16 of 31 slices shown, 20 images]
[im 2/31  brain]
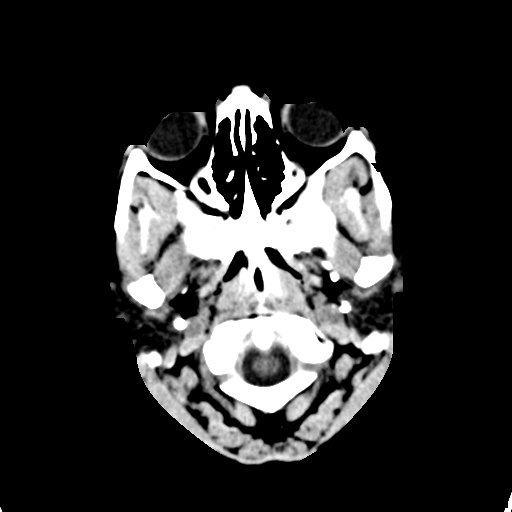
[im 2/31  bone]
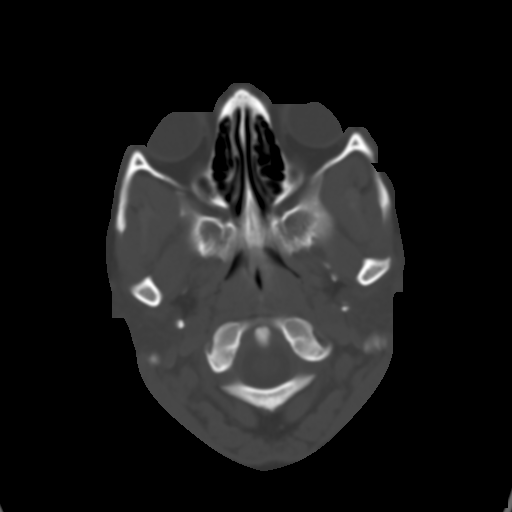
[im 4/31  brain]
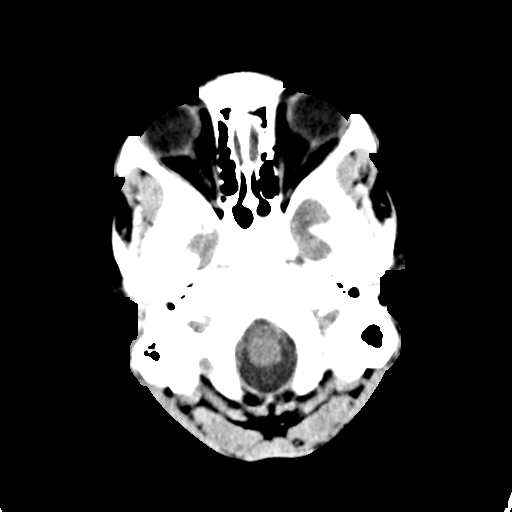
[im 6/31  brain]
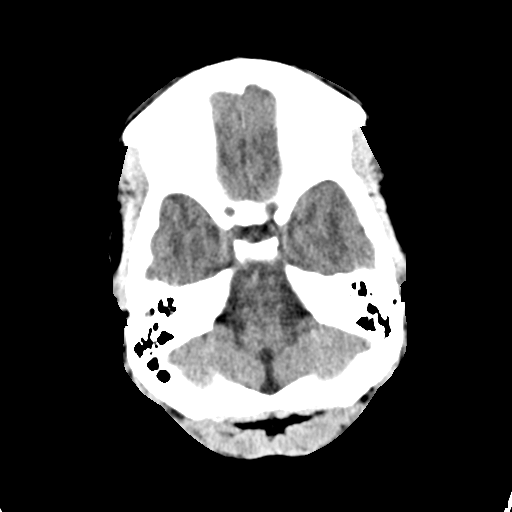
[im 8/31  brain]
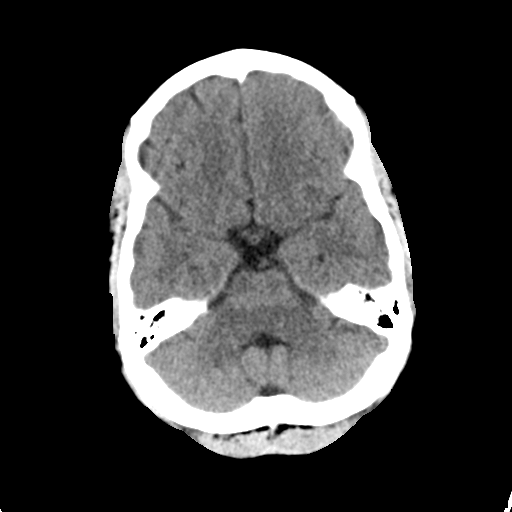
[im 9/31  brain]
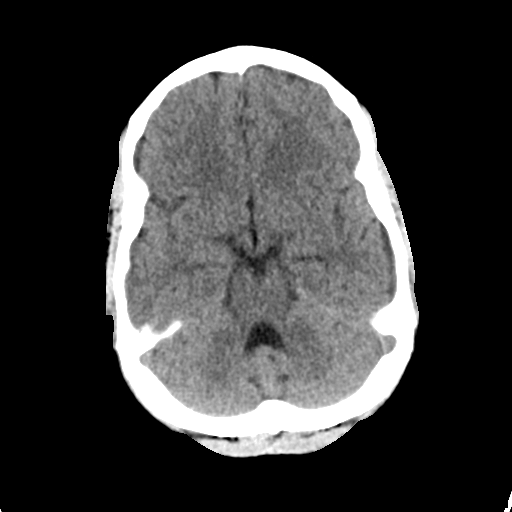
[im 9/31  bone]
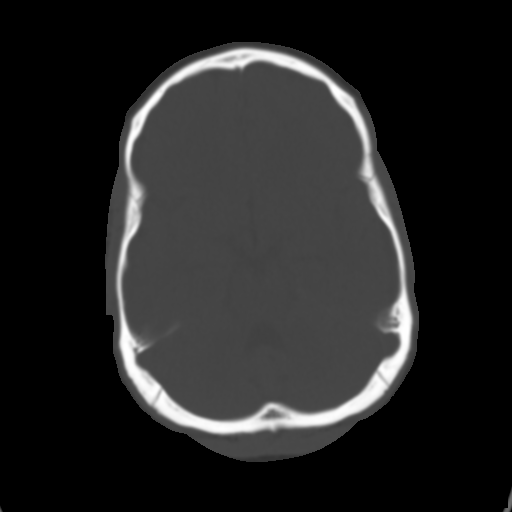
[im 11/31  brain]
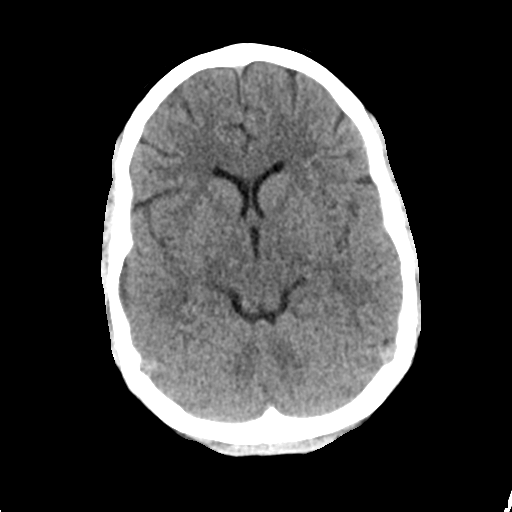
[im 13/31  brain]
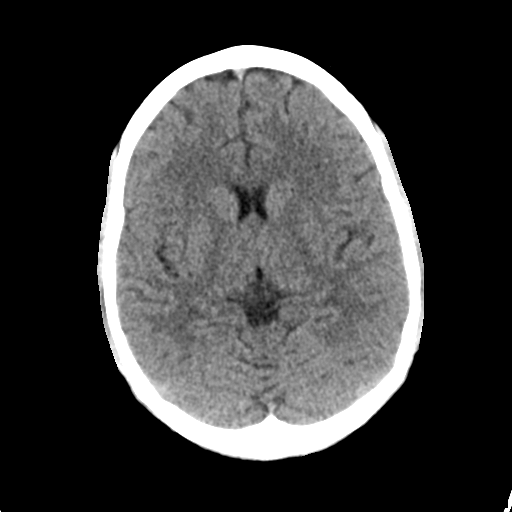
[im 15/31  brain]
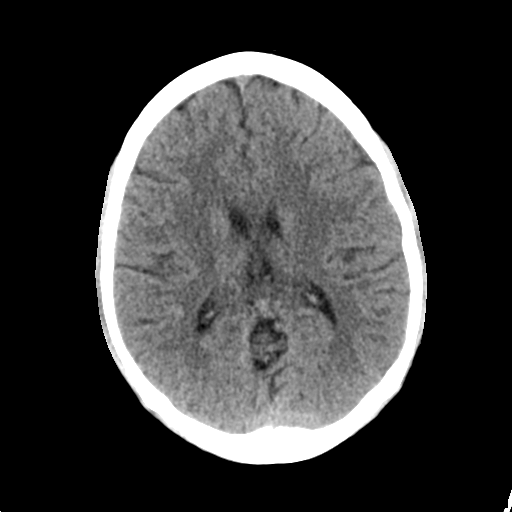
[im 16/31  brain]
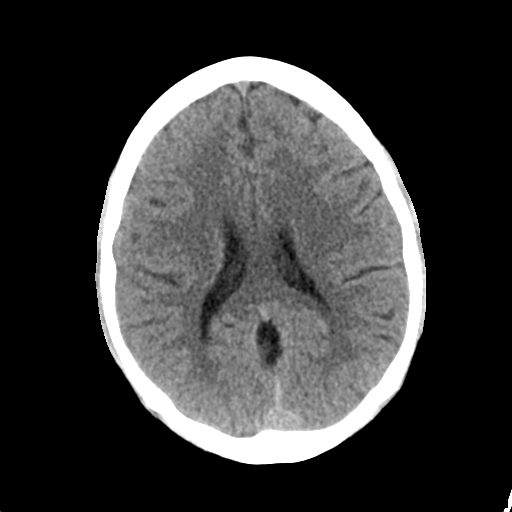
[im 16/31  bone]
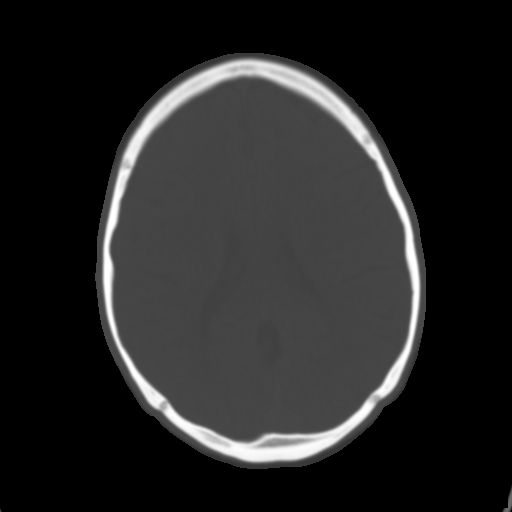
[im 18/31  brain]
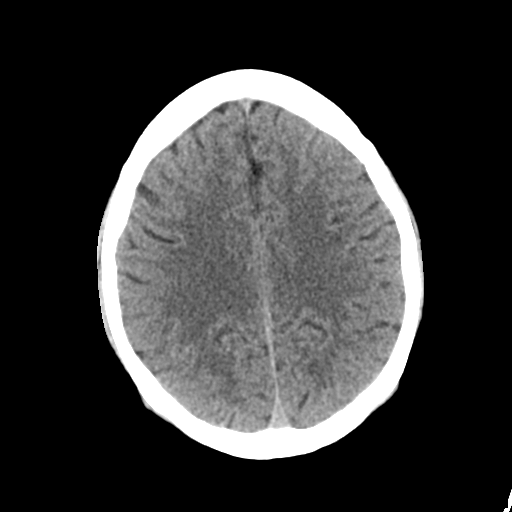
[im 20/31  brain]
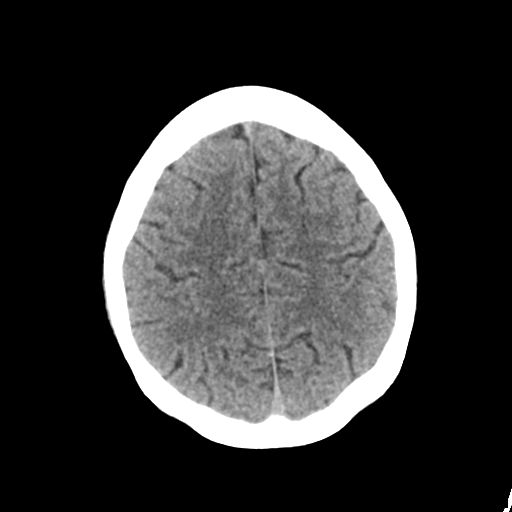
[im 22/31  brain]
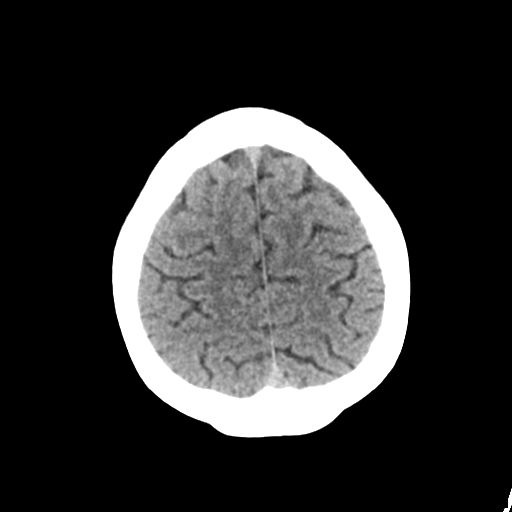
[im 23/31  brain]
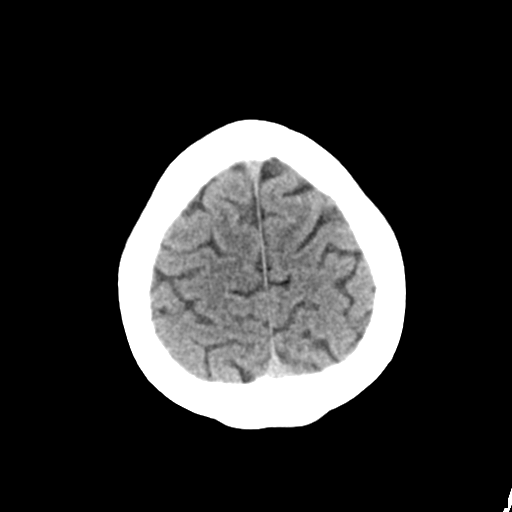
[im 23/31  bone]
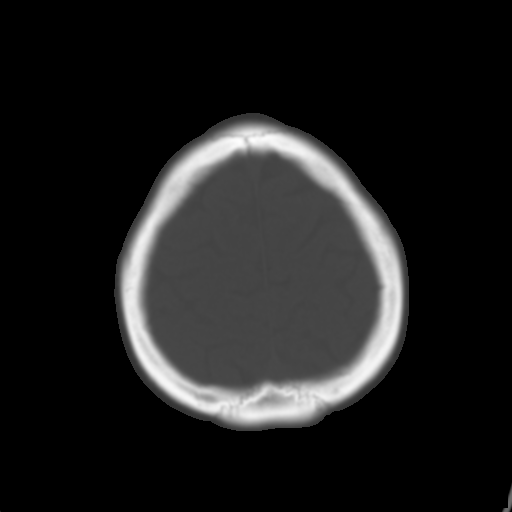
[im 25/31  brain]
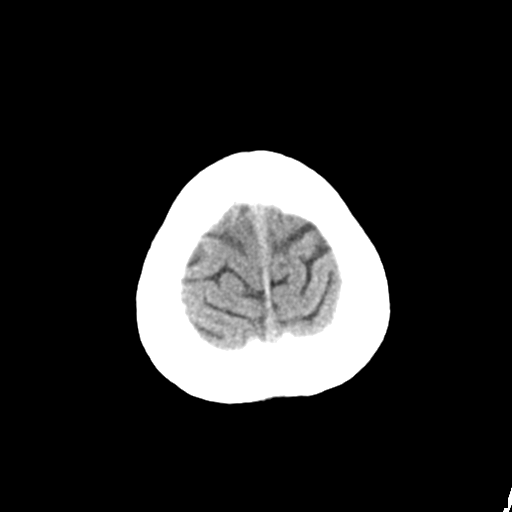
[im 27/31  brain]
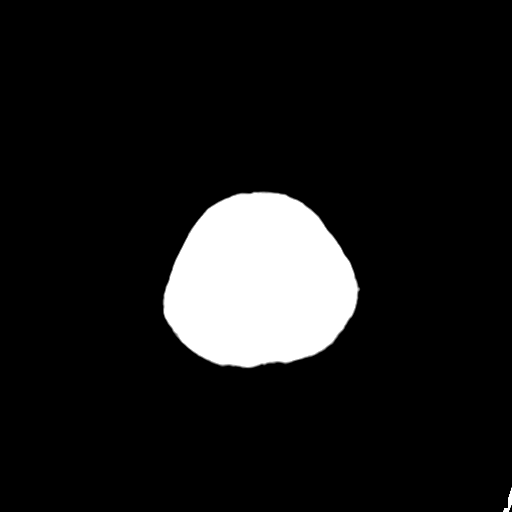
[im 29/31  brain]
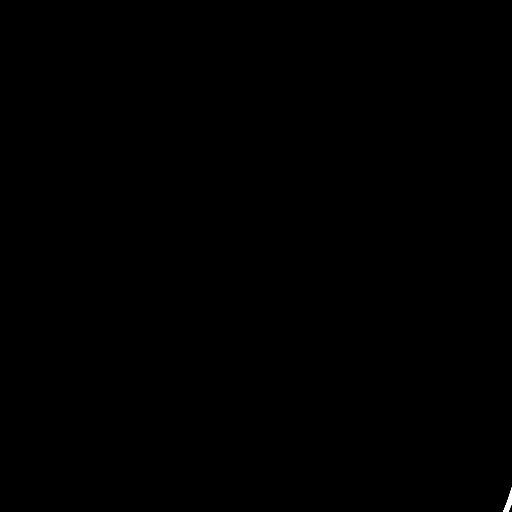

[16 of 30 positions shown; findings below may reference images not displayed]

FINDINGS: No evidence of intracranial hemorrhage, brain edema, or other signs
of acute infarction. No evidence of intracranial mass lesion or mass
effect.

No abnormal extraaxial fluid collections identified. Ventricles are
normal in size. No skull abnormality identified.
IMPRESSION: Negative noncontrast head CT.

## 2016-01-08 ENCOUNTER — Telehealth: Payer: Self-pay | Admitting: *Deleted

## 2016-01-08 NOTE — Telephone Encounter (Signed)
Tc to mom. Notified that labs were overall normal. Her liver function tests are slightly high but not at a level that needs any attention now. We will repeat them at a future visit to ensure they are stable. Advised mom that next appt is scheduled with Jonathon Resides 02/03/16.

## 2016-01-08 NOTE — Telephone Encounter (Signed)
-----   Message from Gaspar Skeeters, MD sent at 01/08/2016 11:54 AM EST ----- Please notify patient that labs were overall normal.  Her liver function tests are slightly high but not at a level that needs any attention now.  We will repeat them at a future visit to ensure they are stable.  Next appt is scheduled with Jonathon Resides who can review more at the appt.

## 2016-01-08 NOTE — Progress Notes (Signed)
Quick Note:  Please notify patient that labs were overall normal. Her liver function tests are slightly high but not at a level that needs any attention now. We will repeat them at a future visit to ensure they are stable. Next appt is scheduled with Jonathon Resides who can review more at the appt. ______

## 2016-01-09 ENCOUNTER — Encounter (HOSPITAL_COMMUNITY): Payer: Self-pay

## 2016-01-09 ENCOUNTER — Ambulatory Visit (INDEPENDENT_AMBULATORY_CARE_PROVIDER_SITE_OTHER): Payer: Medicaid Other

## 2016-01-09 VITALS — BP 108/68 | HR 99 | Ht 58.5 in | Wt 120.4 lb

## 2016-01-09 DIAGNOSIS — F201 Disorganized schizophrenia: Secondary | ICD-10-CM | POA: Diagnosis not present

## 2016-01-09 MED ORDER — ARIPIPRAZOLE ER 400 MG IM SUSR
400.0000 mg | INTRAMUSCULAR | Status: DC
  Administered 2016-01-09: 400 mg via INTRAMUSCULAR

## 2016-01-09 NOTE — Progress Notes (Signed)
Patient ID: Natalie Wagner, female   DOB: 1996-01-14, 21 y.o.   MRN: [id number] Patient came in for her injection of Abilify Maintena 400 mg IM. Patient presents with appropriate affect. Patient denies any visual or audio hallucinations. Patient reports a good appetite and overall good mood. She expressed concerns over being on the shot for life and wanted to know if she could ever get off. I advised that this is a discussion she should have with the doctor. The injection of Abilify Maintena 400 mg IM was prepared as ordered and administered IM in patients right upper outer gluteal area. Patient tolerated the procedure well and without complaint. Patient agreed to come back in 30 days for her next injection. We discussed moving her appointment with Dr. Carmie End up so that she can speak with her about her concerns before her next injection.

## 2016-01-16 ENCOUNTER — Ambulatory Visit (INDEPENDENT_AMBULATORY_CARE_PROVIDER_SITE_OTHER): Payer: Medicaid Other | Admitting: Psychiatry

## 2016-01-16 ENCOUNTER — Encounter (HOSPITAL_COMMUNITY): Payer: Self-pay | Admitting: Psychiatry

## 2016-01-16 VITALS — BP 130/69 | HR 73 | Ht 59.0 in | Wt 120.2 lb

## 2016-01-16 DIAGNOSIS — F203 Undifferentiated schizophrenia: Secondary | ICD-10-CM | POA: Diagnosis not present

## 2016-01-16 DIAGNOSIS — G4700 Insomnia, unspecified: Secondary | ICD-10-CM

## 2016-01-16 DIAGNOSIS — F913 Oppositional defiant disorder: Secondary | ICD-10-CM

## 2016-01-16 NOTE — Progress Notes (Signed)
Patient ID: Natalie Wagner, female   DOB: [birth year], 20 y.o.   MRN: [id number]  [facility]  Progress Note  Natalie Wagner [id number] 20 y.o.  01/16/2016 11:14 AM  Chief Complaint: I am concerned about my meds.    History of Present Illness:  Patient seen along with her mother patient gave permission for the mother to be in the room. Patient seen today for medication follow-up.  Pt states she is having panic attacks about once a week. Pt uses relaxation techniques that helps sometimes.    Pt recently got her second shot of Abilify and reports insomnia. Her mind races and pt is feeling tired. Pt denies depression and is happy mostly. Pt has been irritable and reports it is a change for her.   Denies AVH, ideas of reference and paranoia. Mom states when pt is alone she is talking to herself and laughing. Pt states she is watching tv.   Pt is exercising about twice a month. Pt has lost weight feels very good about it. Pt is worried because her appetite seems increased but mom and pt both report pt has not put on weight and is eating healthy.   Taking meds as prescribed and denies SE.   Suicidal Ideation: No Plan Formed: No Patient has means to carry out plan: No  Homicidal Ideation: No Plan Formed: No Patient has means to carry out plan: No  Review of Systems  Constitutional: Negative.  Negative for fever, weight loss and malaise/fatigue.  HENT: Negative.  Negative for congestion, ear discharge and sore throat.   Eyes: Negative for blurred vision, photophobia, discharge and redness.       Wears glasses  Respiratory: Negative.  Negative for cough and wheezing.   Cardiovascular: Negative.  Negative for chest pain, palpitations and orthopnea.  Gastrointestinal: Negative for heartburn, nausea, vomiting, abdominal pain, diarrhea and constipation.       Lactose intolerance  Genitourinary: Negative.  Negative for dysuria and urgency.  Musculoskeletal: Negative.  Negative  for myalgias, back pain, joint pain, falls and neck pain.  Skin: Negative.  Negative for rash.  Neurological: Negative for dizziness, tingling, seizures, loss of consciousness, weakness and headaches.  Endo/Heme/Allergies: Negative for environmental allergies.  Psychiatric/Behavioral: Negative for depression, suicidal ideas, hallucinations, memory loss and substance abuse. The patient is nervous/anxious and has insomnia.     Past Medical Family, Social History: Patient is staying at home Family History  Problem Relation Age of Onset  . Asthma Mother   . Diabetes Maternal Grandfather   . Hypertension Maternal Grandfather   . Heart disease Maternal Grandfather   . Kidney disease Maternal Grandfather   . Anesthesia problems Maternal Grandfather     hx. of being hard to wake up post-op  . Hypertension Maternal Uncle   . Hypertension Maternal Grandmother   . Stroke Maternal Grandmother    Past Medical History  Diagnosis Date  . Migraines   . Asthma     prn inhaler  . Schizophrenia ([place])   . History of seizures as a child     mother states were triggered by migraines; no seizures in > 7 yr.  . Difficulty swallowing pills   . 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 Constipation 05/05/2013  . Elevated prolactin level ([place]) 12/27/2014  . Galactorrhea 12/27/2014  . 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    Outpatient Encounter Prescriptions as of 01/16/2016  Medication Sig  . albuterol (PROAIR  HFA) 108 (90 BASE) MCG/ACT inhaler Inhale 2 puffs into the lungs every 4 (four) hours as needed for wheezing or shortness of breath.  . ARIPiprazole 400 MG SUSR Inject 400 mg into the muscle every 30 (thirty) days.  Marland Kitchen BENZACLIN gel Apply topically every morning.  . benztropine (COGENTIN) 1 MG tablet Take 1 tablet (1 mg total) by mouth daily.  . Vitamin D, Ergocalciferol, (DRISDOL) 50000 UNITS CAPS capsule Take 1 capsule (50,000 Units total) by mouth every 7 (seven) days.  .  cetirizine (ZYRTEC) 10 MG tablet Take 1 tablet (10 mg total) by mouth daily. For allergy symptoms (Patient not taking: Reported on 01/16/2016)  . hyoscyamine (LEVSIN SL) 0.125 MG SL tablet Take 0.125 mg by mouth as needed. Reported on 01/16/2016  . norelgestromin-ethinyl estradiol (ORTHO EVRA) 150-35 MCG/24HR transdermal patch Place 1 patch onto the skin once a week. (Patient not taking: Reported on 01/16/2016)  . polyethylene glycol powder (GLYCOLAX/MIRALAX) powder Take 1 capful once daily (Patient not taking: Reported on 01/16/2016)   No facility-administered encounter medications on file as of 01/16/2016.    Past Psychiatric History/Hospitalization(s): Anxiety: No Bipolar Disorder: No Depression: No Mania: No Psychosis: Yes Schizophrenia: Yes Personality Disorder: No Hospitalization for psychiatric illness: Yes History of Electroconvulsive Shock Therapy: No Prior Suicide Attempts: Yes  Physical Exam:AIMS score is 0 Constitutional: Blood pressure 130/69, pulse 73, height 4\' 11"  (1.499 m), weight 120 lb 3.2 oz (54.522 kg), last menstrual period 12/11/2015. General Appearance: alert, oriented, no acute distress and obese  Musculoskeletal: Strength & Muscle Tone: within normal limits Gait & Station: normal Patient leans: N/A Blood pressure 130/69, pulse 73, height 4\' 11"  (1.499 m), weight 120 lb 3.2 oz (54.522 kg), last menstrual period 12/11/2015. Psychiatric: Speech (describe rate, volume, coherence, spontaneity, and abnormalities if any): normal in volume, rate, tone ,spontaneous  Thought Process (describe rate, content, abstract reasoning, and computation): Mild dis organized, concrete  Thoughts content patient appears to be ruminating quite a bit and obsessing about her weight gain.  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 Every month.  Continue Cogentin 1 mg daily for EPS.   Insomnia: start trial of Melatonin OTC  Obesity Continue walking daily and making healthy choices in regards to food as patient has maintained weight loss since her last visit.   Labs None at this visit. Low hemoglobin Patient was asked to start taking an iron pill every day. Continue to follow-up with her primary care physician  Pt denies SI and is at an acute low risk for suicide.Patient told to call clinic if any problems occur. Patient advised to go to ER if they should develop SI/HI, side effects, or if symptoms worsen. Has crisis numbers to call if needed. Pt verbalized understanding.  F/up in 2 months or sooner if needed   [name], MD

## 2016-01-17 ENCOUNTER — Encounter (HOSPITAL_COMMUNITY): Payer: Self-pay | Admitting: Oncology

## 2016-01-17 ENCOUNTER — Emergency Department (HOSPITAL_COMMUNITY)
Admission: EM | Admit: 2016-01-17 | Discharge: 2016-01-17 | Disposition: A | Discharge status: Home / Self Care | Payer: Medicaid Other | Attending: Emergency Medicine | Admitting: Emergency Medicine

## 2016-01-17 DIAGNOSIS — K5900 Constipation, unspecified: Secondary | ICD-10-CM | POA: Insufficient documentation

## 2016-01-17 DIAGNOSIS — Z87448 Personal history of other diseases of urinary system: Secondary | ICD-10-CM | POA: Insufficient documentation

## 2016-01-17 DIAGNOSIS — Z8679 Personal history of other diseases of the circulatory system: Secondary | ICD-10-CM | POA: Diagnosis not present

## 2016-01-17 DIAGNOSIS — Z79899 Other long term (current) drug therapy: Secondary | ICD-10-CM | POA: Diagnosis not present

## 2016-01-17 DIAGNOSIS — G4700 Insomnia, unspecified: Secondary | ICD-10-CM

## 2016-01-17 DIAGNOSIS — Z8659 Personal history of other mental and behavioral disorders: Secondary | ICD-10-CM | POA: Diagnosis not present

## 2016-01-17 DIAGNOSIS — J45909 Unspecified asthma, uncomplicated: Secondary | ICD-10-CM | POA: Diagnosis not present

## 2016-01-17 DIAGNOSIS — Z8639 Personal history of other endocrine, nutritional and metabolic disease: Secondary | ICD-10-CM | POA: Insufficient documentation

## 2016-01-17 MED ORDER — HYDROXYZINE HCL 25 MG PO TABS
25.0000 mg | ORAL_TABLET | Freq: Once | ORAL | Status: AC
  Administered 2016-01-17: 25 mg via ORAL
  Filled 2016-01-17: qty 1

## 2016-01-17 NOTE — ED Provider Notes (Signed)
History  By signing my name below, I, Marlowe Kays, attest that this documentation has been prepared under the direction and in the presence of Alyse Low, [place]. Electronically Signed: Marlowe Kays, ED Scribe. 01/17/2016. 11:00 PM.  Chief Complaint  Patient presents with  . Insomnia   The history is provided by the patient and medical records. No language interpreter was used.    HPI Comments:  Natalie Wagner is a 20 y.o. female who presents to the Emergency Department complaining of insomnia for the past two days. Pt states she gets Abilify injections which she believes is causing her insomnia. She has Melatonin in which she has not taken any. She denies modifying factors. She denies SI, HI, fever, chills, nausea or vomiting.   Past Medical History  Diagnosis Date  . Migraines   . Asthma     prn inhaler  . Schizophrenia ([place])   . History of seizures as a child     mother states were triggered by migraines; no seizures in > 7 yr.  . Difficulty swallowing pills   . 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 Constipation 05/05/2013  . Elevated prolactin level ([place]) 12/27/2014  . Galactorrhea 12/27/2014  . 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   Past Surgical History  Procedure Laterality Date  . Umbilical hernia repair    . Mri      under anesthesia  . Colonoscopy    . 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 Asthma Mother   . Diabetes Maternal Grandfather   . Hypertension Maternal Grandfather   . Heart disease Maternal Grandfather   . Kidney disease Maternal Grandfather   . Anesthesia problems Maternal Grandfather     hx. of being hard to wake up post-op  . Hypertension Maternal Uncle   . Hypertension Maternal Grandmother    . Stroke Maternal Grandmother    Social History  Substance Use Topics  . Smoking status: Never Smoker   . Smokeless tobacco: Never Used  . Alcohol Use: No   OB History    No data available     Review of Systems  Constitutional: Negative for fever and chills.  Gastrointestinal: Negative for nausea and vomiting.  Psychiatric/Behavioral: Positive for sleep disturbance. Negative for suicidal ideas, hallucinations and self-injury.  All other systems reviewed and are negative.   Allergies  Lactose intolerance (gi)  Home Medications   Prior to Admission medications   Medication Sig Start Date End Date Taking? Authorizing Provider  albuterol (PROAIR HFA) 108 (90 BASE) MCG/ACT inhaler Inhale 2 puffs into the lungs every 4 (four) hours as needed for wheezing or shortness of breath. 07/08/15   Dominic Pea, MD  ARIPiprazole 400 MG SUSR Inject 400 mg into the muscle every 30 (thirty) days. 01/03/16   Leonides Grills, MD  BENZACLIN gel Apply topically every morning. 0000000   Dierdre Harness, NP  benztropine (COGENTIN) 1 MG tablet Take 1 tablet (1 mg total) by mouth daily. 01/03/16 01/02/17  Leonides Grills, MD  cetirizine (ZYRTEC) 10 MG tablet Take 1 tablet (10 mg total) by mouth daily. For allergy symptoms Patient not taking: Reported on 01/16/2016 07/08/15   Dominic Pea, MD  hyoscyamine (LEVSIN SL) 0.125  MG SL tablet Take 0.125 mg by mouth as needed. Reported on 01/16/2016    Historical Provider, MD  norelgestromin-ethinyl estradiol (ORTHO EVRA) 150-35 MCG/24HR transdermal patch Place 1 patch onto the skin once a week. Patient not taking: Reported on 01/16/2016 12/26/15   Gaspar Skeeters, MD  polyethylene glycol powder [facility]) powder Take 1 capful once daily Patient not taking: Reported on 01/16/2016 12/26/15   Gaspar Skeeters, MD  Vitamin D, Ergocalciferol, (DRISDOL) 50000 UNITS CAPS capsule Take 1 capsule (50,000 Units total) by mouth every 7 (seven) days. 12/19/14   Trude Mcburney, FNP   LMP 12/11/2015 Physical Exam  Constitutional: She is oriented to person, place, and time. She appears well-developed and well-nourished.  HENT:  Head: Normocephalic and atraumatic.  Eyes: EOM are normal.  Neck: Normal range of motion.  Cardiovascular: Normal rate, regular rhythm and normal heart sounds.  Exam reveals no gallop and no friction rub.   No murmur heard. Pulmonary/Chest: Effort normal and breath sounds normal. No respiratory distress. She has no wheezes. She has no rales.  Musculoskeletal: Normal range of motion.  Neurological: She is alert and oriented to person, place, and time.  Skin: Skin is warm and dry.  Psychiatric: She has a normal mood and affect. Her behavior is normal.  Nursing note and vitals reviewed.   ED Course  Procedures (including critical care time) DIAGNOSTIC STUDIES:    COORDINATION OF CARE: 10:59 PM- Will give pt a dose of Vistaril once pt contacts someone to pick her up. Instructed pt to take Melatonin once she gets home. Pt verbalizes understanding and agrees to plan.  Medications - No data to display  Labs Review Labs Reviewed - No data to display  Imaging Review No results found. I have personally reviewed and evaluated these images and lab results as part of my medical decision-making.   EKG Interpretation None      MDM   Final diagnoses:  Insomnia    Pt has not started melatonin.  If pt has a ride I will try giving her a dose of visteril.  Pt is advised to get melatonin and take as directed by her MD.  An After Visit Summary was printed and given to the patient.  [place], PA-C 01/17/16 [place] Liu, MD 01/18/16 [phone]

## 2016-01-17 NOTE — ED Notes (Signed)
Pt states that she is restless and has been having nightmares causing insomnia.  Denies taking any OTC medications to help sleep.  Pt denies SI/HI and AVH.  Pt states she would like the strongest medication to help her sleep.

## 2016-01-17 NOTE — Discharge Instructions (Signed)
Insomnia Insomnia is a sleep disorder that makes it difficult to fall asleep or to stay asleep. Insomnia can cause tiredness (fatigue), low energy, difficulty concentrating, mood swings, and poor performance at work or school.  There are three different ways to classify insomnia:  Difficulty falling asleep.  Difficulty staying asleep.  Waking up too early in the morning. Any type of insomnia can be long-term (chronic) or short-term (acute). Both are common. Short-term insomnia usually lasts for three months or less. Chronic insomnia occurs at least three times a week for longer than three months. CAUSES  Insomnia may be caused by another condition, situation, or substance, such as:  Anxiety.  Certain medicines.  Gastroesophageal reflux disease (GERD) or other gastrointestinal conditions.  Asthma or other breathing conditions.  Restless legs syndrome, sleep apnea, or other sleep disorders.  Chronic pain.  Menopause. This may include hot flashes.  Stroke.  Abuse of alcohol, tobacco, or illegal drugs.  Depression.  Caffeine.   Neurological disorders, such as Alzheimer disease.  An overactive thyroid (hyperthyroidism). The cause of insomnia may not be known. RISK FACTORS Risk factors for insomnia include:  Gender. Women are more commonly affected than men.  Age. Insomnia is more common as you get older.  Stress. This may involve your professional or personal life.  Income. Insomnia is more common in people with lower income.  Lack of exercise.   Irregular work schedule or night shifts.  Traveling between different time zones. SIGNS AND SYMPTOMS If you have insomnia, trouble falling asleep or trouble staying asleep is the main symptom. This may lead to other symptoms, such as:  Feeling fatigued.  Feeling nervous about going to sleep.  Not feeling rested in the morning.  Having trouble concentrating.  Feeling irritable, anxious, or depressed. TREATMENT   Treatment for insomnia depends on the cause. If your insomnia is caused by an underlying condition, treatment will focus on addressing the condition. Treatment may also include:   Medicines to help you sleep.  Counseling or therapy.  Lifestyle adjustments. HOME CARE INSTRUCTIONS   Take medicines only as directed by your health care provider.  Keep regular sleeping and waking hours. Avoid naps.  Keep a sleep diary to help you and your health care provider figure out what could be causing your insomnia. Include:   When you sleep.  When you wake up during the night.  How well you sleep.   How rested you feel the next day.  Any side effects of medicines you are taking.  What you eat and drink.   Make your bedroom a comfortable place where it is easy to fall asleep:  Put up shades or special blackout curtains to block light from outside.  Use a white noise machine to block noise.  Keep the temperature cool.   Exercise regularly as directed by your health care provider. Avoid exercising right before bedtime.  Use relaxation techniques to manage stress. Ask your health care provider to suggest some techniques that may work well for you. These may include:  Breathing exercises.  Routines to release muscle tension.  Visualizing peaceful scenes.  Cut back on alcohol, caffeinated beverages, and cigarettes, especially close to bedtime. These can disrupt your sleep.  Do not overeat or eat spicy foods right before bedtime. This can lead to digestive discomfort that can make it hard for you to sleep.  Limit screen use before bedtime. This includes:  Watching TV.  Using your smartphone, tablet, and computer.  Stick to a routine. This   can help you fall asleep faster. Try to do a quiet activity, brush your teeth, and go to bed at the same time each night.  Get out of bed if you are still awake after 15 minutes of trying to sleep. Keep the lights down, but try reading or  doing a quiet activity. When you feel sleepy, go back to bed.  Make sure that you drive carefully. Avoid driving if you feel very sleepy.  Keep all follow-up appointments as directed by your health care provider. This is important. SEEK MEDICAL CARE IF:   You are tired throughout the day or have trouble in your daily routine due to sleepiness.  You continue to have sleep problems or your sleep problems get worse. SEEK IMMEDIATE MEDICAL CARE IF:   You have serious thoughts about hurting yourself or someone else.   This information is not intended to replace advice given to you by your health care provider. Make sure you discuss any questions you have with your health care provider.   Document Released: 10/23/2000 Document Revised: 07/17/2015 Document Reviewed: 07/27/2014 Elsevier Interactive Patient Education 2016 [employer]  

## 2016-01-17 NOTE — ED Notes (Signed)
Pt states she had started the Ability injections and she has not been able to sleep for two to three days and feels anxious,  She denies homicidal and suicidal ideations

## 2016-01-19 ENCOUNTER — Encounter (HOSPITAL_COMMUNITY): Payer: Self-pay | Admitting: Emergency Medicine

## 2016-01-19 ENCOUNTER — Emergency Department (HOSPITAL_COMMUNITY): Payer: Medicaid Other

## 2016-01-19 ENCOUNTER — Emergency Department (HOSPITAL_COMMUNITY)
Admission: EM | Admit: 2016-01-19 | Discharge: 2016-01-19 | Disposition: A | Discharge status: Home / Self Care | Payer: Medicaid Other | Attending: Physician Assistant | Admitting: Physician Assistant

## 2016-01-19 DIAGNOSIS — Z79899 Other long term (current) drug therapy: Secondary | ICD-10-CM | POA: Insufficient documentation

## 2016-01-19 DIAGNOSIS — R109 Unspecified abdominal pain: Secondary | ICD-10-CM | POA: Insufficient documentation

## 2016-01-19 DIAGNOSIS — Z8742 Personal history of other diseases of the female genital tract: Secondary | ICD-10-CM | POA: Diagnosis not present

## 2016-01-19 DIAGNOSIS — Z3202 Encounter for pregnancy test, result negative: Secondary | ICD-10-CM | POA: Diagnosis not present

## 2016-01-19 DIAGNOSIS — R300 Dysuria: Secondary | ICD-10-CM | POA: Insufficient documentation

## 2016-01-19 DIAGNOSIS — Z8679 Personal history of other diseases of the circulatory system: Secondary | ICD-10-CM | POA: Insufficient documentation

## 2016-01-19 DIAGNOSIS — Z8719 Personal history of other diseases of the digestive system: Secondary | ICD-10-CM | POA: Insufficient documentation

## 2016-01-19 DIAGNOSIS — J45909 Unspecified asthma, uncomplicated: Secondary | ICD-10-CM | POA: Diagnosis not present

## 2016-01-19 DIAGNOSIS — F209 Schizophrenia, unspecified: Secondary | ICD-10-CM | POA: Diagnosis not present

## 2016-01-19 DIAGNOSIS — Z793 Long term (current) use of hormonal contraceptives: Secondary | ICD-10-CM | POA: Diagnosis not present

## 2016-01-19 LAB — URINALYSIS, ROUTINE W REFLEX MICROSCOPIC
Glucose, UA: NEGATIVE mg/dL
Hgb urine dipstick: NEGATIVE
LEUKOCYTES UA: NEGATIVE
NITRITE: NEGATIVE
PH: 6 (ref 5.0–8.0)
PROTEIN: NEGATIVE mg/dL
Specific Gravity, Urine: 1.041 — ABNORMAL HIGH (ref 1.005–1.030)

## 2016-01-19 LAB — WET PREP, GENITAL
Clue Cells Wet Prep HPF POC: NONE SEEN
Sperm: NONE SEEN
Trich, Wet Prep: NONE SEEN
Yeast Wet Prep HPF POC: NONE SEEN

## 2016-01-19 LAB — POC URINE PREG, ED: Preg Test, Ur: NEGATIVE

## 2016-01-19 NOTE — ED Notes (Signed)
Ultrasound notified patient is ready for study.

## 2016-01-19 NOTE — ED Notes (Addendum)
Per pt, states back pain and abdominal pain-was here 2 days ago and was treated

## 2016-01-19 NOTE — ED Provider Notes (Addendum)
CSN: [id number]     Arrival date & time 01/19/16  1035 History   First MD Initiated Contact with Patient 01/19/16 1045     Chief Complaint  Patient presents with  . abdominal pain/back pain      (Consider location/radiation/quality/duration/timing/severity/associated sxs/prior Treatment) HPI  Patient is a 20 year old female with history is concerning, migraines, constipation. She is presenting with abdominal pain and occasionally back pain for last 3 days. Patient states it's intermittent. She also has burning when she urinates. She also has noticed some discharge- she is unsure what color.   Hadmom step out of room and discussed sexual activity patient she's never been sexual active. Patient has one pior vaginal exam.   No nausea no vomiting or diarrhea. No fevers. The pain is mild.   Past Medical History  Diagnosis Date  . Migraines   . Asthma     prn inhaler  . Schizophrenia ([place])   . History of seizures as a child     mother states were triggered by migraines; no seizures in > 7 yr.  . Difficulty swallowing pills   . 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 Constipation 05/05/2013  . Elevated prolactin level ([place]) 12/27/2014  . Galactorrhea 12/27/2014  . 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   Past Surgical History  Procedure Laterality Date  . Umbilical hernia repair    . Mri      under anesthesia  . Colonoscopy    . 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 Asthma Mother   . Diabetes Maternal Grandfather   . Hypertension Maternal Grandfather   . Heart disease Maternal Grandfather   . Kidney disease Maternal Grandfather   . Anesthesia problems Maternal Grandfather     hx. of being hard to wake up post-op  . Hypertension  Maternal Uncle   . Hypertension Maternal Grandmother   . Stroke Maternal Grandmother    Social History  Substance Use Topics  . Smoking status: Never Smoker   . Smokeless tobacco: Never Used  . Alcohol Use: No   OB History    No data available     Review of Systems  Constitutional: Negative for fever and fatigue.  Eyes: Negative for discharge.  Gastrointestinal: Positive for abdominal pain.  Genitourinary: Positive for dysuria.  Skin: Negative for rash.      Allergies  Lactose intolerance (gi)  Home Medications   Prior to Admission medications   Medication Sig Start Date End Date Taking? Authorizing Provider  albuterol (PROAIR HFA) 108 (90 BASE) MCG/ACT inhaler Inhale 2 puffs into the lungs every 4 (four) hours as needed for wheezing or shortness of breath. 07/08/15  Yes Dominic Pea, MD  ARIPiprazole 400 MG SUSR Inject 400 mg into the muscle every 30 (thirty) days. 01/03/16  Yes Leonides Grills, MD  BENZACLIN gel Apply topically every morning. 09/23/15  Yes Dierdre Harness, NP  benztropine (COGENTIN) 1 MG tablet Take 1 tablet (1 mg total) by mouth daily. 01/03/16 01/02/17 Yes Leonides Grills, MD  cetirizine (ZYRTEC) 10 MG tablet Take 1 tablet (10 mg total) by mouth daily. For allergy symptoms Patient taking differently: Take 10 mg by mouth daily as needed for allergies. For allergy symptoms  07/08/15  Yes Dominic Pea, MD  hyoscyamine (LEVSIN SL) 0.125 MG SL tablet Take 0.125 mg by mouth every 4 (four) hours as needed for cramping. Reported on 01/16/2016   Yes Historical Provider, MD  polyethylene glycol powder (GLYCOLAX/MIRALAX) powder Take 1 capful once daily Patient taking differently: Take 1 Container by mouth daily as needed for moderate constipation. Take 1 capful once daily 12/26/15  Yes Gaspar Skeeters, MD  SPRINTEC 28 0.25-35 MG-MCG tablet Take 1 tablet by mouth daily.  12/14/15  Yes Historical Provider, MD  Vitamin D, Ergocalciferol, (DRISDOL) 50000 UNITS CAPS  capsule Take 1 capsule (50,000 Units total) by mouth every 7 (seven) days. 12/19/14  Yes Trude Mcburney, FNP   BP 121/71 mmHg  Pulse 64  Temp(Src) 98.6 F (37 C) (Oral)  Resp 18  SpO2 100%  LMP 01/05/2016 Physical Exam  Constitutional: She is oriented to person, place, and time. She appears well-developed and well-nourished.  HENT:  Head: Normocephalic and atraumatic.  Eyes: Conjunctivae are normal. Right eye exhibits no discharge.  Neck: Neck supple.  Cardiovascular: Normal rate, regular rhythm and normal heart sounds.   No murmur heard. Pulmonary/Chest: Effort normal and breath sounds normal. She has no wheezes. She has no rales.  Abdominal: Soft. She exhibits no distension. There is no tenderness.  No tenderness on exam.  Musculoskeletal: Normal range of motion. She exhibits no edema.  Neurological: She is oriented to person, place, and time. No cranial nerve deficit.  Skin: Skin is warm and dry. No rash noted. She is not diaphoretic.  Psychiatric: She has a normal mood and affect. Her behavior is normal.  Nursing note and vitals reviewed.   ED Course  Procedures (including critical care time) Labs Review Labs Reviewed  WET PREP, GENITAL - Abnormal; Notable for the following:    WBC, Wet Prep HPF POC FEW (*)    All other components within normal limits  URINALYSIS, ROUTINE W REFLEX MICROSCOPIC (NOT AT [facility]) - Abnormal; Notable for the following:    Color, Urine AMBER (*)    APPearance CLOUDY (*)    Specific Gravity, Urine 1.041 (*)    Bilirubin Urine SMALL (*)    Ketones, ur >80 (*)    All other components within normal limits  POC URINE PREG, ED  GC/CHLAMYDIA PROBE AMP ([place]) NOT AT [facility]    Imaging Review US Pelvis Complete  01/19/2016  CLINICAL DATA:  20 year old female complaining of right-sided pelvic pain for the past 2 days. EXAM: TRANSABDOMINAL ULTRASOUND OF PELVIS TECHNIQUE: Transabdominal ultrasound examination of the pelvis was performed including  evaluation of the uterus, ovaries, adnexal regions, and pelvic cul-de-sac. COMPARISON:  Pelvic ultrasound 11/15/2013. FINDINGS: Uterus Measurements: 7.4 x 3.4 x 3.3 cm. No fibroids or other mass visualized. Endometrium Thickness: 3 mm.  No focal abnormality visualized. Right ovary Measurements: 5.2 x 3.2 x 4.3 cm. 4.1 x 2.5 x 3.6 cm anechoic lesion with increased through transmission, compatible with a cyst. Left ovary Measurements: 3.2 x 1.8 x 2.5 cm. Normal appearance/no adnexal mass. Other findings: Trace volume of free fluid in the cul-de-sac, presumably physiologic in this young female patient. IMPRESSION: 1. 4.1 x 2.5 x 3.7 cm simple cyst incidentally noted in the right kidney. 2. Trace volume of free fluid in the cul-de-sac, presumably physiologic in this young female patient. Electronically Signed   By: Vinnie Langton M.D.   On: 01/19/2016 15:50   I have personally reviewed and evaluated these images and lab results as part  of my medical decision-making.   EKG Interpretation None      MDM   Final diagnoses:  Abdominal pain  Patient is a 20 year old feel presenting with mild abdominal pain and occasional bilateral flank pain. She reports it is mild and occasional in nature.  LMP late February. . It has been happening over the last 3 days. Patient notes that she has had mild pain when she urinates. Increased vaginal discharge -she is unsure what color. Patient is not sexually active. Has had one vaginal examine the past.. Her UA shows no evidence for urinary tract infection.  Patient has no tenderness on exam. Normal vital signs. Eating drinking normally.  Vaginal exam showed mild mucus. Patient requesting an ultrasound she is concerned about fibroids.  Patient adamantly denies sexual activity.  No evidence of cervical motion tenderness. No active discharge.  Transvaginal ultrasound does not show any emergent pathology. We'll have her follow-up with her primary care physician.  GC  and Chlamydia are sent, they will call her if is positive. She is low risk since she denies any sexual activity.  We'll  presumtively diagnosed mittelschmerz given its the middle her cycle.  Courteney Julio Alm, MD 01/19/16 1557  Upon trying to discharge patient she admitted that she's been putting water and sugar and her vagina to "clean itout".  We encouraged her not to put anything in her vagina.      Courteney Julio Alm, MD 01/19/16 1609

## 2016-01-19 NOTE — ED Notes (Signed)
Patient c/o constant mid abdominal pain and mid back pain x 2days.  Patient denies nausea/vomiting.  Patient also c/o dysuria x 1wk.  Patient has no other complaints.

## 2016-01-19 NOTE — ED Notes (Signed)
Patient given PO fluids to assist with pelvic ultrasound.

## 2016-01-19 NOTE — Discharge Instructions (Signed)
We are unsure what is causing your pain today. We are happy to report that your vital signs and physical exam appear normal. A transvaginal ultrasound shows no reason for your pain. It is possible that this is related to your cycle given that it is the middle of your cycle. Please keep an eye on your pain and return as needed.    Abdominal Pain, Adult Many things can cause belly (abdominal) pain. Most times, the belly pain is not dangerous. Many cases of belly pain can be watched and treated at home. HOME CARE   Do not take medicines that help you go poop (laxatives) unless told to by your doctor.  Only take medicine as told by your doctor.  Eat or drink as told by your doctor. Your doctor will tell you if you should be on a special diet. GET HELP IF:  You do not know what is causing your belly pain.  You have belly pain while you are sick to your stomach (nauseous) or have runny poop (diarrhea).  You have pain while you pee or poop.  Your belly pain wakes you up at night.  You have belly pain that gets worse or better when you eat.  You have belly pain that gets worse when you eat fatty foods.  You have a fever. GET HELP RIGHT AWAY IF:   The pain does not go away within 2 hours.  You keep throwing up (vomiting).  The pain changes and is only in the right or left part of the belly.  You have bloody or tarry looking poop. MAKE SURE YOU:   Understand these instructions.  Will watch your condition.  Will get help right away if you are not doing well or get worse.   This information is not intended to replace advice given to you by your health care provider. Make sure you discuss any questions you have with your health care provider.   Document Released: 04/13/2008 Document Revised: 11/16/2014 Document Reviewed: 07/05/2013 Elsevier Interactive Patient Education [employer].

## 2016-01-20 LAB — GC/CHLAMYDIA PROBE AMP (~~LOC~~) NOT AT ARMC
CHLAMYDIA, DNA PROBE: NEGATIVE
Neisseria Gonorrhea: NEGATIVE

## 2016-01-24 ENCOUNTER — Ambulatory Visit (INDEPENDENT_AMBULATORY_CARE_PROVIDER_SITE_OTHER): Payer: Medicaid Other | Admitting: Pediatrics

## 2016-01-24 VITALS — BP 104/60 | Temp 98.1°F | Wt 119.4 lb

## 2016-01-24 DIAGNOSIS — K5900 Constipation, unspecified: Secondary | ICD-10-CM | POA: Diagnosis not present

## 2016-01-24 DIAGNOSIS — R1032 Left lower quadrant pain: Secondary | ICD-10-CM

## 2016-01-24 DIAGNOSIS — M545 Low back pain, unspecified: Secondary | ICD-10-CM

## 2016-01-24 DIAGNOSIS — N281 Cyst of kidney, acquired: Secondary | ICD-10-CM

## 2016-01-24 DIAGNOSIS — R109 Unspecified abdominal pain: Secondary | ICD-10-CM | POA: Insufficient documentation

## 2016-01-24 HISTORY — DX: Cyst of kidney, acquired: N28.1

## 2016-01-24 MED ORDER — NAPROXEN SODIUM 220 MG PO CAPS: 220.0000 mg | ORAL_CAPSULE | Freq: Two times a day (BID) | ORAL | Status: DC

## 2016-01-24 MED ORDER — POLYETHYLENE GLYCOL 3350 17 GM/SCOOP PO POWD: ORAL | Status: DC

## 2016-01-24 NOTE — Progress Notes (Signed)
Subjective:    Natalie Wagner is a 20 y.o. old female here with her mother for Follow-up .    HPI  Abdominal pain/back pain:  She was seen in the ED for lower back pain and abdominal pain.  The pain has improved since she has been taking tylenol  Feels like the pain is on her left flank and LUQ.  Symptoms having been occuring for about a week.  She feels like she is bloating  Drinks lactose milk  This has been occurring for about a week.  She thinks it is staying the same.  Worse with exercise  Nothing seems to make it better  She denies any new activity  Denies any trauma or injury  Her stools have been hard.  She changed to cogentin about two months.  She gets those injections in her left buttock.  Has had constipation before.  Hasn't tried anything for constipation yet.  Denies any dysuria  Denies any sexual activity  Kidney cyst:  This was found on the Ultrasound that occurred in the ED.  Has had a cousin that received a kidney transplant but thinks it was related to DM2.  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  Prissy  has a past medical history of Migraines; Asthma; Schizophrenia ([place]); History of seizures as a child; Difficulty swallowing pills; Mass of finger of left hand (05/2014); Constipation (05/05/2013); Elevated  prolactin level ([place]) (12/27/2014); Galactorrhea (12/27/2014); and Hypertrophy, vulva (05/23/2013).     Objective:    BP 104/60 mmHg  Temp(Src) 98.1 F (36.7 C) (Temporal)  Wt 119 lb 6.4 oz (54.159 kg)  LMP 01/05/2016 Physical Exam  Constitutional: She is oriented to person, place, and time. She appears well-developed and well-nourished.  HENT:  Head: Normocephalic and atraumatic.  Eyes: EOM are normal.  Neck: Normal range of motion. Neck supple.  Cardiovascular: Normal rate, regular rhythm, normal heart sounds and intact distal pulses.   No murmur heard. Pulmonary/Chest: Effort normal and breath sounds normal. She has no wheezes. She has no rales.  Abdominal: Soft. Bowel sounds are normal. She exhibits no distension and no mass. There is no rebound and no guarding.  Mild tenderness in LLQ.    Neurological: She is alert and oriented to person, place, and time.  No apparent scoliosis  Tenderness to palpation in left lower back  Normal flexion and extension  Normal hip internal and external rotation  Normal hip flexion and extension  Normal knee flexion and extension  Left hip with 4/5 adduction and abduction  Right hip with 5/5/ addiction and abduction  Negative stork test  Neurovascularly intact  +2 DTR's patellar   Skin: Skin is warm. No rash noted.      Assessment and Plan:     Tiffanye was seen today for Follow-up .   Problem List Items Addressed This Visit      Unprioritized   Abdominal pain    Most likely related to  constipation  No suggestion of infection and work up in the ED was normal  Denies eating any dairy products  - miralax sent in        CN (constipation)   Relevant Medications   polyethylene glycol powder (GLYCOLAX/MIRALAX) powder   Low back pain    Most likely related to her weak pelvic muscles.  She also receives her cogentin injections into her buttocks which could contribute  Doubtful for spondylosis - sent aleve in to use for 14 days  -  provided home modalities  - if no improvement can consider formal PT       Relevant Medications   Naproxen Sodium 220 MG CAPS   Simple cyst of kidney - Primary    Found incidentally  No reported family history that is contributory  - follow up in 6 to 12 months to perform ultrasound          Return in about 6 months (around 07/26/2016).  Clearance Coots, MD

## 2016-01-24 NOTE — Assessment & Plan Note (Signed)
Found incidentally  No reported family history that is contributory  - follow up in 6 to 12 months to perform ultrasound

## 2016-01-24 NOTE — Assessment & Plan Note (Signed)
Most likely related to constipation  No suggestion of infection and work up in the ED was normal  Denies eating any dairy products  - miralax sent in

## 2016-01-24 NOTE — Patient Instructions (Signed)
Most likely your pain is related to constipation that is in your belly.   Your back pain is related to the muscle imbalance in your pelvis.   You can repeat a scan in 6 months for the simple cyst in your kidney.   You can continue to use tylenol for your back pain.   Back Pain, Adult Back pain is very common in adults.The cause of back pain is rarely dangerous and the pain often gets better over time.The cause of your back pain may not be known. Some common causes of back pain include:  Strain of the muscles or ligaments supporting the spine.  Wear and tear (degeneration) of the spinal disks.  Arthritis.  Direct injury to the back. For many people, back pain may return. Since back pain is rarely dangerous, most people can learn to manage this condition on their own. HOME CARE INSTRUCTIONS Watch your back pain for any changes. The following actions may help to lessen any discomfort you are feeling:  Remain active. It is stressful on your back to sit or stand in one place for long periods of time. Do not sit, drive, or stand in one place for more than 30 minutes at a time. Take short walks on even surfaces as soon as you are able.Try to increase the length of time you walk each day.  Exercise regularly as directed by your health care provider. Exercise helps your back heal faster. It also helps avoid future injury by keeping your muscles strong and flexible.  Do not stay in bed.Resting more than 1-2 days can delay your recovery.  Pay attention to your body when you bend and lift. The most comfortable positions are those that put less stress on your recovering back. Always use proper lifting techniques, including:  Bending your knees.  Keeping the load close to your body.  Avoiding twisting.  Find a comfortable position to sleep. Use a firm mattress and lie on your side with your knees slightly bent. If you lie on your back, put a pillow under your knees.  Avoid feeling anxious  or stressed.Stress increases muscle tension and can worsen back pain.It is important to recognize when you are anxious or stressed and learn ways to manage it, such as with exercise.  Take medicines only as directed by your health care provider. Over-the-counter medicines to reduce pain and inflammation are often the most helpful.Your health care provider may prescribe muscle relaxant drugs.These medicines help dull your pain so you can more quickly return to your normal activities and healthy exercise.  Apply ice to the injured area:  Put ice in a plastic bag.  Place a towel between your skin and the bag.  Leave the ice on for 20 minutes, 2-3 times a day for the first 2-3 days. After that, ice and heat may be alternated to reduce pain and spasms.  Maintain a healthy weight. Excess weight puts extra stress on your back and makes it difficult to maintain good posture. SEEK MEDICAL CARE IF:  You have pain that is not relieved with rest or medicine.  You have increasing pain going down into the legs or buttocks.  You have pain that does not improve in one week.  You have night pain.  You lose weight.  You have a fever or chills. SEEK IMMEDIATE MEDICAL CARE IF:   You develop new bowel or bladder control problems.  You have unusual weakness or numbness in your arms or legs.  You develop nausea or  vomiting.  You develop abdominal pain.  You feel faint.   This information is not intended to replace advice given to you by your health care provider. Make sure you discuss any questions you have with your health care provider.   Document Released: 10/26/2005 Document Revised: 11/16/2014 Document Reviewed: 02/27/2014 Elsevier Interactive Patient Education [employer].

## 2016-01-24 NOTE — Assessment & Plan Note (Signed)
Most likely related to her weak pelvic muscles.  She also receives her cogentin injections into her buttocks which could contribute  Doubtful for spondylosis - sent aleve in to use for 14 days  - provided home modalities  - if no improvement can consider formal PT

## 2016-01-31 ENCOUNTER — Encounter: Payer: Self-pay | Admitting: Pediatrics

## 2016-01-31 NOTE — Progress Notes (Signed)
Pre-Visit Planning  Natalie Wagner  is a 20 y.o. female referred by Sarajane Jews, MD.   Last seen in [facility] on 12/27/15 for PCOS.   Previous Psych Screenings? Yes  Treatment plan at last visit included start birth control patch to improve compliance.   Clinical Staff Visit Tasks:   - Urine GC/CT due? no - Psych Screenings Due? No - undress from waist down if still concerned about "clitoris sagging"   Provider Visit Tasks: - assess concerns - assess improvement of abodminal pain  - assess compliance with patch  - [facility] Involvement? Maybe - Pertinent Labs? Yes Admission on 01/19/2016, Discharged on 01/19/2016  Component Date Value  . Preg Test, Ur 01/19/2016 NEGATIVE   . Color, Urine 01/19/2016 AMBER*  . APPearance 01/19/2016 CLOUDY*  . Specific Gravity, Urine 01/19/2016 1.041*  . pH 01/19/2016 6.0   . Glucose, UA 01/19/2016 NEGATIVE   . Hgb urine dipstick 01/19/2016 NEGATIVE   . Bilirubin Urine 01/19/2016 SMALL*  . Ketones, ur 01/19/2016 >80*  . Protein, ur 01/19/2016 NEGATIVE   . Nitrite 01/19/2016 NEGATIVE   . Leukocytes, UA 01/19/2016 NEGATIVE   . Yeast Wet Prep HPF POC 01/19/2016 NONE SEEN   . Trich, Wet Prep 01/19/2016 NONE SEEN   . Clue Cells Wet Prep HPF * 01/19/2016 NONE SEEN   . WBC, Wet Prep HPF POC 01/19/2016 FEW*  . Sperm 01/19/2016 NONE SEEN   . Chlamydia 01/19/2016 Negative   . Neisseria gonorrhea 01/19/2016 Negative   Office Visit on 12/26/2015  Component Date Value  . Hgb A1c MFr Bld 12/26/2015 5.3   . Mean Plasma Glucose 12/26/2015 105   . Sodium 12/26/2015 141   . Potassium 12/26/2015 4.1   . Chloride 12/26/2015 102   . CO2 12/26/2015 26   . Glucose, Bld 12/26/2015 69   . BUN 12/26/2015 14   . Creat 12/26/2015 0.88   . Total Bilirubin 12/26/2015 0.7   . Alkaline Phosphatase 12/26/2015 43*  . AST 12/26/2015 37*  . ALT 12/26/2015 40*  . Total Protein 12/26/2015 6.7   . Albumin 12/26/2015 4.2   . Calcium 12/26/2015  9.2

## 2016-02-03 ENCOUNTER — Ambulatory Visit (INDEPENDENT_AMBULATORY_CARE_PROVIDER_SITE_OTHER): Payer: Medicaid Other | Admitting: Pediatrics

## 2016-02-03 ENCOUNTER — Encounter: Payer: Self-pay | Admitting: Pediatrics

## 2016-02-03 VITALS — BP 116/65 | HR 72 | Ht 59.0 in | Wt 118.2 lb

## 2016-02-03 DIAGNOSIS — K5900 Constipation, unspecified: Secondary | ICD-10-CM | POA: Diagnosis not present

## 2016-02-03 DIAGNOSIS — E282 Polycystic ovarian syndrome: Secondary | ICD-10-CM | POA: Diagnosis not present

## 2016-02-03 DIAGNOSIS — F201 Disorganized schizophrenia: Secondary | ICD-10-CM | POA: Diagnosis not present

## 2016-02-03 DIAGNOSIS — R300 Dysuria: Secondary | ICD-10-CM | POA: Diagnosis not present

## 2016-02-03 DIAGNOSIS — N906 Unspecified hypertrophy of vulva: Secondary | ICD-10-CM | POA: Diagnosis not present

## 2016-02-03 DIAGNOSIS — N898 Other specified noninflammatory disorders of vagina: Secondary | ICD-10-CM | POA: Diagnosis not present

## 2016-02-03 DIAGNOSIS — K581 Irritable bowel syndrome with constipation: Secondary | ICD-10-CM

## 2016-02-03 LAB — POCT URINALYSIS DIPSTICK
BILIRUBIN UA: NEGATIVE
Blood, UA: NEGATIVE
Glucose, UA: NEGATIVE
KETONES UA: NEGATIVE
Nitrite, UA: NEGATIVE
PH UA: 6
PROTEIN UA: NEGATIVE
SPEC GRAV UA: 1.02
Urobilinogen, UA: NEGATIVE

## 2016-02-03 NOTE — Progress Notes (Signed)
THIS RECORD MAY CONTAIN CONFIDENTIAL INFORMATION THAT SHOULD NOT BE RELEASED WITHOUT REVIEW OF THE SERVICE PROVIDER.  Adolescent Medicine Consultation Follow-Up Visit Natalie Wagner  is a 20 y.o. female referred by No ref. provider found here today for follow-up.    Previsit planning completed:  yes  Pre-Visit Planning  Natalie Wagner is a 20 y.o. female referred by Sarajane Jews, MD.  Last seen in [facility] on 12/27/15 for PCOS.   Previous Psych Screenings? Yes  Treatment plan at last visit included start birth control patch to improve compliance.   Clinical Staff Visit Tasks:  - Urine GC/CT due? no - Psych Screenings Due? No - undress from waist down if still concerned about "clitoris sagging"   Provider Visit Tasks: - assess concerns - assess improvement of abodminal pain  - assess compliance with patch  - [facility] Involvement? Maybe - Pertinent Labs? Yes Admission on 01/19/2016, Discharged on 01/19/2016  Component Date Value  . Preg Test, Ur 01/19/2016 NEGATIVE   . Color, Urine 01/19/2016 AMBER*  . APPearance 01/19/2016 CLOUDY*  . Specific Gravity, Urine 01/19/2016 1.041*  . pH 01/19/2016 6.0   . Glucose, UA 01/19/2016 NEGATIVE   . Hgb urine dipstick 01/19/2016 NEGATIVE   . Bilirubin Urine 01/19/2016 SMALL*  . Ketones, ur 01/19/2016 >80*  . Protein, ur 01/19/2016 NEGATIVE   . Nitrite 01/19/2016 NEGATIVE   . Leukocytes, UA 01/19/2016 NEGATIVE   . Yeast Wet Prep HPF POC 01/19/2016 NONE SEEN   . Trich, Wet Prep 01/19/2016 NONE SEEN   . Clue Cells Wet Prep HPF * 01/19/2016 NONE SEEN   . WBC, Wet Prep HPF POC 01/19/2016 FEW*  . Sperm 01/19/2016 NONE SEEN   . Chlamydia 01/19/2016 Negative   . Neisseria gonorrhea 01/19/2016 Negative   Office Visit on 12/26/2015  Component Date Value  . Hgb A1c MFr Bld 12/26/2015 5.3   . Mean Plasma Glucose  12/26/2015 105   . Sodium 12/26/2015 141   . Potassium 12/26/2015 4.1   . Chloride 12/26/2015 102   . CO2 12/26/2015 26   . Glucose, Bld 12/26/2015 69   . BUN 12/26/2015 14   . Creat 12/26/2015 0.88   . Total Bilirubin 12/26/2015 0.7   . Alkaline Phosphatase 12/26/2015 43*  . AST 12/26/2015 37*  . ALT 12/26/2015 40*  . Total Protein 12/26/2015 6.7   . Albumin 12/26/2015 4.2   . Calcium 12/26/2015 9.2             Growth Chart Viewed? yes   History was provided by the patient and mother.  PCP Confirmed?  yes  My Chart Activated?   yes   HPI:   Hoping to start at [facility]. Has to finish common core first.  Has a list:  Wants to know about her sagging clitoris. Had surgery last year but feels like it is sagging down more.  Did not start patches last time.  Having bad cramping- started back on the pill. She is not taking them consistently. She forgets a lot. She has a period at the same every month. Does not have spotting. Period lasts 5-6 days. Cramping on the first 2 days.  She is having vaginal discharge and is using a lot of soap down there after the gym.  Has not had any further galactorrhea.  Wants to start metformin because she feels like her GI tract is feeling "slow." Doing miralax to help but not consistent with it. She is pooping about 1-2 times she  says at first a day, but then a week.  She notes her appetite is increasing with abilify which she doesn't like. She is having insomnia. She has taken melatonin before but not any time recently.  She is concerned that she has a "worm" in her ear because she listens to her music too loud.    Review of Systems  Constitutional: Negative for weight loss and malaise/fatigue.  Eyes: Negative for blurred vision.  Respiratory: Negative for shortness of breath.   Cardiovascular: Negative for chest pain and palpitations.  Gastrointestinal: Positive for constipation. Negative  for nausea, vomiting and abdominal pain.  Genitourinary: Positive for dysuria.  Musculoskeletal: Negative for myalgias.  Neurological: Negative for dizziness and headaches.  Psychiatric/Behavioral: Negative for depression. The patient has insomnia.      Patient's last menstrual period was 01/05/2016 (approximate). Allergies  Allergen Reactions  . Lactose Intolerance (Gi) Diarrhea   Outpatient Encounter Prescriptions as of 02/03/2016  Medication Sig Note  . albuterol (PROAIR HFA) 108 (90 BASE) MCG/ACT inhaler Inhale 2 puffs into the lungs every 4 (four) hours as needed for wheezing or shortness of breath.   . ARIPiprazole 400 MG SUSR Inject 400 mg into the muscle every 30 (thirty) days.   Marland Kitchen BENZACLIN gel Apply topically every morning.   . benztropine (COGENTIN) 1 MG tablet Take 1 tablet (1 mg total) by mouth daily.   . cetirizine (ZYRTEC) 10 MG tablet Take 1 tablet (10 mg total) by mouth daily. For allergy symptoms   . hyoscyamine (LEVSIN SL) 0.125 MG SL tablet Take 0.125 mg by mouth every 4 (four) hours as needed for cramping. Reported on 01/16/2016 05/15/2014: Received from: [facility]  . polyethylene glycol powder (GLYCOLAX/MIRALAX) powder Take 1 capful once daily   . SPRINTEC 28 0.25-35 MG-MCG tablet Take 1 tablet by mouth daily.  01/19/2016: Received from: External Pharmacy  . Naproxen Sodium 220 MG CAPS Take 1 capsule (220 mg total) by mouth 2 (two) times daily. (Patient not taking: Reported on 02/03/2016)   . Vitamin D, Ergocalciferol, (DRISDOL) 50000 UNITS CAPS capsule Take 1 capsule (50,000 Units total) by mouth every 7 (seven) days. (Patient not taking: Reported on 02/03/2016) 01/19/2016: Mondays   No facility-administered encounter medications on file as of 02/03/2016.     Patient Active Problem List   Diagnosis Date Noted  . Abdominal pain 01/24/2016  . Low back pain 01/24/2016  . Simple cyst of kidney 01/24/2016  . Convulsions ([place]) 09/03/2015  .  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 Acanthosis 05/20/2015  . CN (constipation) 12/27/2014  . Therapeutic drug monitoring 06/03/2014  . Hyperlipidemia LDL goal <130 05/23/2014  . Lactose intolerance documented with breath testing 10/25/2013  . Finger mass, left 08/30/2013  . Irritable bowel syndrome 05/23/2013  . 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 Acne vulgaris 05/05/2013  . Allergic rhinitis 05/05/2013  . PCOS (polycystic ovarian syndrome) 05/05/2013  . BMI (body mass index), pediatric, > 99% for age 48/27/2014  . Snoring 05/05/2013  . 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  Physical Exam:  Filed Vitals:   02/03/16 1031  BP: 116/65  Pulse: 72  Height: 4\' 11"  (1.499 m)  Weight: 118 lb 3.2 oz (53.615 kg)   BP 116/65 mmHg  Pulse 72  Ht 4\' 11"  (1.499 m)  Wt 118 lb 3.2 oz (53.615 kg)  BMI 23.86 kg/m2  LMP 01/05/2016 (Approximate) Body mass index: body mass index is 23.86 kg/(m^2). Blood pressure percentiles are [id number] systolic and 123456 diastolic based on [id number] NHANES data. Blood pressure percentile targets: 90: 120/76, 95: 123/80, 99 + 5 mmHg: 136/93.  Physical Exam  Constitutional: She is oriented to person, place, and time. She appears well-developed and well-nourished.  HENT:  Head: Normocephalic.  Neck: No thyromegaly present.  Cardiovascular: Normal rate, regular rhythm, normal heart sounds and intact distal pulses.   Pulmonary/Chest: Effort normal and breath sounds normal.  Abdominal: Soft. Bowel sounds are normal. There is no tenderness.  Genitourinary: Vaginal discharge found.  Mild labial hypertrophy extending toward the retum. Unable to appreciate her description of "clitoral sagging." Clitoris  appears mildly enlarged and consistent with past exams.   Musculoskeletal: Normal range of motion.  Neurological: She is alert and oriented to person, place, and time.  Skin: Skin is warm and dry.  Psychiatric: She has a normal mood and affect.  Difficulty following 2 step directions Difficulty giving consistent history     Assessment/Plan: 1. Hypertrophy, vulva Has a follow up appointment already scheduled in June with surgery. Although she has some continued mild hypertrophy, it is nothing that appears to need surgical management at this time.   2. 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 She had difficulty following directions and giving history. She was concerned there was a worm in her ear due to her earbuds and loud music. Continues with psychiatry. She is getting injectable abilify at this time.   3. Irritable bowel syndrome with constipation She has IBS and issues with lactose although she eats it anyway. Had difficulty telling me how many times she had stool weekly.   4. Constipation, unspecified constipation type Inconsistent with miralax. Wants metformin for this problem but just needs to take miralax consistently.   5. PCOS (polycystic ovarian syndrome) Is not taking OCP consistently still but does not want to start patch as she is worried about washing it and having too much medicine get into her skin. We discussed taking it consistently will help with cramps and bleeding and that I can't make a change to pill type today if she isn't consistent with it.   6. Vaginal discharge Small discharge noted on external exam. Will swab. Has had BV in the past and overuses soaps now.  - WET PREP BY MOLECULAR PROBE - GC/Chlamydia Probe Amp  7. Dysuria UA looks ok. Likely related to vaginal discharge. Will monitor.  - POCT urinalysis dipstick   Follow-up:  2 months    Medical decision-making:  > 40 minutes  spent, more than 50% of appointment was spent discussing diagnosis and management of symptoms

## 2016-02-03 NOTE — Patient Instructions (Addendum)
-   We have swabbed you for your vaginal discharge today. We will call you with the results. Your urine looked ok today.  - Please start taking your birth control pills more consistently for at least 1 month to help me better assess your cramping. We can change the medication but nothing is going to help well if you aren't taking it every day.  - If you continue to have concerns about labial enlargement visit the surgeon again so they can evaluate it further. To me it looks ok today.  - continue good vaginal hygiene practices  - restart your miralax for constipation. You need to take it every day.  - follow up with your psychiatrist about insomnia. Start taking melatonin every day at bedtime.   Healthy vaginal hygiene practices   -  Avoid sleeper pajamas. Nightgowns allow air to circulate.  Sleep without underpants whenever possible.  -  Wear cotton underpants during the day. Double-rinse underwear after washing to avoid residual irritants. Do not use fabric softeners for underwear and swimsuits.  - Avoid tights, leotards, leggings, "skinny" jeans, and other tight-fitting clothing. Skirts and loose-fitting pants allow air to circulate.  - Avoid pantyliners.  Instead use tampons or cotton pads.  - Daily warm bathing is helpful:     - Soak in clean water (no soap) for 10 to 15 minutes. Adding vinegar or baking soda to the water has not been specifically studied and may not be better than clean water alone.      - Use soap to wash regions other than the genital area just before getting out of the tub. Limit use of any soap on genital areas. Use fragance-free soaps.     - Rinse the genital area well and gently pat dry.  Don't rub.  Hair dryer to assist with drying can be used only if on cool setting.     - Do not use bubble baths or perfumed soaps.  - Do not use any feminine sprays, douches or powders.  These contain chemicals that will irritate the skin.  - If the genital area is tender or  swollen, cool compresses may relieve the discomfort. Unscented wet wipes can be used instead of toilet paper for wiping.   - Emollients, such as Vaseline, may help protect skin and can be applied to the irritated area.  - Always remember to wipe front-to-back after bowel movements. Pat dry after urination.  - Do not sit in wet swimsuits for long periods of time after swimming

## 2016-02-04 LAB — GC/CHLAMYDIA PROBE AMP
CT PROBE, AMP APTIMA: NOT DETECTED
GC PROBE AMP APTIMA: NOT DETECTED

## 2016-02-05 ENCOUNTER — Ambulatory Visit (INDEPENDENT_AMBULATORY_CARE_PROVIDER_SITE_OTHER): Payer: Medicaid Other | Admitting: Neurology

## 2016-02-05 ENCOUNTER — Encounter: Payer: Self-pay | Admitting: Neurology

## 2016-02-05 VITALS — BP 110/72 | HR 62 | Resp 16 | Wt 119.0 lb

## 2016-02-05 DIAGNOSIS — R569 Unspecified convulsions: Secondary | ICD-10-CM

## 2016-02-05 DIAGNOSIS — F203 Undifferentiated schizophrenia: Secondary | ICD-10-CM

## 2016-02-05 NOTE — Progress Notes (Signed)
NEUROLOGY FOLLOW UP OFFICE NOTE  [name] RS:[id number]  HISTORY OF PRESENT ILLNESS: I had the pleasure of seeing Natalie Wagner in follow-up in the neurology clinic on 02/05/2016. She is again accompanied by her mother who helps supplement the history today. The patient was last seen 4 months ago for new onset seizure on 08/26/15. Her mother reported concern for seizures as an infant, but she was never started on seizure medication. I personally reviewed MRI brain with and without contrast which was normal, hippocampi symmetric with no abnormal signal or enhancement seen. Her 1-hour sleep-deprived EEG, as well as 24-hour EEG were normal. Typical events were not captured. No seizures or seizure-like symptoms since 08/26/15. She reports that she cannot sleep and cannot think, she has a follow-up with her psychiatrist next week. She gets Abilify injections every month. She reports "zoning out," but states she did not have these during the EEG because she was focusing. She denies any headaches, dizziness, focal numbness/tingling/weakness, falls.  HPI: This is a 20 yo RH woman with a history of schizophrenia who presented after a seizure last 08/26/15. She recalls waking up then starting to feel dizzy and lightheaded, then heard her mother screaming and waking up on the couch. Her mother reports she lost consciousness and started shaking, rocking side to side. No tongue bite or incontinence. She was brought to [facility] ER where CBC and BMP were normal, no urine drug screen done. I personally reviewed head CT without contrast which was normal. Her mother reports she was born premature at 11 weeks and had delayed development with occupation and speech therapy as a child. She was in special education classes. As a baby, she would have episodes where she would get stiff as a board and shake, then in the 2nd grade she passed out with shaking and incontinence. She also had brief staring spells occurring around 3  times a week. She had seen pediatric neurologist Dr. Gaynell Face at that time, mother is unsure of diagnosis but denies any treatment for seizures. There is note that symptoms were felt to be due to migraines. The last staring spell was in the 7th grade. She was diagnosed with schizophrenia in the 9th grade, she was having visual hallucinations and would forget who she was, suddenly drinking from a fountain and spitting it out. She was admitted to inpatient psychiatry for 2 weeks and has been taking Zyprexa for the past 3 years. She denies any more hallucinations, but her mother is concerned about "strange behaviors" the past month. In the waiting room today, she was staring at the form and her mother told her to write something down. She then wrote over what her mother had filled out. She talks as if there is a third person in the room, sometimes she "just talks and does not know it." She closes and opens doors repetitively. She lives with her mother and denies any alcohol intake. She reports good sleep.   Epilepsy Risk Factors: She was born at 35 weeks with developmental delay. There is no history of febrile convulsions, CNS infections such as meningitis/encephalitis, significant traumatic brain injury, neurosurgical procedures, or family history of seizures.  Diagnostic Data: MRI brain with and without contrast done 01/14/15 for galactorrhea. I personally reviewed images, no acute changes, hippocampi symmetric with no abnormal signal or enhancement seen. Pituitary gland normal. EEG as above   PAST MEDICAL HISTORY: Past Medical History  Diagnosis Date  . Migraines   . Asthma     prn  inhaler  . Schizophrenia ([place])   . History of seizures as a child     mother states were triggered by migraines; no seizures in > 7 yr.  . Difficulty swallowing pills   . 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 Constipation 05/05/2013  . Elevated prolactin level ([place]) 12/27/2014  .  Galactorrhea 12/27/2014  . 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    MEDICATIONS: Current Outpatient Prescriptions on File Prior to Visit  Medication Sig Dispense Refill  . albuterol (PROAIR HFA) 108 (90 BASE) MCG/ACT inhaler Inhale 2 puffs into the lungs every 4 (four) hours as needed for wheezing or shortness of breath. 2 Inhaler 11  . ARIPiprazole 400 MG SUSR Inject 400 mg into the muscle every 30 (thirty) days. 1 each 4  . BENZACLIN gel Apply topically every morning. 25 g 11  . benztropine (COGENTIN) 1 MG tablet Take 1 tablet (1 mg total) by mouth daily. 30 tablet 3  . cetirizine (ZYRTEC) 10 MG tablet Take 1 tablet (10 mg total) by mouth daily. For allergy symptoms 30 tablet 11  . hyoscyamine (LEVSIN SL) 0.125 MG SL tablet Take 0.125 mg by mouth every 4 (four) hours as needed for cramping. Reported on 01/16/2016    . Naproxen Sodium 220 MG CAPS Take 1 capsule (220 mg total) by mouth 2 (two) times daily. (Patient not taking: Reported on 02/03/2016) 28 each 0  . polyethylene glycol powder (GLYCOLAX/MIRALAX) powder Take 1 capful once daily 255 g 5  . SPRINTEC 28 0.25-35 MG-MCG tablet Take 1 tablet by mouth daily.   11  . Vitamin D, Ergocalciferol, (DRISDOL) 50000 UNITS CAPS capsule Take 1 capsule (50,000 Units total) by mouth every 7 (seven) days. (Patient not taking: Reported on 02/03/2016) 12 capsule 0   No current facility-administered medications on file prior to visit.    ALLERGIES: Allergies  Allergen Reactions  . Lactose Intolerance (Gi) Diarrhea    FAMILY HISTORY: Family History  Problem Relation Age of Onset  . Asthma Mother   . Diabetes Maternal Grandfather   . Hypertension Maternal Grandfather   . Heart disease Maternal Grandfather   . Kidney disease Maternal Grandfather   . Anesthesia problems Maternal Grandfather     hx. of being hard to wake up post-op  . Hypertension Maternal Uncle   . Hypertension Maternal Grandmother   . Stroke Maternal Grandmother     SOCIAL  HISTORY: Social History   Social History  . Marital Status: Single    Spouse Name: N/A  . Number of Children: N/A  . Years of Education: N/A   Occupational History  . Not on file.   Social History Main Topics  . Smoking status: Never Smoker   . Smokeless tobacco: Never Used  . Alcohol Use: No  . Drug Use: No  . Sexual Activity: No   Other Topics Concern  . Not on file   Social History Narrative    REVIEW OF SYSTEMS: Constitutional: No fevers, chills, or sweats, no generalized fatigue, change in appetite Eyes: No visual changes, double vision, eye pain Ear, nose and throat: No hearing loss, ear pain, nasal congestion, sore throat Cardiovascular: No chest pain, palpitations Respiratory:  No shortness of breath at rest or with exertion, wheezes GastrointestinaI: No nausea, vomiting, diarrhea, abdominal pain, fecal incontinence Genitourinary:  No dysuria, urinary retention or frequency Musculoskeletal:  No neck pain, back pain Integumentary: No rash, pruritus, skin lesions Neurological: as above Psychiatric: No depression,  insomnia, anxiety Endocrine: No palpitations, fatigue, diaphoresis, mood swings, change in appetite, change in weight, increased thirst Hematologic/Lymphatic:  No anemia, purpura, petechiae. Allergic/Immunologic: no itchy/runny eyes, nasal congestion, recent allergic reactions, rashes  PHYSICAL EXAM: Filed Vitals:   02/05/16 1022  BP: 110/72  Pulse: 62   General: No acute distress Head:  Normocephalic/atraumatic Neck: supple, no paraspinal tenderness, full range of motion Heart:  Regular rate and rhythm Lungs:  Clear to auscultation bilaterally Back: No paraspinal tenderness Skin/Extremities: No rash, no edema Neurological Exam: alert and oriented to person, place, and time. No aphasia or dysarthria. Fund of knowledge is appropriate.  Recent and remote memory are intact.  Attention and concentration are normal.    Able to name objects and repeat  phrases. Cranial nerves: Pupils equal, round, reactive to light.  Extraocular movements intact with no nystagmus. Visual fields full. Facial sensation intact. No facial asymmetry. Tongue, uvula, palate midline.  Motor: Bulk and tone normal, muscle strength 5/5 throughout with no pronator drift.  Sensation to light touch intact.  No extinction to double simultaneous stimulation.  Deep tendon reflexes 2+ throughout, toes downgoing.  Finger to nose testing intact.  Gait narrow-based and steady, able to tandem walk adequately.  Romberg negative.  IMPRESSION: This is a 20 yo RH woman with a history of schizophrenia, with a witnessed convulsion last 08/26/15. Her mother reported episodes concerning for possible seizures as a child (staring spells, stiffening episodes), however she was never started on seizure medication by pediatric neurology. Her 24-hour EEG is normal. MRI brain normal. We  Again discussed that after an initial seizure, unless there are significant risk factors, an abnormal neurological exam, an EEG showing epileptiform abnormalities, and/or abnormal neuroimaging, treatment with an antiepileptic drug is not indicated. At this point, we have agreed to hold off on starting medication, they know to call our office for any change in symptoms. She is aware of [place] driving laws to stop driving after a seizure, until 6 months seizure-free.She is not driving.  Follow-up with psychiatry for schizophrenia. She will follow-up in 6 months and knows to call our office for any changes in the interim.   Thank you for allowing me to participate in her care.  Please do not hesitate to call for any questions or concerns.  The duration of this appointment visit was 15 minutes of face-to-face time with the patient.  Greater than 50% of this time was spent in counseling, explanation of diagnosis, planning of further management, and coordination of care.   Ellouise Newer, M.D.   CC: Dr. Abby Potash

## 2016-02-05 NOTE — Patient Instructions (Signed)
Looking good. Continue follow-up with your psychiatrist and discuss your symptoms. Follow-up in 6 months, call for any changes.  Seizure Precautions: 1. If medication has been prescribed for you to prevent seizures, take it exactly as directed.  Do not stop taking the medicine without talking to your doctor first, even if you have not had a seizure in a long time.   2. Avoid activities in which a seizure would cause danger to yourself or to others.  Don't operate dangerous machinery, swim alone, or climb in high or dangerous places, such as on ladders, roofs, or girders.  Do not drive unless your doctor says you may.  3. If you have any warning that you may have a seizure, lay down in a safe place where you can't hurt yourself.    4.  No driving for 6 months from last seizure, as per [facility].   Please refer to the following link on the [place] website for more information: http://[email or web address]/[email or web address]/[email or web address]   5.  Maintain good sleep hygiene. Avoid alcohol.  6.  Notify your neurology if you are planning pregnancy or if you become pregnant.  7.  Contact your doctor if you have any problems that may be related to the medicine you are taking.  8.  Call 911 and bring the patient back to the ED if:        A.  The seizure lasts longer than 5 minutes.       B.  The patient doesn't awaken shortly after the seizure  C.  The patient has new problems such as difficulty seeing, speaking or moving  D.  The patient was injured during the seizure  E.  The patient has a temperature over 102 F (39C)  F.  The patient vomited and now is having trouble breathing

## 2016-02-06 ENCOUNTER — Other Ambulatory Visit: Payer: Self-pay | Admitting: Pediatrics

## 2016-02-06 ENCOUNTER — Telehealth: Payer: Self-pay | Admitting: *Deleted

## 2016-02-06 DIAGNOSIS — B9689 Other specified bacterial agents as the cause of diseases classified elsewhere: Secondary | ICD-10-CM

## 2016-02-06 DIAGNOSIS — N760 Acute vaginitis: Principal | ICD-10-CM

## 2016-02-06 LAB — WET PREP BY MOLECULAR PROBE
Candida species: NEGATIVE
GARDNERELLA VAGINALIS: POSITIVE — AB
Trichomonas vaginosis: NEGATIVE

## 2016-02-06 MED ORDER — METRONIDAZOLE 0.75 % VA GEL: VAGINAL | Status: DC

## 2016-02-06 MED ORDER — METRONIDAZOLE 500 MG PO TABS: 500.0000 mg | ORAL_TABLET | Freq: Two times a day (BID) | ORAL | Status: DC

## 2016-02-06 NOTE — Telephone Encounter (Signed)
TC to mom who gave pt's personal cell phone number.   TC to pt. LVM on confidential phone that pt is positive for BV again. Will do flagyl BID x 7 days and then metronidazole gel twice weekly. Advised both rx sent to pharmacy. Provided office phone number for callback for discussion.

## 2016-02-06 NOTE — Telephone Encounter (Signed)
-----   Message from Trude Mcburney, [place] sent at 02/06/2016  2:00 PM EDT ----- Positive for BV again. Will do flagyl BID x 7 days and then metronidazole gel twice weekly 1 applicator full vaginally at bedtime for 6 months given that she has never cleared BV in the past. Both rx sent to pharmacy. Continue good vaginal hygiene practices.

## 2016-02-13 ENCOUNTER — Ambulatory Visit (INDEPENDENT_AMBULATORY_CARE_PROVIDER_SITE_OTHER): Payer: Medicaid Other | Admitting: Pediatrics

## 2016-02-13 ENCOUNTER — Encounter: Payer: Self-pay | Admitting: Pediatrics

## 2016-02-13 ENCOUNTER — Ambulatory Visit (HOSPITAL_COMMUNITY): Payer: Self-pay | Admitting: Psychiatry

## 2016-02-13 VITALS — Temp 98.1°F | Wt 118.8 lb

## 2016-02-13 DIAGNOSIS — N760 Acute vaginitis: Secondary | ICD-10-CM

## 2016-02-13 DIAGNOSIS — R1030 Lower abdominal pain, unspecified: Secondary | ICD-10-CM

## 2016-02-13 DIAGNOSIS — Z3202 Encounter for pregnancy test, result negative: Secondary | ICD-10-CM | POA: Diagnosis not present

## 2016-02-13 DIAGNOSIS — Z1389 Encounter for screening for other disorder: Secondary | ICD-10-CM

## 2016-02-13 DIAGNOSIS — A499 Bacterial infection, unspecified: Secondary | ICD-10-CM | POA: Diagnosis not present

## 2016-02-13 DIAGNOSIS — B9689 Other specified bacterial agents as the cause of diseases classified elsewhere: Secondary | ICD-10-CM

## 2016-02-13 LAB — POCT URINALYSIS DIPSTICK
Bilirubin, UA: NEGATIVE
Blood, UA: NEGATIVE
GLUCOSE UA: NEGATIVE
LEUKOCYTES UA: NEGATIVE
NITRITE UA: NEGATIVE
UROBILINOGEN UA: NEGATIVE
pH, UA: 7

## 2016-02-13 LAB — POCT URINE PREGNANCY: Preg Test, Ur: NEGATIVE

## 2016-02-13 NOTE — Patient Instructions (Signed)
Complete your entire course of metronidazole.  You have an appointment at the end of the month with OB/GYN.   Healthy vaginal hygiene practices   -  Avoid sleeper pajamas. Nightgowns allow air to circulate.  Sleep without underpants whenever possible.  -  Wear cotton underpants during the day. Double-rinse underwear after washing to avoid residual irritants. Do not use fabric softeners for underwear and swimsuits.  - Avoid tights, leotards, leggings, "skinny" jeans, and other tight-fitting clothing. Skirts and loose-fitting pants allow air to circulate.  - Avoid pantyliners.  Instead use tampons or cotton pads.  - Daily warm bathing is helpful:     - Soak in clean water (no soap) for 10 to 15 minutes. Adding vinegar or baking soda to the water has not been specifically studied and may not be better than clean water alone.      - Use soap to wash regions other than the genital area just before getting out of the tub. Limit use of any soap on genital areas. Use fragance-free soaps.     - Rinse the genital area well and gently pat dry.  Don't rub.  Hair dryer to assist with drying can be used only if on cool setting.     - Do not use bubble baths or perfumed soaps.  - Do not use any feminine sprays, douches or powders.  These contain chemicals that will irritate the skin.  - If the genital area is tender or swollen, cool compresses may relieve the discomfort. Unscented wet wipes can be used instead of toilet paper for wiping.   - Emollients, such as Vaseline, may help protect skin and can be applied to the irritated area.  - Always remember to wipe front-to-back after bowel movements. Pat dry after urination.  - Do not sit in wet swimsuits for long periods of time after swimming

## 2016-02-13 NOTE — Progress Notes (Signed)
  Subjective:    Natalie Wagner is a 20 y.o. old female here with Natalie Wagner for Pelvic Pain and Back Pain .    HPI  Had some discharge on 02/03/16 appoinemtn - wet prep positive for BV so given rx for metronidazole, which she started 2 days ago.  Abdominal pain starting months ago, also having low back pain.   Has been seen in the ED several times for the same pain - most recently on 01/19/16 - had u/s of abdomen/pelvis done at that time and normal except for a small cyst on kidney.    Has lots of questions about how to use the intravaginal metronidazole gel.   Unclear from both Natalie Wagner and Natalie Wagner what made Natalie request an appointment today. She woke Natalie Wagner up at 3 am complaining about the pain - not clear if pain actually woke Natalie from sleep or if she was up already at that time.   Has a h/o IBS but she reports that she is not currently on medication for it.  Unclear exact stoolign history - states that she is "going fine now" and has not had trouble recently with constipation.   H/o labial surgery and is scheduled to follow up with GYN at [facility] later this month.    Review of Systems  Constitutional: Negative for fever, activity change and appetite change.  Gastrointestinal: Negative for constipation and blood in stool.  Genitourinary: Negative for dysuria, vaginal discharge, vaginal pain and dyspareunia.    Immunizations needed: none     Objective:    Temp(Src) 98.1 F (36.7 C) (Temporal)  Wt 118 lb 12.8 oz (53.887 kg)  LMP 01/05/2016 (Approximate) Physical Exam  Constitutional: She appears well-developed and well-nourished.  Cardiovascular: Normal rate.   Pulmonary/Chest: Effort normal.  Abdominal: Soft.  Some pain to deep palpation suprapubically and in LLQ  Genitourinary: Vagina normal.  Normal external genitalia - no erythema, no discharge, no foul odor       Assessment and Plan:     Natalie Wagner was seen today for Pelvic Pain and Back Pain .   Problem List Items  Addressed This Visit    None    Visit Diagnoses    Pregnancy test negative    -  Primary    Relevant Orders    POCT urine pregnancy (Completed)    Screening for genitourinary condition        Relevant Orders    POCT urinalysis dipstick (Completed)      Lower abdominal pain - has been extensively worked up in the past and has recently had relatively normal imaging. Complete treated for BV. Use of vaginal clear reviewed. U/A done and no UTI. UPT done and negative.   Has scheduled f/u with GYN and adolescent clinic. Return precautions reviewed.   Follow up as needed.   Royston Cowper, MD

## 2016-02-14 ENCOUNTER — Other Ambulatory Visit (HOSPITAL_COMMUNITY): Payer: Self-pay | Admitting: Psychiatry

## 2016-02-14 ENCOUNTER — Emergency Department (HOSPITAL_COMMUNITY)
Admission: EM | Admit: 2016-02-14 | Discharge: 2016-02-14 | Disposition: A | Discharge status: Home / Self Care | Payer: Medicaid Other | Source: Home / Self Care | Attending: Emergency Medicine | Admitting: Emergency Medicine

## 2016-02-14 ENCOUNTER — Encounter (HOSPITAL_COMMUNITY): Payer: Self-pay | Admitting: Emergency Medicine

## 2016-02-14 ENCOUNTER — Emergency Department (HOSPITAL_COMMUNITY): Payer: Medicaid Other

## 2016-02-14 ENCOUNTER — Emergency Department (HOSPITAL_COMMUNITY)
Admission: EM | Admit: 2016-02-14 | Discharge: 2016-02-14 | Disposition: A | Discharge status: Home / Self Care | Payer: Medicaid Other | Attending: Emergency Medicine | Admitting: Emergency Medicine

## 2016-02-14 ENCOUNTER — Encounter (HOSPITAL_COMMUNITY): Payer: Self-pay

## 2016-02-14 DIAGNOSIS — R1032 Left lower quadrant pain: Secondary | ICD-10-CM | POA: Diagnosis present

## 2016-02-14 DIAGNOSIS — Z8639 Personal history of other endocrine, nutritional and metabolic disease: Secondary | ICD-10-CM | POA: Insufficient documentation

## 2016-02-14 DIAGNOSIS — Z8679 Personal history of other diseases of the circulatory system: Secondary | ICD-10-CM | POA: Insufficient documentation

## 2016-02-14 DIAGNOSIS — F209 Schizophrenia, unspecified: Secondary | ICD-10-CM | POA: Insufficient documentation

## 2016-02-14 DIAGNOSIS — J45909 Unspecified asthma, uncomplicated: Secondary | ICD-10-CM

## 2016-02-14 DIAGNOSIS — Z8742 Personal history of other diseases of the female genital tract: Secondary | ICD-10-CM | POA: Insufficient documentation

## 2016-02-14 DIAGNOSIS — Z3202 Encounter for pregnancy test, result negative: Secondary | ICD-10-CM | POA: Insufficient documentation

## 2016-02-14 DIAGNOSIS — R300 Dysuria: Secondary | ICD-10-CM | POA: Insufficient documentation

## 2016-02-14 DIAGNOSIS — Z8719 Personal history of other diseases of the digestive system: Secondary | ICD-10-CM | POA: Insufficient documentation

## 2016-02-14 DIAGNOSIS — Z79899 Other long term (current) drug therapy: Secondary | ICD-10-CM | POA: Diagnosis not present

## 2016-02-14 DIAGNOSIS — K5901 Slow transit constipation: Secondary | ICD-10-CM

## 2016-02-14 DIAGNOSIS — R1084 Generalized abdominal pain: Secondary | ICD-10-CM | POA: Insufficient documentation

## 2016-02-14 LAB — URINALYSIS, ROUTINE W REFLEX MICROSCOPIC
BILIRUBIN URINE: NEGATIVE
Glucose, UA: NEGATIVE mg/dL
Hgb urine dipstick: NEGATIVE
KETONES UR: NEGATIVE mg/dL
NITRITE: NEGATIVE
PH: 6 (ref 5.0–8.0)
PROTEIN: NEGATIVE mg/dL
Specific Gravity, Urine: 1.011 (ref 1.005–1.030)

## 2016-02-14 LAB — POC URINE PREG, ED: Preg Test, Ur: NEGATIVE

## 2016-02-14 LAB — URINE MICROSCOPIC-ADD ON

## 2016-02-14 MED ORDER — PEG 3350-KCL-NABCB-NACL-NASULF 236 G PO SOLR: 4000.0000 mL | Freq: Once | ORAL | Status: DC

## 2016-02-14 NOTE — ED Notes (Signed)
Bed: [id number] Expected date:  Expected time:  Means of arrival:  Comments: EMS 19 yo female RLQ abdominal pain/chronic

## 2016-02-14 NOTE — Discharge Instructions (Signed)
Drink more water, use the Miralax daily add probiotics and high fiber diet   Constipation, Adult Constipation is when a person:  Poops (has a bowel movement) less than 3 times a week.  Has a hard time pooping.  Has poop that is dry, hard, or bigger than normal. HOME CARE   Eat foods with a lot of fiber in them. This includes fruits, vegetables, beans, and whole grains such as brown rice.  Avoid fatty foods and foods with a lot of sugar. This includes french fries, hamburgers, cookies, candy, and soda.  If you are not getting enough fiber from food, take products with added fiber in them (supplements).  Drink enough fluid to keep your pee (urine) clear or pale yellow.  Exercise on a regular basis, or as told by your doctor.  Go to the restroom when you feel like you need to poop. Do not hold it.  Only take medicine as told by your doctor. Do not take medicines that help you poop (laxatives) without talking to your doctor first. GET HELP RIGHT AWAY IF:   You have bright red blood in your poop (stool).  Your constipation lasts more than 4 days or gets worse.  You have belly (abdominal) or butt (rectal) pain.  You have thin poop (as thin as a pencil).  You lose weight, and it cannot be explained. MAKE SURE YOU:   Understand these instructions.  Will watch your condition.  Will get help right away if you are not doing well or get worse.   This information is not intended to replace advice given to you by your health care provider. Make sure you discuss any questions you have with your health care provider.   Document Released: 04/13/2008 Document Revised: 11/16/2014 Document Reviewed: 08/07/2013 Elsevier Interactive Patient Education 2016 Elsevier Inc.  High-Fiber Diet Fiber, also called dietary fiber, is a type of carbohydrate found in fruits, vegetables, whole grains, and beans. A high-fiber diet can have many health benefits. Your health care provider may recommend a  high-fiber diet to help:  Prevent constipation. Fiber can make your bowel movements more regular.  Lower your cholesterol.  Relieve hemorrhoids, uncomplicated diverticulosis, or irritable bowel syndrome.  Prevent overeating as part of a weight-loss plan.  Prevent heart disease, type 2 diabetes, and certain cancers. WHAT IS MY PLAN? The recommended daily intake of fiber includes:  38 grams for men under age 39.  72 grams for men over age 41.  58 grams for women under age 67.  72 grams for women over age 76. You can get the recommended daily intake of dietary fiber by eating a variety of fruits, vegetables, grains, and beans. Your health care provider may also recommend a fiber supplement if it is not possible to get enough fiber through your diet. WHAT DO I NEED TO KNOW ABOUT A HIGH-FIBER DIET?  Fiber supplements have not been widely studied for their effectiveness, so it is better to get fiber through food sources.  Always check the fiber content on thenutrition facts label of any prepackaged food. Look for foods that contain at least 5 grams of fiber per serving.  Ask your dietitian if you have questions about specific foods that are related to your condition, especially if those foods are not listed in the following section.  Increase your daily fiber consumption gradually. Increasing your intake of dietary fiber too quickly may cause bloating, cramping, or gas.  Drink plenty of water. Water helps you to digest fiber. WHAT  FOODS CAN I EAT? Grains Whole-grain breads. Multigrain cereal. Oats and oatmeal. Brown rice. Barley. Bulgur wheat. [place]. Bran muffins. Popcorn. Rye wafer crackers. Vegetables Sweet potatoes. Spinach. Kale. Artichokes. Cabbage. Broccoli. Green peas. Carrots. Squash. Fruits Berries. Pears. Apples. Oranges. Avocados. Prunes and raisins. Dried figs. Meats and Other Protein Sources [place], kidney, pinto, and soy beans. Split peas. Lentils. Nuts and  seeds. Dairy Fiber-fortified yogurt. Beverages Fiber-fortified soy milk. Fiber-fortified orange juice. Other Fiber bars. The items listed above may not be a complete list of recommended foods or beverages. Contact your dietitian for more options. WHAT FOODS ARE NOT RECOMMENDED? Grains White bread. Pasta made with refined flour. White rice. Vegetables Fried potatoes. Canned vegetables. Well-cooked vegetables.  Fruits Fruit juice. Cooked, strained fruit. Meats and Other Protein Sources Fatty cuts of meat. Fried Sales executive or fried fish. Dairy Milk. Yogurt. Cream cheese. Sour cream. Beverages Soft drinks. Other Cakes and pastries. Butter and oils. The items listed above may not be a complete list of foods and beverages to avoid. Contact your dietitian for more information. WHAT ARE SOME TIPS FOR INCLUDING HIGH-FIBER FOODS IN MY DIET?  Eat a wide variety of high-fiber foods.  Make sure that half of all grains consumed each day are whole grains.  Replace breads and cereals made from refined flour or white flour with whole-grain breads and cereals.  Replace white rice with brown rice, bulgur wheat, or millet.  Start the day with a breakfast that is high in fiber, such as a cereal that contains at least 5 grams of fiber per serving.  Use beans in place of meat in soups, salads, or pasta.  Eat high-fiber snacks, such as berries, raw vegetables, nuts, or popcorn.   This information is not intended to replace advice given to you by your health care provider. Make sure you discuss any questions you have with your health care provider.   Document Released: 10/26/2005 Document Revised: 11/16/2014 Document Reviewed: 04/10/2014 Elsevier Interactive Patient Education [employer].

## 2016-02-14 NOTE — Discharge Instructions (Signed)
Please read and follow all provided instructions.  Your diagnoses today include:  1. Generalized abdominal pain    Tests performed today include:  Vital signs. See below for your results today.   Medications prescribed:   Golytely - laxative  Take any prescribed medications only as directed.  Home care instructions:   Follow any educational materials contained in this packet.  Follow-up instructions: Please follow-up with your primary care provider in the next 7 days for further evaluation of your symptoms.    Return instructions:  SEEK IMMEDIATE MEDICAL ATTENTION IF:  The pain does not go away or becomes severe   A temperature above 101F develops   Repeated vomiting occurs (multiple episodes)   The pain becomes localized to portions of the abdomen. The right side could possibly be appendicitis. In an adult, the left lower portion of the abdomen could be colitis or diverticulitis.   Blood is being passed in stools or vomit (bright red or black tarry stools)   You develop chest pain, difficulty breathing, dizziness or fainting, or become confused, poorly responsive, or inconsolable (young children)  If you have any other emergent concerns regarding your health  Additional Information: Abdominal (belly) pain can be caused by many things. Your caregiver performed an examination and possibly ordered blood/urine tests and imaging (CT scan, x-rays, ultrasound). Many cases can be observed and treated at home after initial evaluation in the emergency department. Even though you are being discharged home, abdominal pain can be unpredictable. Therefore, you need a repeated exam if your pain does not resolve, returns, or worsens. Most patients with abdominal pain don't have to be admitted to the hospital or have surgery, but serious problems like appendicitis and gallbladder attacks can start out as nonspecific pain. Many abdominal conditions cannot be diagnosed in one visit, so  follow-up evaluations are very important.  Your vital signs today were: BP 118/81 mmHg   Pulse 78   Temp(Src) 98 F (36.7 C) (Oral)   Resp 16   SpO2 98%   LMP 01/05/2016 (Approximate) If your blood pressure (bp) was elevated above 135/85 this visit, please have this repeated by your doctor within one month. --------------

## 2016-02-14 NOTE — ED Provider Notes (Signed)
CSN: [id number]     Arrival date & time 02/14/16  1232 History   First MD Initiated Contact with Patient 02/14/16 1421     Chief Complaint  Patient presents with  . Abdominal Pain     (Consider location/radiation/quality/duration/timing/severity/associated sxs/prior Treatment) HPI Comments: Patient with history of schizophrenia, PCOS, IBS -- presents with continued abdominal pain. Patient was seen earlier today with generalized abdominal pain. She has a history of constipation. Patient has noted some blood on tissue paper with straining and has felt hemorrhoids past. Patient was seen earlier this morning at 4 AM. Patient had an x-ray demonstrating a moderate stool burden. Her symptoms are similar to previous when she had constipation. UA was negative this AM. Patient was discharged home with increase in hydration, MiraLAX, increasing fiber. Patient had worsening pain this morning which prompted return to the emergency department. She denies fever or vomiting. No urinary symptoms.  Patient is a 19 y.o. female presenting with abdominal pain. The history is provided by the patient and medical records.  Abdominal Pain Associated symptoms: no chest pain, no cough, no diarrhea, no dysuria, no fever, no nausea, no sore throat and no vomiting     Past Medical History  Diagnosis Date  . Migraines   . Asthma     prn inhaler  . Schizophrenia ([place])   . History of seizures as a child     mother states were triggered by migraines; no seizures in > 7 yr.  . Difficulty swallowing pills   . 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 Constipation 05/05/2013  . 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 Galactorrhea 12/27/2014  . 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   Past Surgical History  Procedure Laterality Date  . Umbilical hernia repair    . Mri      under anesthesia  . Colonoscopy    . 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 Asthma Mother   . Diabetes Maternal Grandfather   . Hypertension Maternal Grandfather   . Heart disease Maternal Grandfather   . Kidney disease Maternal Grandfather   . Anesthesia problems Maternal Grandfather     hx. of being hard to wake up post-op  . Hypertension Maternal Uncle   . Hypertension Maternal Grandmother   . Stroke Maternal Grandmother    Social History  Substance Use Topics  . Smoking status: Never Smoker   . Smokeless tobacco: Never Used  . Alcohol Use: No   OB History    No data available     Review of Systems  Constitutional: Negative for fever.  HENT: Negative for rhinorrhea and sore throat.   Eyes: Negative for redness.  Respiratory: Negative for cough.   Cardiovascular: Negative for chest pain.  Gastrointestinal: Positive for abdominal pain. Negative for nausea, vomiting and diarrhea.  Genitourinary: Negative for dysuria.  Musculoskeletal: Negative for myalgias.  Skin: Negative for rash.  Neurological: Negative for headaches.      Allergies  Lactose intolerance (gi)  Home Medications   Prior to Admission medications   Medication Sig Start Date End Date Taking? Authorizing Provider  acetaminophen (TYLENOL) 500 MG tablet Take 1,000 mg by mouth every 6 (six) hours as needed for headache.  Yes Historical Provider, MD  albuterol (PROAIR HFA) 108 (90 BASE) MCG/ACT inhaler Inhale 2 puffs into the lungs every 4 (four) hours as needed for wheezing or shortness of breath. 07/08/15  Yes Dominic Pea, MD  ARIPiprazole 400 MG SUSR Inject 400 mg into the muscle every 30 (thirty) days. 01/03/16  Yes Leonides Grills, MD  ibuprofen (ADVIL,MOTRIN) 200 MG tablet Take 400 mg by mouth every 6 (six) hours as needed for moderate pain.   Yes Historical Provider, MD  Melatonin 5 MG TABS Take  1 tablet by mouth at bedtime.   Yes Historical Provider, MD  [place] 28 0.25-35 MG-MCG tablet Take 1 tablet by mouth daily.  12/14/15  Yes Historical Provider, MD  BENZACLIN gel Apply topically every morning. Patient not taking: Reported on 02/14/2016 0000000   Dierdre Harness, NP  benztropine (COGENTIN) 1 MG tablet Take 1 tablet (1 mg total) by mouth daily. Patient not taking: Reported on 02/05/2016 01/03/16 01/02/17  Leonides Grills, MD  cetirizine (ZYRTEC) 10 MG tablet Take 1 tablet (10 mg total) by mouth daily. For allergy symptoms Patient not taking: Reported on 02/14/2016 07/08/15   Dominic Pea, MD  metroNIDAZOLE (FLAGYL) 500 MG tablet Take 1 tablet (500 mg total) by mouth 2 (two) times daily. Patient not taking: Reported on 02/14/2016 02/06/16   Trude Mcburney, FNP  metroNIDAZOLE (METROGEL VAGINAL) 0.75 % vaginal gel Place 1 applicatorful vaginally at bedtime twice a week for 6 months Patient not taking: Reported on 02/13/2016 02/06/16   Trude Mcburney, FNP  Naproxen Sodium 220 MG CAPS Take 1 capsule (220 mg total) by mouth 2 (two) times daily. Patient not taking: Reported on 02/03/2016 01/24/16   Rosemarie Ax, MD  polyethylene glycol powder [facility]) powder Take 1 capful once daily Patient not taking: Reported on 02/14/2016 01/24/16   Rosemarie Ax, MD  Vitamin D, Ergocalciferol, (DRISDOL) 50000 UNITS CAPS capsule Take 1 capsule (50,000 Units total) by mouth every 7 (seven) days. Patient not taking: Reported on 02/14/2016 12/19/14   Trude Mcburney, FNP   BP 118/81 mmHg  Pulse 78  Temp(Src) 98 F (36.7 C) (Oral)  Resp 16  SpO2 98%  LMP 01/05/2016 (Approximate) Physical Exam  Constitutional: She appears well-developed and well-nourished.  HENT:  Head: Normocephalic and atraumatic.  Eyes: Conjunctivae are normal. Right eye exhibits no discharge. Left eye exhibits no discharge.  Neck: Normal range of motion. Neck supple.  Cardiovascular: Normal rate, regular rhythm and  normal heart sounds.   Pulmonary/Chest: Effort normal and breath sounds normal.  Abdominal: Soft. There is no tenderness.  Neurological: She is alert.  Skin: Skin is warm and dry.  Psychiatric: She has a normal mood and affect.  Nursing note and vitals reviewed.   ED Course  Procedures (including critical care time) Labs Review Labs Reviewed - No data to display  Imaging Review Dg Abd 1 View  02/14/2016  CLINICAL DATA:  RIGHT lower pelvic pain for 1 year, worsening now. History of constipation and umbilical herniorrhaphy. EXAM: ABDOMEN - 1 VIEW COMPARISON:  None. FINDINGS: The bowel gas pattern is normal. Moderate amount of retained large bowel stool. Mid abdominal suture material may represent umbilical herniorrhaphy. No radio-opaque calculi or other significant radiographic abnormality are seen. IMPRESSION: Moderate amount of retained large bowel stool, normal bowel gas pattern. Electronically Signed   By: Elon Alas M.D.   On: 02/14/2016 05:29   I have personally reviewed and evaluated these images and lab results as  part of my medical decision-making.   EKG Interpretation None      3:13 PM Patient seen and examined. I agree with workup performed earlier today. I do not feel that evaluation with lab testing or advanced imaging is necessary at this time.  Vital signs reviewed and are as follows: BP 118/81 mmHg  Pulse 78  Temp(Src) 98 F (36.7 C) (Oral)  Resp 16  SpO2 98%  LMP 01/05/2016 (Approximate)  Offered enema versus suppository. Patient refused these after explaining what these were. She has had a colonoscopy and bowel prep in the past. Will prescribe GoLYTELY for patient to take at home. She is in agreement with this plan.  The patient was urged to return to the Emergency Department immediately with worsening of current symptoms, worsening abdominal pain, persistent vomiting, larger amount of blood noted in stools, fever, or any other concerns. The patient  verbalized understanding.    MDM   Final diagnoses:  Generalized abdominal pain   Patient with abdominal pain, History of constipation, x-ray demonstrating constipation, symptoms similar to previous consultation symptoms. On exam here, abdomen is soft and nontender. Vitals are stable, no fever. No signs of dehydration, patient is tolerating PO's. Lungs are clear and no signs suggestive of PNA. Low concern for appendicitis, cholecystitis, pancreatitis, ruptured viscus, UTI, kidney stone, aortic dissection, aortic aneurysm or other emergent abdominal etiology. Supportive therapy indicated with return if symptoms worsen.     Carlisle Cater, PA-C 02/14/16 [place], MD 02/14/16 [phone]

## 2016-02-14 NOTE — ED Provider Notes (Signed)
CSN: [id number]     Arrival date & time 02/14/16  0455 History   First MD Initiated Contact with Patient 02/14/16 [phone]     Chief Complaint  Patient presents with  . Abdominal Pain     (Consider location/radiation/quality/duration/timing/severity/associated sxs/prior Treatment) HPI Comments: This is a 20 year old female with history of chronic right lower quadrant pain, constipation.  PCO S denies sexual activity has been evaluated this emergency department approximately one month ago for same.  She states she is followed up with her primary care physician.  States she is having regular bowel movements.  She was diagnosed approximately one week ago with BV and she's currently taking Flagyl.  She denies any nausea, vomiting, trauma.  She states she does exercise on a regular basis.  She does do crunches which she's been doing for greater than 3 months.  This does cause increasing discomfort in her abdomen  Patient is a 20 y.o. female presenting with abdominal pain. The history is provided by the patient.  Abdominal Pain Pain location:  LLQ Pain quality: aching   Pain radiates to:  Does not radiate Pain severity:  Moderate Onset quality:  Gradual Timing:  Constant Progression:  Waxing and waning Chronicity:  Recurrent Relieved by:  Nothing Worsened by:  Position changes and palpation Ineffective treatments:  None tried Associated symptoms: constipation and dysuria   Associated symptoms: no cough, no fever, no nausea and no vomiting     Past Medical History  Diagnosis Date  . Migraines   . Asthma     prn inhaler  . Schizophrenia ([place])   . History of seizures as a child     mother states were triggered by migraines; no seizures in > 7 yr.  . Difficulty swallowing pills   . 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 Constipation 05/05/2013  . 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 Galactorrhea 12/27/2014  . 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   Past  Surgical History  Procedure Laterality Date  . Umbilical hernia repair    . Mri      under anesthesia  . Colonoscopy    . 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 Asthma Mother   . Diabetes Maternal Grandfather   . Hypertension Maternal Grandfather   . Heart disease Maternal Grandfather   . Kidney disease Maternal Grandfather   . Anesthesia problems Maternal Grandfather     hx. of being hard to wake up post-op  . Hypertension Maternal Uncle   . Hypertension Maternal Grandmother   . Stroke Maternal Grandmother    Social History  Substance Use Topics  . Smoking status: Never Smoker   . Smokeless tobacco: Never Used  . Alcohol Use: No   OB History    No data available     Review of Systems  Constitutional: Negative for fever.  Respiratory: Negative for cough.   Gastrointestinal: Positive for abdominal pain and constipation. Negative for nausea and vomiting.  Genitourinary: Positive for dysuria.  Skin: Negative for wound.  All other systems reviewed and are negative.     Allergies  Lactose intolerance (gi)  Home Medications   Prior to Admission medications   Medication Sig Start Date End Date Taking? Authorizing  Provider  acetaminophen (TYLENOL) 500 MG tablet Take 1,000 mg by mouth every 6 (six) hours as needed for headache.   Yes Historical Provider, MD  albuterol (PROAIR HFA) 108 (90 BASE) MCG/ACT inhaler Inhale 2 puffs into the lungs every 4 (four) hours as needed for wheezing or shortness of breath. 07/08/15  Yes Dominic Pea, MD  ARIPiprazole 400 MG SUSR Inject 400 mg into the muscle every 30 (thirty) days. 01/03/16  Yes Leonides Grills, MD  ibuprofen (ADVIL,MOTRIN) 200 MG tablet Take 400 mg by mouth every 6 (six) hours as needed for moderate  pain.   Yes Historical Provider, MD  Melatonin 5 MG TABS Take 1 tablet by mouth at bedtime.   Yes Historical Provider, MD  [place] 28 0.25-35 MG-MCG tablet Take 1 tablet by mouth daily.  12/14/15  Yes Historical Provider, MD  BENZACLIN gel Apply topically every morning. Patient not taking: Reported on 02/14/2016 0000000   Dierdre Harness, NP  benztropine (COGENTIN) 1 MG tablet Take 1 tablet (1 mg total) by mouth daily. Patient not taking: Reported on 02/05/2016 01/03/16 01/02/17  Leonides Grills, MD  cetirizine (ZYRTEC) 10 MG tablet Take 1 tablet (10 mg total) by mouth daily. For allergy symptoms Patient not taking: Reported on 02/14/2016 07/08/15   Dominic Pea, MD  metroNIDAZOLE (FLAGYL) 500 MG tablet Take 1 tablet (500 mg total) by mouth 2 (two) times daily. Patient not taking: Reported on 02/14/2016 02/06/16   Trude Mcburney, FNP  metroNIDAZOLE (METROGEL VAGINAL) 0.75 % vaginal gel Place 1 applicatorful vaginally at bedtime twice a week for 6 months Patient not taking: Reported on 02/13/2016 02/06/16   Trude Mcburney, FNP  Naproxen Sodium 220 MG CAPS Take 1 capsule (220 mg total) by mouth 2 (two) times daily. Patient not taking: Reported on 02/03/2016 01/24/16   Rosemarie Ax, MD  polyethylene glycol powder [employer]) powder Take 1 capful once daily Patient not taking: Reported on 02/14/2016 01/24/16   Rosemarie Ax, MD  Vitamin D, Ergocalciferol, (DRISDOL) 50000 UNITS CAPS capsule Take 1 capsule (50,000 Units total) by mouth every 7 (seven) days. Patient not taking: Reported on 02/14/2016 12/19/14   Trude Mcburney, FNP   BP 115/73 mmHg  Pulse 64  Temp(Src) 98.5 F (36.9 C) (Oral)  Resp 16  SpO2 100%  LMP 01/05/2016 (Approximate) Physical Exam  Constitutional: She is oriented to person, place, and time. She appears well-developed and well-nourished.  HENT:  Head: Normocephalic.  Eyes: Pupils are equal, round, and reactive to light.  Neck: Normal range of motion.   Cardiovascular: Normal rate and regular rhythm.   Pulmonary/Chest: Effort normal and breath sounds normal.  Abdominal: Soft. She exhibits no distension. There is tenderness in the right lower quadrant. There is no rigidity, no rebound, no guarding, no tenderness at McBurney's point and negative Murphy's sign. No hernia. Hernia confirmed negative in the right inguinal area and confirmed negative in the left inguinal area.  Musculoskeletal: Normal range of motion.  Neurological: She is alert and oriented to person, place, and time.  Skin: Skin is warm.  Nursing note and vitals reviewed.   ED Course  Procedures (including critical care time) Labs Review Labs Reviewed  URINALYSIS, ROUTINE W REFLEX MICROSCOPIC (NOT AT [facility]) - Abnormal; Notable for the following:    Leukocytes, UA SMALL (*)    All other components within normal limits  URINE MICROSCOPIC-ADD ON - Abnormal; Notable for the following:    Squamous Epithelial / LPF 0-5 (*)  Bacteria, UA FEW (*)    All other components within normal limits  POC URINE PREG, ED    Imaging Review Dg Abd 1 View  02/14/2016  CLINICAL DATA:  RIGHT lower pelvic pain for 1 year, worsening now. History of constipation and umbilical herniorrhaphy. EXAM: ABDOMEN - 1 VIEW COMPARISON:  None. FINDINGS: The bowel gas pattern is normal. Moderate amount of retained large bowel stool. Mid abdominal suture material may represent umbilical herniorrhaphy. No radio-opaque calculi or other significant radiographic abnormality are seen. IMPRESSION: Moderate amount of retained large bowel stool, normal bowel gas pattern. Electronically Signed   By: Elon Alas M.D.   On: 02/14/2016 05:29   I have personally reviewed and evaluated these images and lab results as part of my medical decision-making.   EKG Interpretation None    Counseled to add more water, use the Miralax and add more fiber to diet   MDM   Final diagnoses:  Slow transit constipation          Junius Creamer, NP 02/14/16 [place], NP 02/14/16 [place], MD 02/14/16 [phone]

## 2016-02-14 NOTE — ED Notes (Signed)
Per EMS pt c/o RLQ pain x15month off and on, pain x4hrs tonight; states pain gets better after having a BM

## 2016-02-14 NOTE — ED Notes (Signed)
Per EMS pt c/o low abdominal pain/pelvic pain, seen today at 0400, had small bowel movement since then. BS present all quadrants.

## 2016-02-16 ENCOUNTER — Emergency Department (HOSPITAL_COMMUNITY)
Admission: EM | Admit: 2016-02-16 | Discharge: 2016-02-16 | Disposition: A | Discharge status: Home / Self Care | Payer: Medicaid Other | Attending: Emergency Medicine | Admitting: Emergency Medicine

## 2016-02-16 ENCOUNTER — Encounter (HOSPITAL_COMMUNITY): Payer: Self-pay

## 2016-02-16 DIAGNOSIS — Z79899 Other long term (current) drug therapy: Secondary | ICD-10-CM | POA: Insufficient documentation

## 2016-02-16 DIAGNOSIS — J45909 Unspecified asthma, uncomplicated: Secondary | ICD-10-CM | POA: Insufficient documentation

## 2016-02-16 DIAGNOSIS — Z8679 Personal history of other diseases of the circulatory system: Secondary | ICD-10-CM | POA: Diagnosis not present

## 2016-02-16 DIAGNOSIS — R195 Other fecal abnormalities: Secondary | ICD-10-CM | POA: Insufficient documentation

## 2016-02-16 DIAGNOSIS — K5900 Constipation, unspecified: Secondary | ICD-10-CM | POA: Insufficient documentation

## 2016-02-16 DIAGNOSIS — R197 Diarrhea, unspecified: Secondary | ICD-10-CM | POA: Diagnosis present

## 2016-02-16 DIAGNOSIS — F209 Schizophrenia, unspecified: Secondary | ICD-10-CM | POA: Diagnosis not present

## 2016-02-16 DIAGNOSIS — Z8639 Personal history of other endocrine, nutritional and metabolic disease: Secondary | ICD-10-CM | POA: Insufficient documentation

## 2016-02-16 MED ORDER — BISMUTH SUBSALICYLATE 262 MG/15ML PO SUSP: 30.0000 mL | Freq: Four times a day (QID) | ORAL | Status: DC | PRN

## 2016-02-16 NOTE — Discharge Instructions (Signed)
Pepto-Bismol will turn your stools completely black and this is a normal side effect. If you take GoLYTELY you will have profuse stools. This is an intended consequence.

## 2016-02-16 NOTE — ED Notes (Signed)
PT DISCHARGED. INSTRUCTIONS AND PRESCRIPTION GIVEN. AAOX3. PT IN NO APPARENT DISTRESS OR PAIN. THE OPPORTUNITY TO ASK QUESTIONS WAS PROVIDED. 

## 2016-02-16 NOTE — ED Provider Notes (Signed)
CSN: [id number]     Arrival date & time 02/16/16  1336 History   First MD Initiated Contact with Patient 02/16/16 1347     Chief Complaint  Patient presents with  . Diarrhea    STATES DIARRHEA AFTER TAKING A LAXATIVE LAST NIGHT     (Consider location/radiation/quality/duration/timing/severity/associated sxs/prior Treatment) Patient is a 20 y.o. female presenting with diarrhea. The history is provided by the patient.  Diarrhea Quality:  Semi-solid Severity:  Moderate Onset quality:  Sudden Duration:  1 day Timing:  Constant Progression:  Unchanged Exacerbated by: golytely. Associated symptoms comment:  Cramping with stools   Past Medical History  Diagnosis Date  . Migraines   . Asthma     prn inhaler  . Schizophrenia ([place])   . History of seizures as a child     mother states were triggered by migraines; no seizures in > 7 yr.  . Difficulty swallowing pills   . 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 Constipation 05/05/2013  . 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 Galactorrhea 12/27/2014  . 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   Past Surgical History  Procedure Laterality Date  . Umbilical hernia repair    . Mri      under anesthesia  . Colonoscopy    . 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 Asthma Mother   . Diabetes Maternal Grandfather   . Hypertension Maternal Grandfather   . Heart disease Maternal Grandfather   . Kidney disease Maternal Grandfather   . Anesthesia problems Maternal Grandfather     hx. of being hard to wake up post-op  . Hypertension Maternal Uncle   . Hypertension Maternal Grandmother   . Stroke Maternal Grandmother    Social History  Substance Use Topics  . Smoking status: Never Smoker   .  Smokeless tobacco: Never Used  . Alcohol Use: No   OB History    No data available     Review of Systems  Gastrointestinal: Positive for diarrhea.  All other systems reviewed and are negative.     Allergies  Lactose intolerance (gi)  Home Medications   Prior to Admission medications   Medication Sig Start Date End Date Taking? Authorizing Provider  acetaminophen (TYLENOL) 500 MG tablet Take 1,000 mg by mouth every 6 (six) hours as needed for headache.    Historical Provider, MD  albuterol (PROAIR HFA) 108 (90 BASE) MCG/ACT inhaler Inhale 2 puffs into the lungs every 4 (four) hours as needed for wheezing or shortness of breath. 07/08/15   Dominic Pea, MD  ARIPiprazole 400 MG SUSR Inject 400 mg into the muscle every 30 (thirty) days. 01/03/16   Leonides Grills, MD  ibuprofen (ADVIL,MOTRIN) 200 MG tablet Take 400 mg by mouth every 6 (six) hours as needed for moderate pain.    Historical Provider, MD  Melatonin 5 MG TABS Take 1 tablet by mouth at bedtime.    Historical Provider, MD  polyethylene glycol (GOLYTELY) 236 g solution Take 4,000 mLs by mouth once. 02/14/16   Carlisle Cater, PA-C  SPRINTEC 28 0.25-35 MG-MCG tablet Take 1 tablet by mouth daily.  12/14/15   Historical Provider, MD   BP 111/72 mmHg  Pulse 72  Temp(Src) 98.7 F (37.1 C) (Oral)  Resp 16  Ht 5\' 2"  (1.575 m)  Wt 118 lb (53.524 kg)  BMI 21.58 kg/m2  SpO2 100%  LMP 01/05/2016 (Approximate) Physical Exam  Constitutional: She is oriented to person, place, and time. She appears well-developed and well-nourished. No distress.  HENT:  Head: Normocephalic.  Eyes: Conjunctivae are normal.  Neck: Neck supple. No tracheal deviation present.  Cardiovascular: Normal rate and regular rhythm.   Pulmonary/Chest: Effort normal. No respiratory distress.  Abdominal: Soft. She exhibits no distension. There is no tenderness. There is no rebound and no guarding.  Neurological: She is alert and oriented to person, place, and  time.  Skin: Skin is warm and dry.  Psychiatric: She has a normal mood and affect.    ED Course  Procedures (including critical care time) Labs Review Labs Reviewed - No data to display  Imaging Review No results found. I have personally reviewed and evaluated these images and lab results as part of my medical decision-making.   EKG Interpretation None      MDM   Final diagnoses:  Passage of loose stools    20 y.o. female presents with previous constipation, was given GoLytely, and began having loose stools with lower abdominal cramping. This is an intended effect of therapy, she appears otherwise well and I recommended she stop the golytely if she is unable to tolerate it but it appears her constipation has resolved. She can take pepto bismol to help regulate her current loose stools, she was instructed not to take immodium so she does not get constipated and told her stool will turn black, this is normal. Plan to follow up with PCP as needed and return precautions discussed for worsening or new concerning symptoms.     Leo Grosser, MD 02/16/16 [phone]

## 2016-02-16 NOTE — ED Notes (Signed)
PT RECEIVED VIA EMS C/O DIARRHEA AFTER TAKING A LAXATIVE LAST NIGHT. PT ALSO C/O RIGHT PAIN AND TINGLING TO THE RIGHT FOOT DURING THE DIARRHEA EPISODES. DENIES INJURY.

## 2016-02-16 NOTE — ED Notes (Signed)
Bed: [id number] Expected date:  Expected time:  Means of arrival:  Comments: EMS - 20 yo diarrhea

## 2016-02-17 ENCOUNTER — Ambulatory Visit (INDEPENDENT_AMBULATORY_CARE_PROVIDER_SITE_OTHER): Payer: Medicaid Other | Admitting: Pediatrics

## 2016-02-17 ENCOUNTER — Encounter: Payer: Self-pay | Admitting: Pediatrics

## 2016-02-17 VITALS — BP 110/80 | Temp 98.8°F | Wt 114.6 lb

## 2016-02-17 DIAGNOSIS — K5909 Other constipation: Secondary | ICD-10-CM

## 2016-02-17 MED ORDER — POLYETHYLENE GLYCOL 3350 17 GM/SCOOP PO POWD: 1.0000 | Freq: Once | ORAL | Status: DC

## 2016-02-17 NOTE — Progress Notes (Signed)
History was provided by the patient.  Natalie Wagner is a 20 y.o. female presents today for ED follow-up.  Was seen on the 7th for abdominal pain and was diagnosed with constipation and given Golytely .  Since then she has had watery stools and was concerned with the medications.  Voiding normally.  Didn't complete the Flagyl because the cramps.     Of note patient brought up vague symptoms about her throat feeling tight.  Mom stated it started after stopping one of her medications that help with tremors and they have an appointment to get that restarted. No shortness of breath, no difficulty breathing and was talking normally the whole time.     The following portions of the patient's history were reviewed and updated as appropriate: allergies, current medications, past family history, past medical history, past social history, past surgical history and problem list.  Review of Systems  Constitutional: Negative for fever and weight loss.  HENT: Negative for congestion, ear discharge, ear pain and sore throat.   Eyes: Negative for pain, discharge and redness.  Respiratory: Negative for cough and shortness of breath.   Cardiovascular: Negative for chest pain.  Gastrointestinal: Positive for abdominal pain, diarrhea and constipation. Negative for vomiting.  Genitourinary: Negative for frequency and hematuria.  Musculoskeletal: Negative for back pain, falls and neck pain.  Skin: Negative for rash.  Neurological: Negative for speech change, loss of consciousness and weakness.  Endo/Heme/Allergies: Does not bruise/bleed easily.  Psychiatric/Behavioral: The patient does not have insomnia.      Physical Exam:  BP 110/80 mmHg  Temp(Src) 98.8 F (37.1 C)  Wt 114 lb 9.6 oz (51.982 kg)  LMP 01/05/2016 (Approximate)  No height on file for this encounter. HR: 70  General:   alert, cooperative, appears stated age and no distress  Oral cavity:   lips, mucosa, and tongue normal; teeth and gums  normal  Eyes:   sclerae white  Ears:   normal bilaterally  Nose: clear, no discharge, no nasal flaring  Neck:  Neck appearance: Normal  Lungs:  clear to auscultation bilaterally  Heart:   regular rate and rhythm, S1, S2 normal, no murmur, click, rub or gallop   abd Soft, non-tender, non-distended normal BS. No masses no organomegaly   Neuro:  normal without focal findings     Assessment/Plan: 1. Other constipation Told Natalie Wagner that because of all of the medication she is on she will need to be on a little Miralax every day.  Discussed the side effects of Miralax and what to expect. She expressed understanding. Told them to see if they can call the psychiatrist to get that medication sooner.  - polyethylene glycol powder (GLYCOLAX/MIRALAX) powder; Take 255 g by mouth once. Take 1 capful once a day to have a soft stool daily. Can increase or decrease as needed  Dispense: 255 g; Refill: 11     Cherece Mcneil Sober, MD  02/17/2016

## 2016-02-18 ENCOUNTER — Telehealth (HOSPITAL_COMMUNITY): Payer: Self-pay

## 2016-02-18 NOTE — Telephone Encounter (Signed)
Patient is due for Abilify Injection tomorrow, her mother called today and stated that patient no longer wants to be on the shot and would like to go back on they zyprexa. She has a follow up with you on 5/18 - please review and advise, thank you

## 2016-02-18 NOTE — Telephone Encounter (Signed)
We will need to discuss it at her scheduled appt. I am not going to switch without evaluating the pt in the clinic.

## 2016-02-18 NOTE — Telephone Encounter (Signed)
I called the patients mother Natalie Wagner and let her know that patient would have tome in and see Dr [name]. The mother is not sure if patient is going to come in for the shot - she will call me back and let me know after speaking with patient

## 2016-02-19 ENCOUNTER — Ambulatory Visit (HOSPITAL_COMMUNITY): Payer: Self-pay

## 2016-02-24 ENCOUNTER — Other Ambulatory Visit: Payer: Self-pay | Admitting: Pediatrics

## 2016-02-24 ENCOUNTER — Telehealth: Payer: Self-pay | Admitting: Pediatrics

## 2016-02-24 NOTE — Telephone Encounter (Signed)
[facility] has a [place] location so if they ask them to make their next appointment in [place] than we will not have to do another referral.

## 2016-02-24 NOTE — Telephone Encounter (Signed)
TC with [place], and informed her that Mattalyn should be able to schedule her next appointment at the [facility] location. Also told [place], if the family has any trouble scheduling at the [facility] location to give [place] a call.

## 2016-02-24 NOTE — Telephone Encounter (Signed)
TC with Judy Pimple, [facility] care manager for Natalie Wagner, who stated that this patient family would like to be referred to a GI doctor in [place]. Natalie Wagner has been receiving GI services from [facility] for a while, however, the family would like to move her care to [place]. Routing message to PCP. Caryl Comes can be reached at [phone] with any questions.

## 2016-02-26 ENCOUNTER — Ambulatory Visit: Payer: Medicaid Other

## 2016-02-26 ENCOUNTER — Encounter: Payer: Self-pay | Admitting: Pediatrics

## 2016-02-26 ENCOUNTER — Ambulatory Visit (INDEPENDENT_AMBULATORY_CARE_PROVIDER_SITE_OTHER): Payer: Medicaid Other | Admitting: Pediatrics

## 2016-02-26 VITALS — BP 104/60 | Temp 98.3°F | Wt 118.0 lb

## 2016-02-26 DIAGNOSIS — N760 Acute vaginitis: Secondary | ICD-10-CM | POA: Diagnosis not present

## 2016-02-26 DIAGNOSIS — N906 Unspecified hypertrophy of vulva: Secondary | ICD-10-CM | POA: Diagnosis not present

## 2016-02-26 DIAGNOSIS — F201 Disorganized schizophrenia: Secondary | ICD-10-CM

## 2016-02-26 LAB — POCT URINE PREGNANCY: Preg Test, Ur: NEGATIVE

## 2016-02-26 MED ORDER — METRONIDAZOLE 500 MG PO TABS: 500.0000 mg | ORAL_TABLET | Freq: Two times a day (BID) | ORAL | Status: AC

## 2016-02-26 NOTE — Progress Notes (Signed)
Subjective:    Natalie Wagner is a 20 y.o. old female here with her mother for Vaginal Discharge and OTHER .    HPI   This 20 year old is here for persistent vaginal discharge. She has chronic BV, most likely due to noncompliance. Recently, she has been seen multiple times and treatment has been oral flagyl followed by flagyl gel two times per week for 4-6 months.  She did not complete the prescribed flagyl orally because the taste is bad and it has caused stomach aches before and she is afraid it will worsen her vaginal swelling. SHe did not use the flagyl gel because she did not take the flagyl pills and was told to start at the completion of the flagyl pills. The discharge is clear to white. It has an odor-described as fishy. SHe denies pain in the stomach currently or fever. Today she is most worried about the swelling of her vagina.   Last menses 2/26 or 3/26-patient and mother are in disagreement. She denies sexually active. She does not douche or use any chemicals in her vagina.  She has had a surgery done in the past for labial reduction 02/2015. She has scheduled follow up with [facility] next week. She has quite a bit of anxiety around her vaginal swelling.  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  Immunizations needed: none     Objective:    BP 104/60 mmHg  Temp(Src) 98.3 F (36.8 C) (Oral)  Wt 118 lb (53.524 kg)  LMP 01/05/2016 (Approximate) Physical Exam  Constitutional:  Anxious teen with pressured speech and great concern about vaginal swelling  Cardiovascular: Normal rate and regular rhythm.   Pulmonary/Chest: Effort normal and breath sounds normal.  Abdominal: Soft. Bowel sounds are normal.  Genitourinary:  Labia majora with redundant skin. Normal vaginal tissue. No current discharge. Vaginal sample taken by probe.  Skin: No rash noted.       Assessment and Plan:   Natalie Wagner is a 20 y.o. old female with chronic BV and anxiety over vaginal swelling.  1. Vaginitis This chronic BV is likely due to noncompliance. Lengthy discussion today regarding need for treatment and reassuring patient that treatment will not cause vaginal swelling - WET PREP BY MOLECULAR PROBE - GC/Chlamydia Probe Amp - POCT urine pregnancy - metroNIDAZOLE (FLAGYL) 500 MG tablet; Take 1 tablet (500 mg total) by mouth 2 (two) times daily.  Dispense: 14 tablet; Refill: 0 -has follow up with [facility] next month-recommend finding local Gyn for long term care.  2. Hypertrophy, vulva Has appointment with Gyn net week to review  3. Schizophrenia, disorganized type [facility])      Return for should have follow up with Jonathon Resides at the end of May.  Lucy [place], MD

## 2016-02-27 LAB — WET PREP BY MOLECULAR PROBE
Candida species: NEGATIVE
Gardnerella vaginalis: POSITIVE — AB
TRICHOMONAS VAG: NEGATIVE

## 2016-02-27 LAB — GC/CHLAMYDIA PROBE AMP
CT PROBE, AMP APTIMA: NOT DETECTED
GC Probe RNA: NOT DETECTED

## 2016-03-03 ENCOUNTER — Encounter (HOSPITAL_COMMUNITY): Payer: Self-pay | Admitting: Psychiatry

## 2016-03-03 ENCOUNTER — Ambulatory Visit (INDEPENDENT_AMBULATORY_CARE_PROVIDER_SITE_OTHER): Payer: Medicaid Other | Admitting: Psychiatry

## 2016-03-03 VITALS — BP 106/64 | HR 68 | Ht 58.25 in | Wt 113.6 lb

## 2016-03-03 DIAGNOSIS — F201 Disorganized schizophrenia: Secondary | ICD-10-CM

## 2016-03-03 DIAGNOSIS — G4700 Insomnia, unspecified: Secondary | ICD-10-CM

## 2016-03-03 MED ORDER — BENZTROPINE MESYLATE 1 MG PO TABS: 1.0000 mg | ORAL_TABLET | Freq: Two times a day (BID) | ORAL | Status: DC

## 2016-03-03 MED ORDER — DIPHENHYDRAMINE HCL 12.5 MG/5ML PO LIQD: 25.0000 mg | Freq: Every evening | ORAL | Status: DC | PRN

## 2016-03-03 MED ORDER — ARIPIPRAZOLE ER 400 MG IM SUSR: 400.0000 mg | INTRAMUSCULAR | Status: DC

## 2016-03-03 NOTE — Progress Notes (Signed)
Quick Note:  [employer] and told her lab results, she said that she still has a few days left of the antibiotics and its swelling and needs to use ice to keep the swelling down.  ______

## 2016-03-03 NOTE — Progress Notes (Signed)
[facility]  Progress Note  Natalie Wagner [id number] 20 y.o.  03/03/2016 10:14 AM  Chief Complaint: I am concerned about my meds.    History of Present Illness:  Patient seen along with her mother patient gave permission for the mother to be in the room. Patient seen today for medication follow-up.  Pt missed her Abilify injection that was due a few weeks ago. States it causes insomnia and  anxiety. She doesn't like it.   States Cogentin helps but she is not consistently taking it.   Pt has gone to the ED twice in 2 days for abdominal pain. Pt believes the Abilify is causing slow GI issues.   Pt states she is having panic attacks almost daily. Pt uses relaxation techniques that helps sometimes.  It happens in dark places so she usually keeps her bedroom lights on.   Pt is not sleeping well. She is having broken sleep and sometimes get up in the middle of the night to wash clothes. Her mind races and pt is feeling tired. Pt reports no change in sleep parents since missing the Abilify shot.   Pt denies depression. She is bored and irritable.   Denies AVH, ideas of reference and paranoia.    Pt is exercising about twice a month. Pt has lost weight feels very good about it. Pt is worried because her appetite seems increased but mom and pt both report pt has not put on weight and is eating healthy.   Mom states she has poor insight and is paranoid. Mom states when pt is alone she is talking to herself and laughing. Pt states she is watching tv.  Taking meds as prescribed and denies SE.   Suicidal Ideation: No Plan Formed: No Patient has means to carry out plan: No  Homicidal Ideation: No Plan Formed: No Patient has means to carry out plan: No  Review of Systems  Constitutional: Negative for fever, weight loss and malaise/fatigue.  HENT: Negative for congestion, ear discharge and sore throat.   Eyes: Negative for blurred vision, photophobia, discharge and redness.        Wears glasses  Respiratory: Negative for cough and wheezing.   Cardiovascular: Negative for chest pain, palpitations and orthopnea.  Gastrointestinal: Positive for abdominal pain and constipation. Negative for heartburn, nausea, vomiting and diarrhea.       Lactose intolerance  Genitourinary: Negative for dysuria and urgency.  Musculoskeletal: Positive for back pain. Negative for myalgias, joint pain, falls and neck pain.  Skin: Negative for itching and rash.  Neurological: Negative for dizziness, tingling, sensory change, seizures, loss of consciousness, weakness and headaches.  Endo/Heme/Allergies: Negative for environmental allergies.  Psychiatric/Behavioral: Negative for depression, suicidal ideas, hallucinations, memory loss and substance abuse. The patient is nervous/anxious and has insomnia.     Past Medical Family, Social History: Patient is staying at home Family History  Problem Relation Age of Onset  . Asthma Mother   . Diabetes Maternal Grandfather   . Hypertension Maternal Grandfather   . Heart disease Maternal Grandfather   . Kidney disease Maternal Grandfather   . Anesthesia problems Maternal Grandfather     hx. of being hard to wake up post-op  . Hypertension Maternal Uncle   . Hypertension Maternal Grandmother   . Stroke Maternal Grandmother    Past Medical History  Diagnosis Date  . Migraines   . Asthma     prn inhaler  . Schizophrenia ([place])   . History of seizures as a  child     mother states were triggered by migraines; no seizures in > 7 yr.  . Difficulty swallowing pills   . 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 Constipation 05/05/2013  . Elevated prolactin level ([place]) 12/27/2014  . Galactorrhea 12/27/2014  . 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    Outpatient Encounter Prescriptions as of 03/03/2016  Medication Sig  . acetaminophen (TYLENOL) 500 MG tablet Take 1,000 mg by mouth every 6 (six) hours as needed for  headache. Reported on 02/17/2016  . albuterol (PROAIR HFA) 108 (90 BASE) MCG/ACT inhaler Inhale 2 puffs into the lungs every 4 (four) hours as needed for wheezing or shortness of breath.  Marland Kitchen ibuprofen (ADVIL,MOTRIN) 200 MG tablet Take 400 mg by mouth every 6 (six) hours as needed for moderate pain. Reported on 02/17/2016  . Melatonin 5 MG TABS Take 1 tablet by mouth at bedtime.  . metroNIDAZOLE (FLAGYL) 500 MG tablet Take 1 tablet (500 mg total) by mouth 2 (two) times daily.  . polyethylene glycol powder (GLYCOLAX/MIRALAX) powder Take 255 g by mouth once. Take 1 capful once a day to have a soft stool daily. Can increase or decrease as needed  . SPRINTEC 28 0.25-35 MG-MCG tablet Take 1 tablet by mouth daily. Reported on 02/17/2016  . ARIPiprazole 400 MG SUSR Inject 400 mg into the muscle every 30 (thirty) days. (Patient not taking: Reported on 02/26/2016)   No facility-administered encounter medications on file as of 03/03/2016.    Past Psychiatric History/Hospitalization(s): Anxiety: No Bipolar Disorder: No Depression: No Mania: No Psychosis: Yes Schizophrenia: Yes Personality Disorder: No Hospitalization for psychiatric illness: Yes History of Electroconvulsive Shock Therapy: No Prior Suicide Attempts: Yes  Physical Exam:AIMS score is 0 Constitutional: Blood pressure 106/64, pulse 68, height 4' 10.25" (1.48 m), weight 113 lb 9.6 oz (51.529 kg), last menstrual period 01/05/2016. General Appearance: alert, oriented, no acute distress and obese  Musculoskeletal: Strength & Muscle Tone: within normal limits Gait & Station: normal Patient leans: straight  Psychiatric: Speech (describe rate, volume, coherence, spontaneity, and abnormalities if any): normal in volume, rate, tone ,spontaneous but minimal in content  Thought Process (describe rate, content, abstract reasoning, and computation): Mild disorganized, concrete  Thoughts content patient appears to be ruminating quite a bit and  obsessing about her weight gain and medication SE  Mental Status: Orientation: oriented to person, place, situation, day of week, month of year and year Mood & Affect: normal affect Attention Span & Concentration: Fair/fair  Cognition: Is intact Recent and remote memories: Seems Intact and age appropriate Insight and judgment: poor/poor  Language and Fund of Knowledge: Fair to poor  Medical Decision Making (Choose Three): Established Problem, Stable/Improving (1), Review of Psycho-Social Stressors (1), Established Problem, Worsening (2), Review of Last Therapy Session (1), Review of Medication Regimen & Side Effects (2) and Review of New Medication or Change in Dosage (2)  Assessment:   SCHIZOPHRENIA- UNDIFFERENTIATED TYPE,   Oppositional defiant disorder Insomnia Borderline diabetes Polycystic ovarian disease     Plan: Schizophrenia Patient will continue Abilify 400 mg IM injection. Every month.  Continue Cogentin 1 mg daily for EPS.   Insomnia: start trial of Benadryl12.5-25mg  po qHS prn insomnia  Obesity Continue walking daily and making healthy choices in regards to food as patient has maintained weight loss since her last visit.   Labs None at this visit. Low hemoglobin Patient was asked to start taking an iron pill every day. Continue to  follow-up with her primary care physician  Pt denies SI and is at an acute low risk for suicide.Patient told to call clinic if any problems occur. Patient advised to go to ER if they should develop SI/HI, side effects, or if symptoms worsen. Has crisis numbers to call if needed. Pt verbalized understanding.  F/up in 2 months or sooner if needed   [name], MD

## 2016-03-09 ENCOUNTER — Ambulatory Visit (INDEPENDENT_AMBULATORY_CARE_PROVIDER_SITE_OTHER): Payer: Medicaid Other

## 2016-03-09 DIAGNOSIS — F201 Disorganized schizophrenia: Secondary | ICD-10-CM

## 2016-03-09 MED ORDER — ARIPIPRAZOLE ER 400 MG IM SUSR
400.0000 mg | INTRAMUSCULAR | Status: DC
  Administered 2016-03-09: 400 mg via INTRAMUSCULAR

## 2016-03-09 NOTE — Progress Notes (Signed)
Patient ID: Natalie Wagner, female   DOB: August 17, 1996, 20 y.o.   MRN: [id number] Patient presents today with appropriate affect for her injection of Abilify Maintena 400 mg, Patient stated she had no problems with eating or sleeping, no SI/HI and no visual or audio hallucinations. Patient expressed concern about gaining weight and this writer counciled her on appropriate snacks of protein and to avoid a lot of carbohydrates. Injection of Abilify Maintena 400 mg was prepared as ordered and administered IM in patients right upper outer gluteal region. Patient tolerated the procedure well and without complaint, and has agreed to come back in 30 days.

## 2016-03-23 ENCOUNTER — Telehealth: Payer: Self-pay | Admitting: *Deleted

## 2016-03-23 ENCOUNTER — Ambulatory Visit: Payer: Medicaid Other | Admitting: Pediatrics

## 2016-03-23 NOTE — Telephone Encounter (Addendum)
Patient called on 5/13 and left a vague message "I have the same problem as before." This writer was unable to understand the phone number she left and was unable to reach her at the numbers we have on file. Left a vm on mom's cell asking her to have the patient call the clinic again. She will likely need to be seen. 03/23/16 late entry Patient called back and is having symptoms of vaginitis. We made an appointment for the afternoon, she was going to call if she could not get a ride, however she did not call and did not show.

## 2016-03-26 ENCOUNTER — Ambulatory Visit (HOSPITAL_COMMUNITY): Payer: Self-pay | Admitting: Psychiatry

## 2016-03-26 NOTE — Telephone Encounter (Signed)
-----   Message from Dierdre Harness, NP sent at [id number]  3:02 PM EDT ----- Regarding: FW: appointment Contact: [phone] I called patient and could not reach her.  Will you try again to get her in this week if she needs [place].  Thx -cm  ----- Message -----    From: Lolita Rieger, RN    Sent: 03/24/2016  10:53 AM      To: Dierdre Harness, NP Subject: appointment                                    No show yesterday for suspected vaginitis.

## 2016-03-26 NOTE — Telephone Encounter (Signed)
LVM w/ pt requesting callback if f/u appt still needed.

## 2016-03-30 ENCOUNTER — Ambulatory Visit: Payer: Medicaid Other | Admitting: Pediatrics

## 2016-04-01 ENCOUNTER — Encounter: Payer: Self-pay | Admitting: Pediatrics

## 2016-04-01 ENCOUNTER — Ambulatory Visit (INDEPENDENT_AMBULATORY_CARE_PROVIDER_SITE_OTHER): Payer: Medicaid Other | Admitting: Pediatrics

## 2016-04-01 VITALS — BP 114/62 | HR 72 | Ht <= 58 in | Wt 113.6 lb

## 2016-04-01 DIAGNOSIS — F201 Disorganized schizophrenia: Secondary | ICD-10-CM

## 2016-04-01 DIAGNOSIS — E282 Polycystic ovarian syndrome: Secondary | ICD-10-CM

## 2016-04-01 DIAGNOSIS — L700 Acne vulgaris: Secondary | ICD-10-CM | POA: Diagnosis not present

## 2016-04-01 DIAGNOSIS — L680 Hirsutism: Secondary | ICD-10-CM | POA: Diagnosis not present

## 2016-04-01 MED ORDER — NORELGESTROMIN-ETH ESTRADIOL 150-35 MCG/24HR TD PTWK: 1.0000 | MEDICATED_PATCH | TRANSDERMAL | Status: DC

## 2016-04-01 NOTE — Patient Instructions (Addendum)
Healthy vaginal hygiene practices   -  Avoid sleeper pajamas. Nightgowns allow air to circulate.  Sleep without underpants whenever possible.  -  Wear cotton underpants during the day. Double-rinse underwear after washing to avoid residual irritants. Do not use fabric softeners for underwear and swimsuits.  - Avoid tights, leotards, leggings, "skinny" jeans, and other tight-fitting clothing. Skirts and loose-fitting pants allow air to circulate.  - Avoid pantyliners.  Instead use tampons or cotton pads.  - Daily warm bathing is helpful:     - Soak in clean water (no soap) for 10 to 15 minutes. Adding vinegar or baking soda to the water has not been specifically studied and may not be better than clean water alone.      - Use soap to wash regions other than the genital area just before getting out of the tub. Limit use of any soap on genital areas. Use fragance-free soaps.     - Rinse the genital area well and gently pat dry.  Don't rub.  Hair dryer to assist with drying can be used only if on cool setting.     - Do not use bubble baths or perfumed soaps.  - Do not use any feminine sprays, douches or powders.  These contain chemicals that will irritate the skin.  - If the genital area is tender or swollen, cool compresses may relieve the discomfort. Unscented wet wipes can be used instead of toilet paper for wiping.   - Emollients, such as Vaseline, may help protect skin and can be applied to the irritated area.  - Always remember to wipe front-to-back after bowel movements. Pat dry after urination.  - Do not sit in wet swimsuits for long periods of time after swimming     Ethinyl Estradiol; Norelgestromin skin patches What is this medicine? ETHINYL ESTRADIOL;NORELGESTROMIN (ETH in il es tra DYE ole; nor el JES troe min) skin patch is used as a contraceptive (birth control method). This medicine combines two types of female hormones, an estrogen and a progestin. This patch is used to  prevent ovulation and pregnancy. This medicine may be used for other purposes; ask your health care provider or pharmacist if you have questions. What should I tell my health care provider before I take this medicine? They need to know if you have or ever had any of these conditions: -abnormal vaginal bleeding -blood vessel disease or blood clots -breast, cervical, endometrial, ovarian, liver, or uterine cancer -diabetes -gallbladder disease -heart disease or recent heart attack -high blood pressure -high cholesterol -kidney disease -liver disease -migraine headaches -stroke -systemic lupus erythematosus (SLE) -tobacco smoker -an unusual or allergic reaction to estrogens, progestins, other medicines, foods, dyes, or preservatives -pregnant or trying to get pregnant -breast-feeding How should I use this medicine? This patch is applied to the skin. Follow the directions on the prescription label. Apply to clean, dry, healthy skin on the buttock, abdomen, upper outer arm or upper torso, in a place where it will not be rubbed by tight clothing. Do not use lotions or other cosmetics on the site where the patch will go. Press the patch firmly in place for 10 seconds to ensure good contact with the skin. Change the patch every 7 days on the same day of the week for 3 weeks. You will then have a break from the patch for 1 week, after which you will apply a new patch. Do not use your medicine more often than directed. Contact your pediatrician regarding the use  of this medicine in children. Special care may be needed. This medicine has been used in female children who have started having menstrual periods. A patient package insert for the product will be given with each prescription and refill. Read this sheet carefully each time. The sheet may change frequently. Overdosage: If you think you have taken too much of this medicine contact a poison control center or emergency room at once. NOTE: This  medicine is only for you. Do not share this medicine with others. What if I miss a dose? You will need to replace your patch once a week as directed. If your patch is lost or falls off, contact your health care professional for advice. You may need to use another form of birth control if your patch has been off for more than 1 day. What may interact with this medicine? -acetaminophen -antibiotics or medicines for infections, especially rifampin, rifabutin, rifapentine, and griseofulvin, and possibly penicillins or tetracyclines -aprepitant -ascorbic acid (vitamin C) -atorvastatin -barbiturate medicines, such as phenobarbital -bosentan -carbamazepine -caffeine -clofibrate -cyclosporine -dantrolene -doxercalciferol -felbamate -grapefruit juice -hydrocortisone -medicines for anxiety or sleeping problems, such as diazepam or temazepam -medicines for diabetes, including pioglitazone -modafinil -mycophenolate -nefazodone -oxcarbazepine -phenytoin -prednisolone -ritonavir or other medicines for HIV infection or AIDS -rosuvastatin -selegiline -soy isoflavones supplements -St. John's wort -tamoxifen or raloxifene -theophylline -thyroid hormones -topiramate -warfarin This list may not describe all possible interactions. Give your health care provider a list of all the medicines, herbs, non-prescription drugs, or dietary supplements you use. Also tell them if you smoke, drink alcohol, or use illegal drugs. Some items may interact with your medicine. What should I watch for while using this medicine? Visit your doctor or health care professional for regular checks on your progress. You will need a regular breast and pelvic exam and Pap smear while on this medicine. Use an additional method of contraception during the first cycle that you use this patch. If you have any reason to think you are pregnant, stop using this medicine right away and contact your doctor or health care  professional. If you are using this medicine for hormone related problems, it may take several cycles of use to see improvement in your condition. Smoking increases the risk of getting a blood clot or having a stroke while you are using hormonal birth control, especially if you are more than 20 years old. You are strongly advised not to smoke. This medicine can make your body retain fluid, making your fingers, hands, or ankles swell. Your blood pressure can go up. Contact your doctor or health care professional if you feel you are retaining fluid. This medicine can make you more sensitive to the sun. Keep out of the sun. If you cannot avoid being in the sun, wear protective clothing and use sunscreen. Do not use sun lamps or tanning beds/booths. If you wear contact lenses and notice visual changes, or if the lenses begin to feel uncomfortable, consult your eye care specialist. In some women, tenderness, swelling, or minor bleeding of the gums may occur. Notify your dentist if this happens. Brushing and flossing your teeth regularly may help limit this. See your dentist regularly and inform your dentist of the medicines you are taking. If you are going to have elective surgery or a MRI, you may need to stop using this medicine before the surgery or MRI. Consult your health care professional for advice. This medicine does not protect you against HIV infection (AIDS) or any other sexually transmitted diseases.  What side effects may I notice from receiving this medicine? Side effects that you should report to your doctor or health care professional as soon as possible: -breast tissue changes or discharge -changes in vaginal bleeding during your period or between your periods -chest pain -coughing up blood -dizziness or fainting spells -headaches or migraines -leg, arm or groin pain -severe or sudden headaches -stomach pain (severe) -sudden shortness of breath -sudden loss of coordination, especially  on one side of the body -speech problems -symptoms of vaginal infection like itching, irritation or unusual discharge -tenderness in the upper abdomen -vomiting -weakness or numbness in the arms or legs, especially on one side of the body -yellowing of the eyes or skin Side effects that usually do not require medical attention (report to your doctor or health care professional if they continue or are bothersome): -breakthrough bleeding and spotting that continues beyond the 3 initial cycles of pills -breast tenderness -mood changes, anxiety, depression, frustration, anger, or emotional outbursts -increased sensitivity to sun or ultraviolet light -nausea -skin rash, acne, or brown spots on the skin -weight gain (slight) This list may not describe all possible side effects. Call your doctor for medical advice about side effects. You may report side effects to FDA at 1-800-FDA-1088. Where should I keep my medicine? Keep out of the reach of children. Store at room temperature between 15 and 30 degrees C (59 and 86 degrees F). Keep the patch in its pouch until time of use. Throw away any unused medicine after the expiration date. Dispose of used patches properly. Since a used patch may still contain active hormones, fold the patch in half so that it sticks to itself prior to disposal. Throw away in a place where children or pets cannot reach. NOTE: This sheet is a summary. It may not cover all possible information. If you have questions about this medicine, talk to your doctor, pharmacist, or health care provider.    2016, [employer]/Gold Standard. (2008-10-11 [id number])

## 2016-04-01 NOTE — Progress Notes (Signed)
THIS RECORD MAY CONTAIN CONFIDENTIAL INFORMATION THAT SHOULD NOT BE RELEASED WITHOUT REVIEW OF THE SERVICE PROVIDER.  Adolescent Medicine Consultation Follow-Up Visit Natalie Wagner  is a 20 y.o. female referred by Sarajane Jews, * here today for follow-up.    Previsit planning completed:  yes  Growth Chart Viewed? yes   History was provided by the patient and mother.  PCP Confirmed?  yes  My Chart Activated?   no   HPI:    After she went off menstrual cycle thins cleared up and she didn't need. She has stopped sprintec because she thought it might interact with antibiotic. She got her cycles all messed up with the pills.  Hair growth and pimples have started after stopping birth control.  Wondering about vaginal health.  Reads lots of concerns on google and has many questions.  She is very concerned that she has continued to gain weight although she has lost another 5 pounds since her last psych visit.  She recently got the injectable abilify which seems to have stabilized her to some degree.    Review of Systems  Constitutional: Negative for weight loss and malaise/fatigue.  Eyes: Negative for blurred vision.  Respiratory: Negative for shortness of breath.   Cardiovascular: Negative for chest pain and palpitations.  Gastrointestinal: Negative for nausea, vomiting, abdominal pain and constipation.  Genitourinary: Negative for dysuria.  Musculoskeletal: Negative for myalgias.  Neurological: Negative for dizziness and headaches.  Psychiatric/Behavioral: Negative for depression.     No LMP recorded (lmp unknown). Allergies  Allergen Reactions  . Lactose Intolerance (Gi) Diarrhea   Outpatient Prescriptions Prior to Visit  Medication Sig Dispense Refill  . acetaminophen (TYLENOL) 500 MG tablet Take 1,000 mg by mouth every 6 (six) hours as needed for headache. Reported on 02/17/2016    . albuterol (PROAIR HFA) 108 (90 BASE) MCG/ACT inhaler Inhale 2 puffs into the  lungs every 4 (four) hours as needed for wheezing or shortness of breath. 2 Inhaler 11  . ARIPiprazole 400 MG SUSR Inject 400 mg into the muscle every 30 (thirty) days. 1 each 4  . benztropine (COGENTIN) 1 MG tablet Take 1 tablet (1 mg total) by mouth 2 (two) times daily. 180 tablet 0  . diphenhydrAMINE (BENADRYL CHILDRENS ALLERGY) 12.5 MG/5ML liquid Take 10 mLs (25 mg total) by mouth at bedtime as needed for sleep. 118 mL 0  . ibuprofen (ADVIL,MOTRIN) 200 MG tablet Take 400 mg by mouth every 6 (six) hours as needed for moderate pain. Reported on 02/17/2016    . Melatonin 5 MG TABS Take 1 tablet by mouth at bedtime.    . polyethylene glycol powder (GLYCOLAX/MIRALAX) powder Take 255 g by mouth once. Take 1 capful once a day to have a soft stool daily. Can increase or decrease as needed 255 g 11  . SPRINTEC 28 0.25-35 MG-MCG tablet Take 1 tablet by mouth daily. Reported on 02/17/2016  11   No facility-administered medications prior to visit.     Patient Active Problem List   Diagnosis Date Noted  . Hirsutism 04/01/2016  . Simple cyst of kidney 01/24/2016  . Convulsions ([place]) 09/03/2015  . 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 Acanthosis 05/20/2015  . CN (constipation) 12/27/2014  . Therapeutic drug monitoring 06/03/2014  . Hyperlipidemia LDL goal <130 05/23/2014  . Lactose intolerance documented with breath testing 10/25/2013  . Finger mass, left 08/30/2013  . Irritable bowel syndrome 05/23/2013  . 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 Acne vulgaris 05/05/2013  . Allergic  rhinitis 05/05/2013  . PCOS (polycystic ovarian syndrome) 05/05/2013  . Snoring 05/05/2013  . 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  Physical Exam:  Filed Vitals:   04/01/16 1058  BP: 114/62   Pulse: 72  Height: 4\' 10"  (1.473 m)  Weight: 113 lb 9.6 oz (51.529 kg)   BP 114/62 mmHg  Pulse 72  Ht 4\' 10"  (1.473 m)  Wt 113 lb 9.6 oz (51.529 kg)  BMI 23.75 kg/m2  LMP  (LMP Unknown) Body mass index: body mass index is 23.75 kg/(m^2). Blood pressure percentiles are [id number] systolic and [id number] diastolic based on [id number] NHANES data. Blood pressure percentile targets: 90: 119/76, 95: 123/80, 99 + 5 mmHg: 135/92.  Physical Exam  Constitutional: She is oriented to person, place, and time. She appears well-developed and well-nourished.  HENT:  Head: Normocephalic.  Neck: No thyromegaly present.  Cardiovascular: Normal rate, regular rhythm, normal heart sounds and intact distal pulses.   Pulmonary/Chest: Effort normal and breath sounds normal.  Abdominal: Soft. Bowel sounds are normal. There is no tenderness.  Musculoskeletal: Normal range of motion.  Neurological: She is alert and oriented to person, place, and time.  Skin: Skin is warm and dry.  Psychiatric: She has a normal mood and affect. Her speech is tangential. She expresses impulsivity.    Assessment/Plan: 1. PCOS (polycystic ovarian syndrome) Will restart patches or pills. She was unable to come to a decision in the visit as she was very distracted by other questions to ask me and started googling things during our visit. Provided with information for both and mom will help her decide. Could consider spironolactone in the future but would want more reliable contraception on board. She has continued to lose weight despite being worried about gaining. We discussed focusing on eating well and stopping weight loss at this point.  - norelgestromin-ethinyl estradiol (ORTHO EVRA) 150-35 MCG/24HR transdermal patch; Place 1 patch onto the skin once a week.  Dispense: 3 patch; Refill: 12  2. Acne vulgaris Has worsened without estrogen.   3. Hirsutism Worsened without estrogen.   4. 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 She will continue to see psych and receive injectable abilify.     Follow-up:  Return in about 3 months (around 07/02/2016).   Medical decision-making:  > 25 minutes spent, more than 50% of appointment was spent discussing diagnosis and management of symptoms

## 2016-04-09 ENCOUNTER — Ambulatory Visit (INDEPENDENT_AMBULATORY_CARE_PROVIDER_SITE_OTHER): Payer: Medicaid Other

## 2016-04-09 VITALS — BP 110/64 | HR 91 | Ht <= 58 in | Wt 115.0 lb

## 2016-04-09 DIAGNOSIS — F201 Disorganized schizophrenia: Secondary | ICD-10-CM | POA: Diagnosis not present

## 2016-04-09 MED ORDER — ARIPIPRAZOLE ER 400 MG IM SUSR
400.0000 mg | INTRAMUSCULAR | Status: DC
  Administered 2016-04-09: 400 mg via INTRAMUSCULAR

## 2016-04-09 NOTE — Progress Notes (Signed)
Patient ID: Natalie Wagner, female   DOB: Jun 02, 1996, 20 y.o.   MRN: [id number] Patient presents today with an appropriate affect for her injection of Abilify Maintena 400 mg. Patient reports that she is not sleeping or eating very well, has no SI/HI and no auditory or visual hallucinations. The injection was prepared as ordered and administered in patients left upper outer gluteal quadrant. Patient tolerated the procedure well and without complaint. She has agreed to return in 30 days.

## 2016-05-11 ENCOUNTER — Ambulatory Visit (HOSPITAL_COMMUNITY): Payer: Medicaid Other

## 2016-05-11 DIAGNOSIS — F201 Disorganized schizophrenia: Secondary | ICD-10-CM

## 2016-05-11 MED ORDER — ARIPIPRAZOLE ER 400 MG IM SUSR
400.0000 mg | INTRAMUSCULAR | Status: DC
  Administered 2016-05-11: 400 mg via INTRAMUSCULAR

## 2016-05-11 NOTE — Progress Notes (Signed)
Patient ID: Natalie Wagner, female   DOB: 1996/01/08, 20 y.o.   MRN: [id number] Patient presents today with an appropriate affect for her injection of Abilify Maintena 400 mg. Patient reports that she is not sleeping or eating very well, has no SI/HI and no auditory or visual hallucinations. Patients mother is also here and reports that patient is up all night and sleeping all day. The patient asked if this was a side effect of the medication, and I had a discussion with her about sleep hygiene, cutting of electronics and trying to get to bed sooner. I think she just needs to get back to a regular routine. Patient will try this and report back next time she is in. The injection of Abilify Maintena 400 mg was prepared as ordered and administered IM  in patients right upper outer gluteal quadrant. Patient tolerated the procedure well and without complaint. She has agreed to return in 30 days.

## 2016-05-14 ENCOUNTER — Encounter (HOSPITAL_COMMUNITY): Payer: Self-pay | Admitting: Psychiatry

## 2016-05-14 ENCOUNTER — Ambulatory Visit (INDEPENDENT_AMBULATORY_CARE_PROVIDER_SITE_OTHER): Payer: Medicaid Other | Admitting: Psychiatry

## 2016-05-14 VITALS — BP 110/60 | HR 70 | Ht 59.0 in | Wt 115.0 lb

## 2016-05-14 DIAGNOSIS — F201 Disorganized schizophrenia: Secondary | ICD-10-CM | POA: Diagnosis not present

## 2016-05-14 DIAGNOSIS — G4700 Insomnia, unspecified: Secondary | ICD-10-CM | POA: Diagnosis not present

## 2016-05-14 DIAGNOSIS — F913 Oppositional defiant disorder: Secondary | ICD-10-CM

## 2016-05-14 MED ORDER — ARIPIPRAZOLE ER 400 MG IM SUSR: 400.0000 mg | INTRAMUSCULAR | Status: DC

## 2016-05-14 MED ORDER — BENZTROPINE MESYLATE 1 MG PO TABS: 1.0000 mg | ORAL_TABLET | Freq: Two times a day (BID) | ORAL | Status: DC

## 2016-05-14 NOTE — Progress Notes (Signed)
Patient ID: Natalie Wagner, female   DOB: July 30, 1996, 20 y.o.   MRN: [id number]  [facility]  Progress Note  Natalie Wagner [id number] 20 y.o.  05/14/2016 10:46 AM  Chief Complaint: I am concerned about my meds.    History of Present Illness:  Patient seen along with her mother patient gave permission for the mother to be in the room. Patient seen today for medication follow-up.  Pt got her Abilify injection and reports that she itched for a few hours. She treated with nasal spray and an allergy tablet. Symptoms improved afterwards.  She doesn't like it.   States Cogentin helps but she is not consistently taking it. Reports that at night she has "muscle movements and shaking".   Pt has been having abdominal pain for a long while.   Pt states she is no longer having panic attacks.  Pt uses relaxation techniques that helps sometimes.  It was happening in dark places so she usually keeps her bedroom lights on.   Pt is not sleeping well. She is having broken sleep and sometimes get up in the middle of the night to wash clothes. Her mind races and pt is feeling tired.   Pt denies depression. She is bored and irritable.   Denies AVH, ideas of reference and paranoia.    Pt is not exercising anymore. Pt has lost weight feels very good about it. Pt is worried because her appetite seems increased but mom and pt both report pt has not put on weight and is eating healthy.   Mom states she has poor insight and is paranoid. Mom states when pt is alone she is talking to herself and laughing. Pt states she is watching tv. Pt is not sleeping well.   Taking meds as prescribed and denies SE.   Suicidal Ideation: No Plan Formed: No Patient has means to carry out plan: No  Homicidal Ideation: No Plan Formed: No Patient has means to carry out plan: No  Review of Systems  Constitutional: Negative for fever, weight loss and malaise/fatigue.  HENT: Negative for congestion, ear discharge  and sore throat.   Eyes: Negative for blurred vision, photophobia, discharge and redness.       Wears glasses  Respiratory: Negative for cough and wheezing.   Cardiovascular: Negative for chest pain, palpitations and orthopnea.  Gastrointestinal: Positive for abdominal pain and constipation. Negative for heartburn, nausea, vomiting and diarrhea.       Lactose intolerance  Genitourinary: Negative for dysuria and urgency.  Musculoskeletal: Positive for back pain. Negative for myalgias, joint pain, falls and neck pain.  Skin: Negative for itching and rash.  Neurological: Negative for dizziness, tingling, sensory change, seizures, loss of consciousness, weakness and headaches.  Endo/Heme/Allergies: Negative for environmental allergies.  Psychiatric/Behavioral: Negative for depression, suicidal ideas, hallucinations, memory loss and substance abuse. The patient is nervous/anxious and has insomnia.     Past Medical Family, Social History: Patient is staying at home Family History  Problem Relation Age of Onset  . Asthma Mother   . Diabetes Maternal Grandfather   . Hypertension Maternal Grandfather   . Heart disease Maternal Grandfather   . Kidney disease Maternal Grandfather   . Anesthesia problems Maternal Grandfather     hx. of being hard to wake up post-op  . Hypertension Maternal Uncle   . Hypertension Maternal Grandmother   . Stroke Maternal Grandmother    Past Medical History  Diagnosis Date  . Migraines   . Asthma  prn inhaler  . Schizophrenia ([place])   . History of seizures as a child     mother states were triggered by migraines; no seizures in > 7 yr.  . Difficulty swallowing pills   . 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 Constipation 05/05/2013  . Elevated prolactin level ([place]) 12/27/2014  . Galactorrhea 12/27/2014  . 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    Outpatient Encounter Prescriptions as of 05/14/2016  Medication Sig  . acetaminophen  (TYLENOL) 500 MG tablet Take 1,000 mg by mouth every 6 (six) hours as needed for headache. Reported on 02/17/2016  . albuterol (PROAIR HFA) 108 (90 BASE) MCG/ACT inhaler Inhale 2 puffs into the lungs every 4 (four) hours as needed for wheezing or shortness of breath.  . ARIPiprazole 400 MG SUSR Inject 400 mg into the muscle every 30 (thirty) days.  . benztropine (COGENTIN) 1 MG tablet Take 1 tablet (1 mg total) by mouth 2 (two) times daily.  . diphenhydrAMINE (BENADRYL CHILDRENS ALLERGY) 12.5 MG/5ML liquid Take 10 mLs (25 mg total) by mouth at bedtime as needed for sleep.  Marland Kitchen ibuprofen (ADVIL,MOTRIN) 200 MG tablet Take 400 mg by mouth every 6 (six) hours as needed for moderate pain. Reported on 02/17/2016  . Melatonin 5 MG TABS Take 1 tablet by mouth at bedtime.  . norgestimate-ethinyl estradiol (ORTHO-CYCLEN,SPRINTEC,PREVIFEM) 0.25-35 MG-MCG tablet Take 1 tablet by mouth daily.  . polyethylene glycol powder (GLYCOLAX/MIRALAX) powder Take 255 g by mouth once. Take 1 capful once a day to have a soft stool daily. Can increase or decrease as needed  . norelgestromin-ethinyl estradiol (ORTHO EVRA) 150-35 MCG/24HR transdermal patch Place 1 patch onto the skin once a week. (Patient not taking: Reported on 05/14/2016)   No facility-administered encounter medications on file as of 05/14/2016.    Past Psychiatric History/Hospitalization(s): Anxiety: No Bipolar Disorder: No Depression: No Mania: No Psychosis: Yes Schizophrenia: Yes Personality Disorder: No Hospitalization for psychiatric illness: Yes History of Electroconvulsive Shock Therapy: No Prior Suicide Attempts: Yes  Physical Exam:AIMS score is 0 Constitutional: Blood pressure 110/60, pulse 70, height 4\' 11"  (1.499 m), weight 115 lb (52.164 kg). General Appearance: alert, oriented, no acute distress and obese  Musculoskeletal: Strength & Muscle Tone: within normal limits Gait & Station: normal Patient leans: straight  Mental Status  Examination/Evaluation: Objective: Attitude: Calm and cooperative  Appearance: Casual, appears to be stated age  Eye Contact::  Fair  Speech:  pushed  Volume:  up and down, mostly low  Mood:  euthymic  Affect:  Flat  Thought Process:  Slow, concrete  Orientation:  Full (Time, Place, and Person)  Thought Content:  Logical  Suicidal Thoughts:  No  Homicidal Thoughts:  No  Judgement:  Poor  Insight:  Lacking  Concentration: good  Memory: Immediate-fair Recent-fair Remote-fair  Recall: fair  Language: fair  Gait and Station: normal  [employer] of Knowledge: average  Psychomotor Activity:  Normal  Akathisia:  No  Handed:  Right  AIMS (if indicated):  Facial and Oral Movements  Muscles of Facial Expression: None, normal  Lips and Perioral Area: None, normal  Jaw: None, normal  Tongue: None, normal Extremity Movements: Upper (arms, wrists, hands, fingers): None, normal  Lower (legs, knees, ankles, toes): None, normal,  Trunk Movements:  Neck, shoulders, hips: None, normal,  Overall Severity : Severity of abnormal movements (highest score from questions above): None, normal  Incapacitation due to abnormal movements: None, normal  Patient's awareness of  abnormal movements (rate only patient's report): No Awareness, Dental Status  Current problems with teeth and/or dentures?: No  Does patient usually wear dentures?: No    Assets:  Desire for Improvement Housing Transportation       Medical Decision Making (Choose Three): Established Problem, Stable/Improving (1), Review of Psycho-Social Stressors (1), Established Problem, Worsening (2), Review of Last Therapy Session (1), Review of Medication Regimen & Side Effects (2) and Review of New Medication or Change in Dosage (2)  Assessment:   SCHIZOPHRENIA- UNDIFFERENTIATED TYPE,   Oppositional defiant disorder Insomnia Borderline diabetes Polycystic ovarian disease     Plan: Schizophrenia Patient will continue  Abilify 400 mg IM injection. Every month.  Continue Cogentin 1 mg daily for EPS.   Insomnia:  Benadryl 12.5-25mg  po qHS prn insomnia  Obesity Continue walking daily and making healthy choices in regards to food as patient has maintained weight loss since her last visit.   Labs None at this visit. Low hemoglobin Patient was asked to start taking an iron pill every day. Continue to follow-up with her primary care physician  Pt denies SI and is at an acute low risk for suicide.Patient told to call clinic if any problems occur. Patient advised to go to ER if they should develop SI/HI, side effects, or if symptoms worsen. Has crisis numbers to call if needed. Pt verbalized understanding.  F/up in 2 months or sooner if needed   [name], MD

## 2016-05-27 ENCOUNTER — Other Ambulatory Visit: Payer: Self-pay | Admitting: Pediatrics

## 2016-06-04 ENCOUNTER — Telehealth: Payer: Self-pay | Admitting: *Deleted

## 2016-06-04 ENCOUNTER — Encounter: Payer: Self-pay | Admitting: Pediatrics

## 2016-06-04 NOTE — Telephone Encounter (Signed)
VM from pt. States that she has questions, requested callback: [phone].   TC to pt. States that she has questions about hemhrorids. States that she thinks she has hemhroids, and thinks that they are big, has had some bleeding, and would like to know if she can take anything over the counter. Pt agreeable to be seen in office for eval, as this is a new problem.   Appt made for f/u appt tomorrow for NP eval.

## 2016-06-05 ENCOUNTER — Encounter: Payer: Self-pay | Admitting: Family

## 2016-06-05 ENCOUNTER — Ambulatory Visit (INDEPENDENT_AMBULATORY_CARE_PROVIDER_SITE_OTHER): Payer: Medicaid Other | Admitting: Family

## 2016-06-05 VITALS — BP 114/68 | HR 80 | Ht <= 58 in | Wt 112.4 lb

## 2016-06-05 DIAGNOSIS — K625 Hemorrhage of anus and rectum: Secondary | ICD-10-CM

## 2016-06-05 DIAGNOSIS — L298 Other pruritus: Secondary | ICD-10-CM | POA: Diagnosis not present

## 2016-06-05 DIAGNOSIS — N898 Other specified noninflammatory disorders of vagina: Secondary | ICD-10-CM

## 2016-06-05 NOTE — Progress Notes (Signed)
THIS RECORD MAY CONTAIN CONFIDENTIAL INFORMATION THAT SHOULD NOT BE RELEASED WITHOUT REVIEW OF THE SERVICE PROVIDER.  Adolescent Medicine Consultation Follow-Up Visit Natalie Wagner  is a 20 y.o. female referred by Natalie Wagner, * here today for follow-up.    Previsit planning completed:  yes  Growth Chart Viewed? yes   History was provided by the patient and mother.  PCP Confirmed?  no  My Chart Activated?   Unknown   HPI:    Natalie Wagner is a 20 yo F with pmh of disorganized schizophrena and constipation who presents with rectal bleeding. Mother present for visit.   It has been present for the past few days or so following menses.  She thinks it has worsened since drinking Lactose-free milk and having worsening constipation, followed by taking a laxative that gave her loose stools.  She now has noticed blood-streaked stools and is concerned that she has a hemorrhoid.  She also complains of rectal pruritus. She also reports having increased vaginal discharge with some itching.   Review of Systems  Constitutional: Positive for weight loss (increased exercise ).  Respiratory: Negative.   Cardiovascular: Negative.   Gastrointestinal: Positive for constipation. Negative for blood in stool.  Skin: Negative.   Psychiatric/Behavioral: The patient is nervous/anxious.    No LMP recorded (within weeks). Allergies  Allergen Reactions  . Lactose Intolerance (Gi) Diarrhea   Outpatient Medications Prior to Visit  Medication Sig Dispense Refill  . acetaminophen (TYLENOL) 500 MG tablet Take 1,000 mg by mouth every 6 (six) hours as needed for headache. Reported on 02/17/2016    . albuterol (PROAIR HFA) 108 (90 BASE) MCG/ACT inhaler Inhale 2 puffs into the lungs every 4 (four) hours as needed for wheezing or shortness of breath. 2 Inhaler 11  . ARIPiprazole ER 400 MG SUSR Inject 400 mg into the muscle every 30 (thirty) days. 1 each 4  . benztropine (COGENTIN) 1 MG tablet Take 1  tablet (1 mg total) by mouth 2 (two) times daily. 180 tablet 0  . diphenhydrAMINE (BENADRYL CHILDRENS ALLERGY) 12.5 MG/5ML liquid Take 10 mLs (25 mg total) by mouth at bedtime as needed for sleep. 118 mL 0  . ibuprofen (ADVIL,MOTRIN) 200 MG tablet Take 400 mg by mouth every 6 (six) hours as needed for moderate pain. Reported on 02/17/2016    . Melatonin 5 MG TABS Take 1 tablet by mouth at bedtime.    . norelgestromin-ethinyl estradiol (ORTHO EVRA) 150-35 MCG/24HR transdermal patch Place 1 patch onto the skin once a week. (Patient not taking: Reported on 05/14/2016) 3 patch 12  . norgestimate-ethinyl estradiol (ORTHO-CYCLEN,SPRINTEC,PREVIFEM) 0.25-35 MG-MCG tablet Take 1 tablet by mouth daily.    . polyethylene glycol powder (GLYCOLAX/MIRALAX) powder Take 255 g by mouth once. Take 1 capful once a day to have a soft stool daily. Can increase or decrease as needed 255 g 11  . SPRINTEC 28 0.25-35 MG-MCG tablet TAKE 1 TABLET BY MOUTH EVERY DAY 28 tablet 11   No facility-administered medications prior to visit.      Patient Active Problem List   Diagnosis Date Noted  . Hirsutism 04/01/2016  . Simple cyst of kidney 01/24/2016  . Convulsions ([place]) 09/03/2015  . 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 Acanthosis 05/20/2015  . CN (constipation) 12/27/2014  . Therapeutic drug monitoring 06/03/2014  . Hyperlipidemia LDL goal <130 05/23/2014  . Lactose intolerance documented with breath testing 10/25/2013  . Finger mass, left 08/30/2013  . Irritable bowel syndrome 05/23/2013  .  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 Acne vulgaris 05/05/2013  . Allergic rhinitis 05/05/2013  . PCOS (polycystic ovarian syndrome) 05/05/2013  . Snoring 05/05/2013  . 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 Patient denies excessive laxative use and mom concurs. She uses Miralax daily for constipation.   Confidentiality was  discussed with the patient and if applicable, with caregiver as well.   The following portions of the patient's history were reviewed and updated as appropriate: allergies, current medications, past family history, past medical history, past social history, past surgical history and problem list.  Physical Exam:  Vitals:   06/05/16 1047  BP: 114/68  Pulse: 80  Weight: 112 lb 6.4 oz (51 kg)  Height: 4\' 10"  (1.473 m)   BP 114/68   Pulse 80   Ht 4\' 10"  (1.473 m)   Wt 112 lb 6.4 oz (51 kg)   LMP  (Within Weeks)   BMI 23.49 kg/m  Body mass index: body mass index is 23.49 kg/m. Blood pressure percentiles are 79 % systolic and 73 % diastolic based on NHBPEP's 4th Report. Blood pressure percentile targets: 90: 119/75, 95: 123/79, 99 + 5 mmHg: 135/92.  Physical Exam  GEN: anxious-appearing young female in NAD HEENT: wearing glasses and head wrap, EOMI, MMM GU: copious milky white vaginal discharge noted Rectal: no external hemmorhoids or varicosities noted; skin intact with no visible lesions.    Assessment/Plan: Natalie Wagner is a 20 yo F with pmh of disorganized schizophrenia and constipation here w/ rectal bleeding.  1. Rectal bleeding -history consistent with rectal bleeding noted from bowel strain, however also cannot rule out vaginal bleeding in the context of recent menses. Exam was normal with no indication of external hemorrhoids. Wet prep taken today due to history of vaginal itching with increased discharge. Advised patient to avoid increased laxative use; continue daily Miralax. Report new or worsening symptoms to PCP.     2. Vaginal itching As per above. Patient and mother notified that we would call with results. Low suspicion for yeast; exam more consistent with normal vaginal mucosa.  - WET PREP BY MOLECULAR PROBE   Follow-up:  Return if symptoms worsen or fail to improve.   Medical decision-making:  >15 minutes spent, more than 50% of appointment was spent  discussing diagnosis, assessment, and plan of care as described above.

## 2016-06-06 LAB — WET PREP BY MOLECULAR PROBE
Candida species: NEGATIVE
Gardnerella vaginalis: POSITIVE — AB
Trichomonas vaginosis: NEGATIVE

## 2016-06-07 ENCOUNTER — Other Ambulatory Visit: Payer: Self-pay | Admitting: Family

## 2016-06-07 DIAGNOSIS — B9689 Other specified bacterial agents as the cause of diseases classified elsewhere: Secondary | ICD-10-CM

## 2016-06-07 DIAGNOSIS — N760 Acute vaginitis: Principal | ICD-10-CM

## 2016-06-07 MED ORDER — METRONIDAZOLE 500 MG PO TABS: 500.0000 mg | ORAL_TABLET | Freq: Two times a day (BID) | ORAL | 0 refills | Status: AC

## 2016-06-08 ENCOUNTER — Ambulatory Visit: Payer: Medicaid Other | Admitting: Pediatrics

## 2016-06-08 ENCOUNTER — Encounter: Payer: Self-pay | Admitting: Pediatrics

## 2016-06-08 NOTE — Progress Notes (Signed)
Spoke with Natalie Wagner via phone given that we just saw her on Friday. She has not started the treatment for her BV yet but is very concerned that we "need to look again." Had a long conversation about beginning treatment and seeing [place] if things are not improving. Her speech was at times hard to understand and not always logical. Her mother can't bring her today at 4 pm as she has an interview. She will call back to schedule later in the week if she is not better.

## 2016-06-09 ENCOUNTER — Telehealth: Payer: Self-pay

## 2016-06-09 NOTE — Telephone Encounter (Signed)
Pt called and states her Flagyl was not sent to pharmacy. Called [facility] pharmacy off of [address] to ensure medication was sent and RX did go through. Called pt to let them know to pick it up there. No answer and no VM available.

## 2016-06-11 ENCOUNTER — Encounter (HOSPITAL_COMMUNITY): Payer: Self-pay | Admitting: Emergency Medicine

## 2016-06-11 ENCOUNTER — Telehealth (HOSPITAL_COMMUNITY): Payer: Self-pay

## 2016-06-11 ENCOUNTER — Ambulatory Visit (HOSPITAL_COMMUNITY): Payer: Self-pay

## 2016-06-11 ENCOUNTER — Emergency Department (HOSPITAL_COMMUNITY)
Admission: EM | Admit: 2016-06-11 | Discharge: 2016-06-12 | Payer: Medicaid Other | Attending: Emergency Medicine | Admitting: Emergency Medicine

## 2016-06-11 DIAGNOSIS — Z79899 Other long term (current) drug therapy: Secondary | ICD-10-CM | POA: Insufficient documentation

## 2016-06-11 DIAGNOSIS — Z7951 Long term (current) use of inhaled steroids: Secondary | ICD-10-CM | POA: Diagnosis not present

## 2016-06-11 DIAGNOSIS — J45909 Unspecified asthma, uncomplicated: Secondary | ICD-10-CM | POA: Diagnosis not present

## 2016-06-11 DIAGNOSIS — F918 Other conduct disorders: Secondary | ICD-10-CM | POA: Diagnosis present

## 2016-06-11 DIAGNOSIS — F32A Depression, unspecified: Secondary | ICD-10-CM

## 2016-06-11 DIAGNOSIS — F329 Major depressive disorder, single episode, unspecified: Secondary | ICD-10-CM | POA: Insufficient documentation

## 2016-06-11 DIAGNOSIS — R4689 Other symptoms and signs involving appearance and behavior: Secondary | ICD-10-CM

## 2016-06-11 DIAGNOSIS — F209 Schizophrenia, unspecified: Secondary | ICD-10-CM | POA: Diagnosis not present

## 2016-06-11 DIAGNOSIS — F201 Disorganized schizophrenia: Secondary | ICD-10-CM | POA: Diagnosis present

## 2016-06-11 LAB — SALICYLATE LEVEL

## 2016-06-11 LAB — COMPREHENSIVE METABOLIC PANEL
ALBUMIN: 4.3 g/dL (ref 3.5–5.0)
ALK PHOS: 40 U/L (ref 38–126)
ALT: 21 U/L (ref 14–54)
ANION GAP: 6 (ref 5–15)
AST: 25 U/L (ref 15–41)
BILIRUBIN TOTAL: 0.8 mg/dL (ref 0.3–1.2)
BUN: 14 mg/dL (ref 6–20)
CALCIUM: 9.2 mg/dL (ref 8.9–10.3)
CO2: 27 mmol/L (ref 22–32)
Chloride: 107 mmol/L (ref 101–111)
Creatinine, Ser: 1.02 mg/dL — ABNORMAL HIGH (ref 0.44–1.00)
GFR calc non Af Amer: >60 mL/min (ref >60)
GLUCOSE: 106 mg/dL — AB (ref 65–99)
POTASSIUM: 4.1 mmol/L (ref 3.5–5.1)
Sodium: 140 mmol/L (ref 135–145)
TOTAL PROTEIN: 7.5 g/dL (ref 6.5–8.1)

## 2016-06-11 LAB — RAPID URINE DRUG SCREEN, HOSP PERFORMED
Amphetamines: NOT DETECTED
BARBITURATES: NOT DETECTED
BENZODIAZEPINES: NOT DETECTED
COCAINE: NOT DETECTED
OPIATES: NOT DETECTED
Tetrahydrocannabinol: NOT DETECTED

## 2016-06-11 LAB — CBC
HCT: 36.1 % (ref 36.0–46.0)
Hemoglobin: 11.9 g/dL — ABNORMAL LOW (ref 12.0–15.0)
MCH: 31.1 pg (ref 26.0–34.0)
MCHC: 33 g/dL (ref 30.0–36.0)
MCV: 94.3 fL (ref 78.0–100.0)
PLATELETS: 330 10*3/uL (ref 150–400)
RBC: 3.83 MIL/uL — ABNORMAL LOW (ref 3.87–5.11)
RDW: 12.1 % (ref 11.5–15.5)
WBC: 6.9 10*3/uL (ref 4.0–10.5)

## 2016-06-11 LAB — ACETAMINOPHEN LEVEL

## 2016-06-11 LAB — ETHANOL

## 2016-06-11 LAB — I-STAT BETA HCG BLOOD, ED (MC, WL, AP ONLY): I-stat hCG, quantitative: <5 m[IU]/mL (ref <5)

## 2016-06-11 MED ORDER — ALUM & MAG HYDROXIDE-SIMETH 200-200-20 MG/5ML PO SUSP: 30.0000 mL | ORAL | Status: DC | PRN

## 2016-06-11 MED ORDER — BENZTROPINE MESYLATE 1 MG PO TABS
1.0000 mg | ORAL_TABLET | Freq: Two times a day (BID) | ORAL | Status: DC
  Filled 2016-06-11 (×2): qty 1

## 2016-06-11 MED ORDER — ZOLPIDEM TARTRATE 5 MG PO TABS
5.0000 mg | ORAL_TABLET | Freq: Every evening | ORAL | Status: DC | PRN
Start: 2016-06-11 — End: 2016-06-12

## 2016-06-11 MED ORDER — IBUPROFEN 200 MG PO TABS: 600.0000 mg | ORAL_TABLET | Freq: Three times a day (TID) | ORAL | Status: DC | PRN

## 2016-06-11 MED ORDER — ACETAMINOPHEN 325 MG PO TABS: 650.0000 mg | ORAL_TABLET | ORAL | Status: DC | PRN

## 2016-06-11 MED ORDER — ONDANSETRON HCL 4 MG PO TABS: 4.0000 mg | ORAL_TABLET | Freq: Three times a day (TID) | ORAL | Status: DC | PRN

## 2016-06-11 MED ORDER — LORAZEPAM 1 MG PO TABS: 1.0000 mg | ORAL_TABLET | Freq: Three times a day (TID) | ORAL | Status: DC | PRN

## 2016-06-11 MED ORDER — METRONIDAZOLE 500 MG PO TABS
500.0000 mg | ORAL_TABLET | Freq: Two times a day (BID) | ORAL | Status: DC
  Administered 2016-06-11 – 2016-06-12 (×2): 500 mg via ORAL
  Filled 2016-06-11 (×2): qty 1

## 2016-06-11 MED ORDER — NORGESTIMATE-ETH ESTRADIOL 0.25-35 MG-MCG PO TABS
1.0000 | ORAL_TABLET | Freq: Every day | ORAL | Status: DC
Start: 2016-06-11 — End: 2016-06-12

## 2016-06-11 NOTE — ED Notes (Signed)
Pt will not be going until AM due to paperwork being incomplete.

## 2016-06-11 NOTE — ED Notes (Signed)
EDP consulted d/t pt c/o BV.

## 2016-06-11 NOTE — Progress Notes (Signed)
Pt accepted at [facility], to Dr. Laurence Ferrari [facility], RN call report at [phone]. Bed is ready now. IVC received.  [facility] RN Caryl Pina has been informed.  Verlon Setting, [place] Disposition staff 06/11/2016 6:43 PM

## 2016-06-11 NOTE — ED Notes (Signed)
Ok to move patient per Caryl Pina, [place]

## 2016-06-11 NOTE — ED Notes (Addendum)
1st opinion completed and it was reported to writer that pt can go in the AM after 9 am. Will report that to oncoming RN.
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
## 2016-06-11 NOTE — ED Notes (Signed)
[facility] office called and are on the way. EDP

## 2016-06-11 NOTE — Telephone Encounter (Signed)
Patient called me this morning and said she had questions about her medication Abilify Maintena and the side effects. I spoke with patient briefly, she stated that she could not sleep and that her back hurt, she felt this was from the medication. I told patient that these are not side effects of this medication. I told her that we could talk more when she came in and perhaps speak with Dr. [name]. Patient stated that was fine with her and that she would see me at  2 pm if she decided to have the shot. Patients mother called the main number right before her appointment time and told the front desk that patient could not come because she was being arrested. I informed Dr. [name] of this.
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
## 2016-06-11 NOTE — BH Assessment (Signed)
Spoke with EDP Dr. Jeneen Rinks in reference to 1st opinion. He agrees to complete.   Rosalin Hawking, LCSW Therapeutic Triage Specialist [place] 06/11/2016 7:38 PM
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
## 2016-06-11 NOTE — ED Notes (Signed)
Patient noted in room. No complaints, stable, in no acute distress. Q15 minute rounds and monitoring via security cameras continue for safety.

## 2016-06-11 NOTE — ED Notes (Signed)
Consulted [name],NP in regards to pt record indicating pt received Abilify Maintena IM on 05/11/2016. No new orders at this time.

## 2016-06-11 NOTE — BH Assessment (Signed)
Contacted [facility] to inform them that patient will be transported in the morning and request bed be held until that time. Spoke with intake and patients nurse who states that patient can come after 9AM.   Rosalin Hawking, LCSW Therapeutic Triage Specialist [place] 06/11/2016 7:40 PM

## 2016-06-11 NOTE — ED Notes (Signed)
[employer] at bedside 
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
## 2016-06-11 NOTE — Telephone Encounter (Signed)
Natalie Wagner called again today wanting to speak with the NP that she had spoke with the other day regarding some questions she has. Routed to [employer]. Her contact to be reached at is [phone].
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
## 2016-06-23 ENCOUNTER — Ambulatory Visit (INDEPENDENT_AMBULATORY_CARE_PROVIDER_SITE_OTHER): Payer: Medicaid Other | Admitting: Pediatrics

## 2016-06-23 ENCOUNTER — Encounter: Payer: Self-pay | Admitting: Pediatrics

## 2016-06-23 VITALS — Temp 98.7°F | Wt 117.0 lb

## 2016-06-23 DIAGNOSIS — Z3200 Encounter for pregnancy test, result unknown: Secondary | ICD-10-CM

## 2016-06-23 DIAGNOSIS — N906 Unspecified hypertrophy of vulva: Secondary | ICD-10-CM

## 2016-06-23 LAB — POCT URINE PREGNANCY: Preg Test, Ur: NEGATIVE
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
## 2016-06-24 ENCOUNTER — Telehealth (HOSPITAL_COMMUNITY): Payer: Self-pay

## 2016-06-25 NOTE — Telephone Encounter (Signed)
Called number listed on the message x2. It is a fax line.

## 2016-06-27 IMAGING — CR DG ABDOMEN 1V
1 series · 1 of 1 positions shown · non-contrast
Comparison: None.

CLINICAL DATA: RIGHT lower pelvic pain for 1 year, worsening now.
History of constipation and umbilical herniorrhaphy.

EXAM:
ABDOMEN - 1 VIEW

[t abdomen supine]
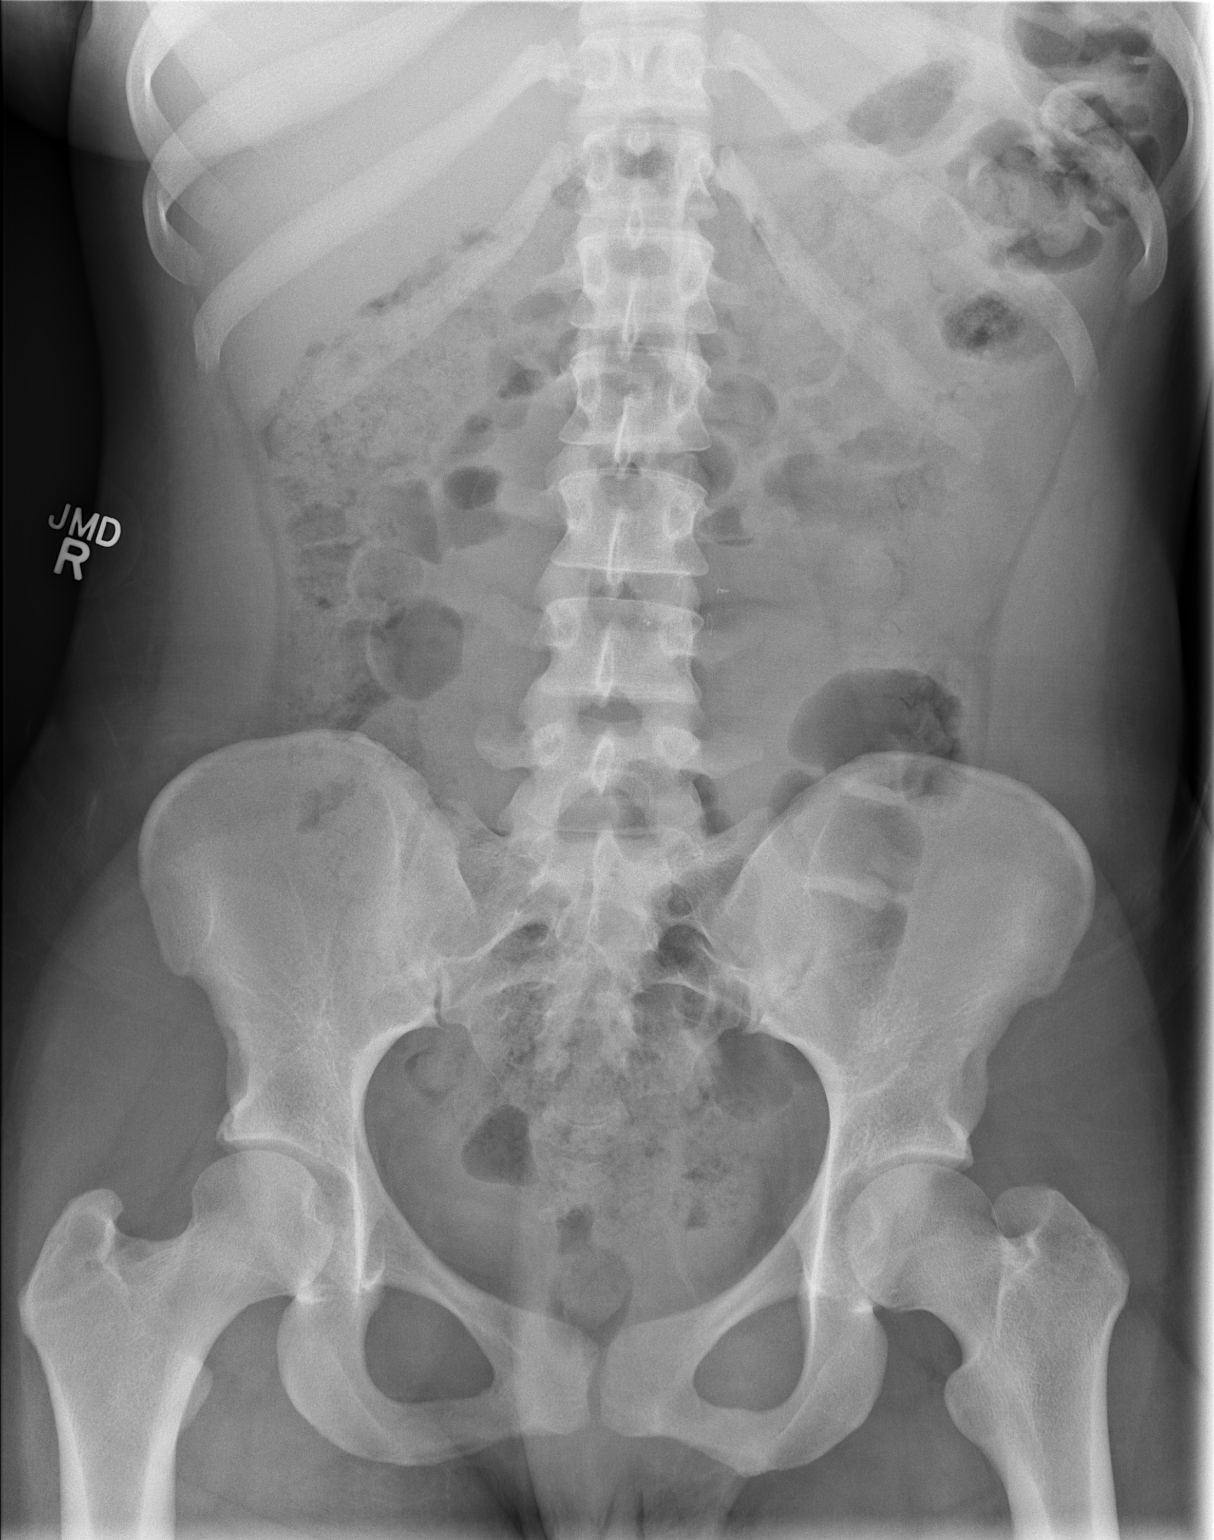

[1 of 1 positions shown; findings below may reference images not displayed]

FINDINGS: The bowel gas pattern is normal. Moderate amount of retained large
bowel stool. Mid abdominal suture material may represent umbilical
herniorrhaphy. No radio-opaque calculi or other significant
radiographic abnormality are seen.
IMPRESSION: Moderate amount of retained large bowel stool, normal bowel gas
pattern.

## 2016-06-30 NOTE — Telephone Encounter (Signed)
Called and left voicemail for Dr. [name] to give office a call back regarding patient. May speak with Dr. Abby Potash or Kristin Bruins, RN.

## 2016-06-30 NOTE — Telephone Encounter (Signed)
Sorry correct number is [phone].

## 2016-07-01 ENCOUNTER — Ambulatory Visit (INDEPENDENT_AMBULATORY_CARE_PROVIDER_SITE_OTHER): Payer: Medicaid Other | Admitting: Pediatrics

## 2016-07-01 ENCOUNTER — Encounter: Payer: Self-pay | Admitting: Pediatrics

## 2016-07-01 VITALS — BP 113/70 | HR 85 | Ht <= 58 in | Wt 112.0 lb

## 2016-07-01 DIAGNOSIS — E282 Polycystic ovarian syndrome: Secondary | ICD-10-CM | POA: Diagnosis not present

## 2016-07-01 DIAGNOSIS — Z3009 Encounter for other general counseling and advice on contraception: Secondary | ICD-10-CM

## 2016-07-01 DIAGNOSIS — L680 Hirsutism: Secondary | ICD-10-CM

## 2016-07-01 DIAGNOSIS — Z3042 Encounter for surveillance of injectable contraceptive: Secondary | ICD-10-CM

## 2016-07-01 DIAGNOSIS — F201 Disorganized schizophrenia: Secondary | ICD-10-CM

## 2016-07-01 MED ORDER — MEDROXYPROGESTERONE ACETATE 150 MG/ML IM SUSP
150.0000 mg | Freq: Once | INTRAMUSCULAR | Status: AC
  Administered 2016-07-01: 150 mg via INTRAMUSCULAR
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
## 2016-07-01 NOTE — Patient Instructions (Addendum)
Come back in 3 months again for depo or sooner if you like to get a nexplanon placed!  Stop taking your birth control pills

## 2016-07-02 NOTE — Telephone Encounter (Signed)
Called and was transferred to a line that rang for several minutes without being answered.

## 2016-07-06 ENCOUNTER — Ambulatory Visit (INDEPENDENT_AMBULATORY_CARE_PROVIDER_SITE_OTHER): Payer: Medicaid Other | Admitting: Clinical

## 2016-07-06 DIAGNOSIS — F201 Disorganized schizophrenia: Secondary | ICD-10-CM | POA: Diagnosis not present

## 2016-07-07 NOTE — Telephone Encounter (Signed)
Called and left another message regarding patient.

## 2016-07-08 ENCOUNTER — Telehealth: Payer: Self-pay

## 2016-07-08 NOTE — Telephone Encounter (Addendum)
Will route to Dr. Abby Potash as well.

## 2016-07-08 NOTE — Telephone Encounter (Signed)
I received a message this morning from Patsy Lager at [facility] (PCP) regarding this patient - Dr. Abby Potash was concerned that patient may need to have her medications reviewed due to patients behavior and thought process in her office the last few visits. I explained to Kristin Bruins that this patient is non compliant on her medications and is somewhat obsessive about side effects. Keri stated she would pass that along to the doctor and I am just letting you know. Patient is due to come and see you on 9/7. She last had her Abilify injection on 7/3.

## 2016-07-09 ENCOUNTER — Encounter (HOSPITAL_COMMUNITY): Payer: Self-pay | Admitting: Clinical

## 2016-07-09 NOTE — Progress Notes (Signed)
Comprehensive Clinical Assessment (CCA) Note  07/09/2016 Natalie Wagner [id number]  Visit Diagnosis:      ICD-9-CM ICD-10-CM   1. 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  (See scanned document in Chart Review)  CCA Part Two A  Intake/Chief Complaint:  CCA Intake With Chief Complaint CCA Part Two Date: 07/06/16 CCA Part Two Time: 0808 Chief Complaint/Presenting Problem: Depression, Schizophrenia Patients Currently Reported Symptoms/Problems: had trouble in school, trying to take care of my body, Depression. Things are too stressful  Individual's Strengths: "Hard working I guess." Individual's Preferences: "Probably more to just get everything together." Initial Clinical Notes/Concerns: Natalie Wagner is a 20 year old African American female. Who presents with schizophrenia. She states that she was first diagnosed at 9.  Natalie Wagner speaks in run on sentences and she goes far off topic when answering questions. She often steered her answer towards girls fighting, hygiene, and having too much to get back together. She reported that she talks and argues with herself in her head. She shared that she wants to get things done but has so much to do she can't get anything done. However she could not name any of the things she needs to get done.  Mental Health Symptoms Depression:  Depression: Change in energy/activity, Sleep (too much or little), Difficulty Concentrating, Irritability  Mania:     Anxiety:      Psychosis:  Psychosis: Grossly disorganized speech (Fights with herself in her head. hears another voice of her)  Trauma:  Trauma: N/A  Obsessions:  Obsessions: N/A  Compulsions:  Compulsions: N/A  Inattention:  Inattention:  (Reports a history of ADHD but can't supply detail)  Hyperactivity/Impulsivity:  Hyperactivity/Impulsivity:  (Reports a history of ADHD but can't supply details)   Oppositional/Defiant Behaviors:  Oppositional/Defiant Behaviors: N/A  Borderline Personality:  Emotional Irregularity: N/A  Other Mood/Personality Symptoms:      Mental Status Exam Appearance and self-care  Stature:  Stature: Small  Weight:  Weight: Average weight  Clothing:  Clothing: Casual  Grooming:  Grooming: Normal  Cosmetic use:  Cosmetic Use: None  Posture/gait:  Posture/Gait: Normal  Motor activity:  Motor Activity: Restless  Sensorium  Attention:  Attention: Distractible  Concentration:  Concentration: Scattered  Orientation:  Orientation: X5  Recall/memory:  Recall/Memory: Defective in Recent, Defective in Remote  Affect and Mood  Affect:  Affect: Appropriate  Mood:  Mood: Anxious, Depressed  Relating  Eye contact:  Eye Contact: Normal  Facial expression:  Facial Expression: Depressed  Attitude toward examiner:  Attitude Toward Examiner: Cooperative  Thought and Language  Speech flow: Speech Flow: Flight of Ideas  Thought content:  Thought Content: Appropriate to mood and circumstances  Preoccupation:  Preoccupations: Obsessions ("Got to get everything done, it's too much.")  Hallucinations:  Hallucinations: Auditory  Organization:     Transport planner of Knowledge:  Fund of Knowledge: Impoverished by:  (Comment) (reports difficulty in school, unable to retain information)  Intelligence:  Intelligence: Below average  Abstraction:  Abstraction: Overly abstract  Judgement:  Judgement: Poor  Reality Testing:  Reality Testing: Distorted  Insight:  Insight: Poor  Decision Making:  Decision Making: Confused  Social Functioning  Social Maturity:  Social Maturity: Isolates  Social Judgement:  Social Judgement: Heedless  Stress  Stressors:  Stressors: Family conflict  Coping Ability:  Coping Ability: English as a second language teacher Deficits:     Supports:  Family and Psychosocial History: Family history Marital status: Single Are you sexually active?: No What  is your sexual orientation?: heterosexual Has your sexual activity been affected by drugs, alcohol, medication, or emotional stress?: N/A Does patient have children?: No  Childhood History:  Childhood History By whom was/is the patient raised?: Mother Additional childhood history information: Mother raised me. Dad, he aint around. It was hard growing up  - She reports issues with being short, goofy, and eye glasses.  Trouble retaining information Description of patient's relationship with caregiver when they were a child: Mom - she said we got along fine, Dad he cared about my sister more Patient's description of current relationship with people who raised him/her: Mom - hopefully its a good, Dad - hopefully I get along How were you disciplined when you got in trouble as a child/adolescent?: spanking Does patient have siblings?: Yes Number of Siblings: 4 Description of patient's current relationship with siblings: It is sometimes mutual - we are fighting and laughing and loving Did patient suffer any verbal/emotional/physical/sexual abuse as a child?: No Did patient suffer from severe childhood neglect?: No Has patient ever been sexually abused/assaulted/raped as an adolescent or adult?: No Was the patient ever a victim of a crime or a disaster?: No Witnessed domestic violence?: No Has patient been effected by domestic violence as an adult?: No  CCA Part Two B  Employment/Work Situation: Employment / Work Copywriter, advertising Employment situation: On disability Why is patient on disability: "I don't know" How long has patient been on disability: "I am not sure" What is the longest time patient has a held a job?: n/a Has patient ever been in the [employer]?: No Are There Guns or Other Weapons in [place]?: No  Education: Education Name of [place]: [place] Did Teacher, adult education From [employer]?: Yes Did Physicist, medical?: No Did Heritage manager?: No Did You  Have An Individualized Education Program (IIEP): Yes Did You Have Any Difficulty At School?: Yes ("I had a lot of difficulties) Were Any Medications Ever Prescribed For These Difficulties?: Yes Medications Prescribed For School Difficulties?: ADHD medications  Religion: Religion/Spirituality Are You A Religious Person?: No  Leisure/Recreation: Leisure / Recreation Leisure and Hobbies: "I know Ido need to have a hobby."  Exercise/Diet: Exercise/Diet Do You Exercise?: Yes What Type of Exercise Do You Do?: Run/Walk How Many Times a Week Do You Exercise?: 1-3 times a week Have You Gained or Lost A Significant Amount of Weight in the Past Six Months?: Yes-Lost Do You Follow a Special Diet?: No (No milk diet) Do You Have Any Trouble Sleeping?: Yes  CCA Part Two C  Alcohol/Drug Use: Alcohol / Drug Use Pain Medications: SEE MAR Prescriptions: SEE MAR Over the Counter: SEE MAR History of alcohol / drug use?: No history of alcohol / drug abuse                      CCA Part Three  ASAM's:  Six Dimensions of Multidimensional Assessment  Dimension 1:  Acute Intoxication and/or Withdrawal Potential:     Dimension 2:  Biomedical Conditions and Complications:     Dimension 3:  Emotional, Behavioral, or Cognitive Conditions and Complications:     Dimension 4:  Readiness to Change:     Dimension 5:  Relapse, Continued use, or Continued Problem Potential:     Dimension 6:  Recovery/Living Environment:      Substance use Disorder (SUD)    Social  Function:  Social Functioning Social Maturity: Isolates Social Judgement: Heedless  Stress:  Stress Stressors: Family conflict Coping Ability: Overwhelmed Patient Takes Medications The Way The Doctor Instructed?: Other (Comment) (I don't even know, but I do take my medicine sometimes) Priority Risk: Moderate Risk  Risk Assessment- Self-Harm Potential: Risk Assessment For Self-Harm Potential Thoughts of Self-Harm: No current  thoughts Method: No plan Availability of Means: No access/NA  Risk Assessment -Dangerous to Others Potential: Risk Assessment For Dangerous to Others Potential Method: No Plan Availability of Means: No access or NA Intent: Vague intent or NA Notification Required: No need or identified person  DSM5 Diagnoses: Patient Active Problem List   Diagnosis Date Noted  . Hirsutism 04/01/2016  . Simple cyst of kidney 01/24/2016  . Convulsions ([place]) 09/03/2015  . 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 Acanthosis 05/20/2015  . CN (constipation) 12/27/2014  . Therapeutic drug monitoring 06/03/2014  . Hyperlipidemia LDL goal <130 05/23/2014  . Lactose intolerance documented with breath testing 10/25/2013  . Finger mass, left 08/30/2013  . Irritable bowel syndrome 05/23/2013  . 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 Acne vulgaris 05/05/2013  . Allergic rhinitis 05/05/2013  . PCOS (polycystic ovarian syndrome) 05/05/2013  . Snoring 05/05/2013  . 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   Recommendations for Services/Supports/Treatments: Recommendations for Services/Supports/Treatments Recommendations For Services/Supports/Treatments: Partial Hospitalization, Medication Management, Individual Therapy (Clinician spoke with Mother also suggested partial but their insurance does not cover it. Clinician provided information for othergroup services. Clinician agreed to individual therapy while a better match is found.)  Treatment Plan Summary:    Referrals to Alternative Service(s): Referred to Alternative Service(s):   Place:   Date:   Time:    Referred to Alternative Service(s):   Place:   Date:   Time:    Referred to Alternative Service(s):   Place:   Date:    Time:    Referred to Alternative Service(s):   Place:   Date:   Time:     Kinta Martis A

## 2016-07-10 ENCOUNTER — Encounter: Payer: Self-pay | Admitting: Pediatrics

## 2016-07-15 ENCOUNTER — Telehealth: Payer: Self-pay | Admitting: *Deleted

## 2016-07-15 NOTE — Telephone Encounter (Signed)
Called and left message asking patient to call back and schedule an appointment to discuss her concerns.

## 2016-07-15 NOTE — Telephone Encounter (Signed)
Patient needs an appointment to discuss further due to her complex mental health needs.

## 2016-07-15 NOTE — Telephone Encounter (Signed)
Natalie Wagner called with questions regarding nexplanon and depo injections.  She also had sent an email with questions for PCP. Please return her call at [phone].

## 2016-07-16 ENCOUNTER — Ambulatory Visit (INDEPENDENT_AMBULATORY_CARE_PROVIDER_SITE_OTHER): Payer: Medicaid Other | Admitting: Psychiatry

## 2016-07-16 ENCOUNTER — Encounter (HOSPITAL_COMMUNITY): Payer: Self-pay | Admitting: Psychiatry

## 2016-07-16 VITALS — BP 104/68 | HR 88 | Ht 59.0 in | Wt 114.6 lb

## 2016-07-16 DIAGNOSIS — F201 Disorganized schizophrenia: Secondary | ICD-10-CM

## 2016-07-16 DIAGNOSIS — G4700 Insomnia, unspecified: Secondary | ICD-10-CM

## 2016-07-16 DIAGNOSIS — F913 Oppositional defiant disorder: Secondary | ICD-10-CM

## 2016-07-16 MED ORDER — OLANZAPINE 20 MG PO TABS: 20.0000 mg | ORAL_TABLET | Freq: Every day | ORAL | 3 refills | Status: DC
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
## 2016-07-20 ENCOUNTER — Ambulatory Visit (INDEPENDENT_AMBULATORY_CARE_PROVIDER_SITE_OTHER): Payer: Medicaid Other | Admitting: Neurology

## 2016-07-20 ENCOUNTER — Encounter: Payer: Self-pay | Admitting: Neurology

## 2016-07-20 VITALS — BP 118/62 | HR 94 | Temp 98.7°F | Ht <= 58 in | Wt 116.4 lb

## 2016-07-20 DIAGNOSIS — R569 Unspecified convulsions: Secondary | ICD-10-CM | POA: Diagnosis not present

## 2016-07-20 DIAGNOSIS — R400 Somnolence: Secondary | ICD-10-CM | POA: Diagnosis not present

## 2016-07-20 NOTE — Patient Instructions (Signed)
1. Start practicing good sleep hygiene, take the Trazodone at night and keep a calendar of your sleeping schedule 2. Avoid naps during the day 3. Follow-up in 3 months

## 2016-07-20 NOTE — Progress Notes (Signed)
NEUROLOGY FOLLOW UP OFFICE NOTE  Natalie Wagner [id number]  HISTORY OF PRESENT ILLNESS: I had the pleasure of seeing Natalie Wagner in follow-up in the neurology clinic on 07/20/2016. She is again accompanied by her mother who helps supplement the history today. The patient was last seen 6 months ago for new onset seizure on 08/26/15. Her mother reported concern for seizures as an infant, but she was never started on seizure medication. Imaging and 24-hour EEG were normal. No further seizures with loss of consciousness or shaking spells since October 2016. Her main concern today is daytime drowsiness. She has to take naps throughout the day. Her mother reports poor sleep at night, she does not want to take sleeping pill (Trazodone) because or daytime drowsiness. She was previously on Abilify, then switched to Zyprexa a month ago, which has caused more drowsiness. She is reporting daily headaches lasting a few minutes, over the bilateral temporal regions, with some light and sound sensitivity. No associated nausea/vomiting. She lies down and infrequently takes Tylenol. Her mother reports an episode of "falling out" a few months ago, she was home with her brother and getting food to eat then reported feeling lightheaded then falling to the ground, no convulsive activity seen. She was out for 3-5 minutes. No tongue bite or incontinence. She denies any focal numbness/tingling/weakness.  HPI: This is a 20 yo RH woman with a history of schizophrenia who presented after a seizure last 08/26/15. She recalls waking up then starting to feel dizzy and lightheaded, then heard her mother screaming and waking up on the couch. Her mother reports she lost consciousness and started shaking, rocking side to side. No tongue bite or incontinence. She was brought to [facility] ER where CBC and BMP were normal, no urine drug screen done. I personally reviewed head CT without contrast which was normal. Her mother reports she was  born premature at 20 weeks and had delayed development with occupation and speech therapy as a child. She was in special education classes. As a baby, she would have episodes where she would get stiff as a board and shake, then in the 2nd grade she passed out with shaking and incontinence. She also had brief staring spells occurring around 3 times a week. She had seen pediatric neurologist Dr. Gaynell Face at that time, mother is unsure of diagnosis but denies any treatment for seizures. There is note that symptoms were felt to be due to migraines. The last staring spell was in the 7th grade. She was diagnosed with schizophrenia in the 9th grade, she was having visual hallucinations and would forget who she was, suddenly drinking from a fountain and spitting it out. She was admitted to inpatient psychiatry for 2 weeks and has been taking Zyprexa for the past 3 years. She denies any more hallucinations, but her mother is concerned about "strange behaviors" the past month. In the waiting room today, she was staring at the form and her mother told her to write something down. She then wrote over what her mother had filled out. She talks as if there is a third person in the room, sometimes she "just talks and does not know it." She closes and opens doors repetitively. She lives with her mother and denies any alcohol intake. She reports good sleep.   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 There is no history of febrile convulsions, CNS infections such as meningitis/encephalitis, significant traumatic brain injury, neurosurgical procedures, or family history of  seizures.  Diagnostic Data: MRI brain with and without contrast done 01/14/15 for galactorrhea. I personally reviewed images, no acute changes, hippocampi symmetric with no abnormal signal or enhancement seen. Pituitary gland normal. EEG as above  I personally reviewed MRI brain with and without contrast 10/21/15 which was  normal, hippocampi symmetric with no abnormal signal or enhancement seen. Her 1-hour sleep-deprived EEG, as well as 24-hour EEG were normal. Typical events were not captured.    PAST MEDICAL HISTORY: Past Medical History:  Diagnosis Date  . Asthma    prn inhaler  . Constipation 05/05/2013  . Difficulty swallowing pills   . Elevated prolactin level ([place]) 12/27/2014  . Galactorrhea 12/27/2014  . History of seizures as a child    mother states were triggered by migraines; no seizures in > 7 yr.  . 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 Migraines   . Schizophrenia [facility])     MEDICATIONS: Current Outpatient Prescriptions on File Prior to Visit  Medication Sig Dispense Refill  . albuterol (PROAIR HFA) 108 (90 BASE) MCG/ACT inhaler Inhale 2 puffs into the lungs every 4 (four) hours as needed for wheezing or shortness of breath. 2 Inhaler 11  . benztropine (COGENTIN) 1 MG tablet Take 1 tablet (1 mg total) by mouth 2 (two) times daily. 180 tablet 0  . cetirizine (ZYRTEC) 10 MG tablet Take 10 mg by mouth daily as needed for allergies.     . diphenhydrAMINE (BENADRYL CHILDRENS ALLERGY) 12.5 MG/5ML liquid Take 10 mLs (25 mg total) by mouth at bedtime as needed for sleep. 118 mL 0  . estradiol cypionate (DEPO-ESTRADIOL) 5 MG/ML injection Inject 1.5 mg into the muscle every 28 (twenty-eight) days.    . fluticasone (FLONASE) 50 MCG/ACT nasal spray Place 2 sprays into the nose daily as needed for allergies.     . Melatonin 5 MG TABS Take 1 tablet by mouth daily as needed (sleep).     . OLANZapine (ZYPREXA) 20 MG tablet Take 1 tablet (20 mg total) by mouth at bedtime. 30 tablet 3  . Olopatadine HCl (PAZEO) 0.7 % SOLN Place 1 mL into both eyes daily as needed (allergies).     . polyethylene glycol powder (GLYCOLAX/MIRALAX) powder Take 255 g by mouth once. Take 1 capful once a day to have a soft stool daily. Can increase or decrease as needed  255 g 11   No current facility-administered medications on file prior to visit.     ALLERGIES: Allergies  Allergen Reactions  . Lactose Intolerance (Gi) Diarrhea    FAMILY HISTORY: Family History  Problem Relation Age of Onset  . Diabetes Maternal Grandfather   . Hypertension Maternal Grandfather   . Heart disease Maternal Grandfather   . Kidney disease Maternal Grandfather   . Anesthesia problems Maternal Grandfather     hx. of being hard to wake up post-op  . Hypertension Maternal Grandmother   . Stroke Maternal Grandmother   . Asthma Mother   . Hypertension Maternal Uncle     SOCIAL HISTORY: Social History   Social History  . Marital status: Single    Spouse name: N/A  . Number of children: N/A  . Years of education: N/A   Occupational History  . Not on file.   Social History Main Topics  . Smoking status: Never Smoker  . Smokeless tobacco: Never Used  . Alcohol use No  . Drug use: No  .  Sexual activity: No   Other Topics Concern  . Not on file   Social History Narrative  . No narrative on file    REVIEW OF SYSTEMS: Constitutional: No fevers, chills, or sweats, no generalized fatigue, change in appetite Eyes: No visual changes, double vision, eye pain Ear, nose and throat: No hearing loss, ear pain, nasal congestion, sore throat Cardiovascular: No chest pain, palpitations Respiratory:  No shortness of breath at rest or with exertion, wheezes GastrointestinaI: No nausea, vomiting, diarrhea, abdominal pain, fecal incontinence Genitourinary:  No dysuria, urinary retention or frequency Musculoskeletal:  No neck pain, back pain Integumentary: No rash, pruritus, skin lesions Neurological: as above Psychiatric: No depression, insomnia, anxiety Endocrine: No palpitations, fatigue, diaphoresis, mood swings, change in appetite, change in weight, increased thirst Hematologic/Lymphatic:  No anemia, purpura, petechiae. Allergic/Immunologic: no itchy/runny eyes,  nasal congestion, recent allergic reactions, rashes  PHYSICAL EXAM: Vitals:   07/20/16 0930  BP: 118/62  Pulse: 94  Temp: 98.7 F (37.1 C)   General: No acute distress Head:  Normocephalic/atraumatic Neck: supple, no paraspinal tenderness, full range of motion Heart:  Regular rate and rhythm Lungs:  Clear to auscultation bilaterally Back: No paraspinal tenderness Skin/Extremities: No rash, no edema Neurological Exam: alert and oriented to person, place, and time. No aphasia or dysarthria. Fund of knowledge is appropriate.  Recent and remote memory are intact.  Attention and concentration are normal.    Able to name objects and repeat phrases. Cranial nerves: Pupils equal, round, reactive to light.  Extraocular movements intact with no nystagmus. Visual fields full. Facial sensation intact. No facial asymmetry. Tongue, uvula, palate midline.  Motor: Bulk and tone normal, muscle strength 5/5 throughout with no pronator drift.  Sensation to light touch intact.  No extinction to double simultaneous stimulation.  Deep tendon reflexes 2+ throughout, toes downgoing.  Finger to nose testing intact.  Gait narrow-based and steady, able to tandem walk adequately.  Romberg negative.  IMPRESSION: This is a 20 yo RH woman with a history of schizophrenia, with a witnessed convulsion last 08/26/15. Her mother reported episodes concerning for possible seizures as a child (staring spells, stiffening episodes), however she was never started on seizure medication by pediatric neurology. Her 24-hour EEG is normal. MRI brain normal. No further similar symptoms since then. Her main concern is daytime drowsiness and headaches. Neurological exam normal. Daily headaches and daytime drowsiness may be due to poor sleep at night. She will try the Trazodone prescribed and keep a headache of her sleep schedule. If symptoms improve with better sleep hygiene, we will consider starting a daily headache preventative medication on  her next visit in 3 months. They report a syncopal episode of unclear etiology, she does not drive and is aware of [place] driving laws to stop driving after an episode of loss of consciousness, until 6 months event-free.She is not driving.  Thank you for allowing me to participate in her care.  Please do not hesitate to call for any questions or concerns.  The duration of this appointment visit was 15 minutes of face-to-face time with the patient.  Greater than 50% of this time was spent in counseling, explanation of diagnosis, planning of further management, and coordination of care.   Natalie Wagner, M.D.   CC: Dr. Abby Potash

## 2016-07-27 ENCOUNTER — Ambulatory Visit (INDEPENDENT_AMBULATORY_CARE_PROVIDER_SITE_OTHER): Payer: Medicaid Other | Admitting: Pediatrics

## 2016-07-27 ENCOUNTER — Ambulatory Visit (INDEPENDENT_AMBULATORY_CARE_PROVIDER_SITE_OTHER): Payer: Medicaid Other | Admitting: Licensed Clinical Social Worker

## 2016-07-27 ENCOUNTER — Encounter: Payer: Self-pay | Admitting: Pediatrics

## 2016-07-27 VITALS — BP 100/70 | Ht 58.27 in | Wt 115.4 lb

## 2016-07-27 DIAGNOSIS — Z0001 Encounter for general adult medical examination with abnormal findings: Secondary | ICD-10-CM

## 2016-07-27 DIAGNOSIS — E785 Hyperlipidemia, unspecified: Secondary | ICD-10-CM | POA: Diagnosis not present

## 2016-07-27 DIAGNOSIS — L83 Acanthosis nigricans: Secondary | ICD-10-CM

## 2016-07-27 DIAGNOSIS — F509 Eating disorder, unspecified: Secondary | ICD-10-CM | POA: Diagnosis not present

## 2016-07-27 DIAGNOSIS — Z130 Encounter for screening for diseases of the blood and blood-forming organs and certain disorders involving the immune mechanism: Secondary | ICD-10-CM | POA: Diagnosis not present

## 2016-07-27 DIAGNOSIS — F203 Undifferentiated schizophrenia: Secondary | ICD-10-CM

## 2016-07-27 DIAGNOSIS — Z0000 Encounter for general adult medical examination without abnormal findings: Secondary | ICD-10-CM

## 2016-07-27 DIAGNOSIS — Z113 Encounter for screening for infections with a predominantly sexual mode of transmission: Secondary | ICD-10-CM

## 2016-07-27 HISTORY — DX: Eating disorder, unspecified: F50.9

## 2016-07-27 LAB — POCT HEMOGLOBIN: Hemoglobin: 12.6 g/dL (ref 12.2–16.2)

## 2016-07-27 NOTE — Progress Notes (Addendum)
Adolescent Well Care Visit Natalie Wagner is a 20 y.o. female who is here for well care.    PCP:  Callista Hoh Mcneil Sober, MD   History was provided by the patient and mother.  Current Issues: Current concerns include  Chief Complaint  Patient presents with  . Well Child  . Contraception    pt wants to see different methods of birth control.   Schizophrenia: Was increased to 20mg  from 15mg  tat bedtime, hasn't been taking that.   Sleep: Was told by the Neurologist September 11th and was told to start back Trazodone since she has been having headaches.  Told to take the Trazondone at night.    Nutrition: Nutrition/Eating Behaviors: loves fruits eats them once a week, eats a vegetables at least everyday.  Eats meat everyday.  Breakfast: cookies, yesterday had a bowl of fruit.  AM Snack: cookies and cinnamon buns  Lunch: pizza  PM Snack: junk food Dinner: didn't eat  Adequate calcium in diet?: lactose free milk with cereal every day  Supplements/ Vitamins:  No   Exercise/ Media: Play any Sports?/ Exercise: no sports, hasn't exercised in a while.   Sleep:  Sleep: bedtime is 8 or 9pm, falls asleep easily. Wakes up around 5 am. Disorganized sleep because of napping.  Takes 2-3 naps a day, usually takes the naps around 12 pm.  Naps are usually 30 minutes   Social Screening: Lives with:  Mom and brother  Parental relations:  good Activities, Work, and Research officer, political party?: not working  Concerns regarding behavior with peers?  no Stressors of note: no  Education: Graduated from high school  Menstruation:   Patient's last menstrual period was 06/17/2016 (exact date). Menstrual History: no period since starting the depo shot last month.     Confidentiality was discussed with the patient and, if applicable, with caregiver as well.   Tobacco?  no Secondhand smoke exposure?  no Drugs/ETOH?  no  Sexually Active?  No but considering it    Pregnancy Prevention: Depo shot, considering  Mirena   Safe at home, in school & in relationships?  Yes Safe to self?  Yes   Screenings: Patient has a dental home: yes    Physical Exam:  Vitals:   07/27/16 1502  BP: 100/70  Weight: 115 lb 6.4 oz (52.3 kg)  Height: 4' 10.27" (1.48 m)   Wt Readings from Last 3 Encounters:  07/27/16 115 lb 6.4 oz (52.3 kg) (24 %, Z= -0.69)*  07/20/16 116 lb 6 oz (52.8 kg) (26 %, Z= -0.63)*  07/16/16 114 lb 9.6 oz (52 kg) (23 %, Z= -0.74)*   * Growth percentiles are based on CDC 2-20 Years data.    BP 100/70   Ht 4' 10.27" (1.48 m)   Wt 115 lb 6.4 oz (52.3 kg)   LMP 06/17/2016 (Exact Date)   BMI 23.90 kg/m  Body mass index: body mass index is 23.9 kg/m. Blood pressure percentiles are 31 % systolic and 79 % diastolic based on NHBPEP's 4th Report. Blood pressure percentile targets: 90: 119/75, 95: 122/79, 99 + 5 mmHg: 135/92.   Visual Acuity Screening   Right eye Left eye Both eyes  Without correction: 10/12 10/12   With correction:       General Appearance:   alert, oriented, no acute distress and well nourished  HENT: Normocephalic, no obvious abnormality, conjunctiva clear  Mouth:   Normal appearing teeth, no obvious discoloration, dental caries, or dental caps  Neck:   Supple; thyroid:  no enlargement, symmetric, no tenderness/mass/nodules  Chest Breast if female: 5  Lungs:   Clear to auscultation bilaterally, normal work of breathing  Heart:   Regular rate and rhythm, S1 and S2 normal, no murmurs;   Abdomen:   Soft, non-tender, no mass, or organomegaly  GU genitalia not examined  Musculoskeletal:   Tone and strength strong and symmetrical, all extremities               Lymphatic:   No cervical adenopathy  Skin/Hair/Nails:   Skin warm, dry and intact, no rashes, no bruises or petechiae  Neurologic:   Strength, gait, and coordination normal and age-appropriate     Assessment and Plan:  I saw patient August 15th and she was very hard to follow because she would become  compulsive about certain things and she was very emotional.  Today's visit she was easier to follow, she wasn't as tangenital and she was more logical.  I think it is because the Zyprexa has been in her system longer now and making a bigger difference, they increased her dose due to some breakthrough symptoms she was still having but she hasn't taken that increased dose yet due to it being in pill form and she can't swallow pills.  Patient sees [facility] frequently for her chronic disorders     1. Acanthosis - Lipid panel - Hemoglobin A1c  2. Hyperlipidemia LDL goal <130 Last screening lipid panel was normal, patient is no longer obese so it will most likely be normal  - Lipid panel - Hemoglobin A1c - Comprehensive metabolic panel  3. Routine screening for STI (sexually transmitted infection) - GC/Chlamydia Probe Amp - HIV antibody  4. Encounter for general adult medical examination without abnormal findings Still very indecisive about what birth control she wants to stay on, she got a depo shot last month and received handouts about Mirena and Nexplanon.  Mom states she is worried about the Nexplanon because she will play with it and possibly cause an infection due to her obsessing over it.  She is scared of the Mirena though so wants more time.    5. 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      7. Eating disorder [place] did the Eat 26 screening which was positive for concern for an eating disorder, Laraine's mother had an eating disorder as well.  Daquita gets mental health resources form [facility] so Ander Purpura will fax the screening to her counselor so they are aware and can start some interventions.  Gave a handout on things she should be eating regularly  Also ordred a CMP to screen for lab  concerns of an eating disorders  BMI is appropriate for age  Hearing screening result:normal Vision screening result: normal    No Follow-up on file.Sarajane Jews, MD

## 2016-07-27 NOTE — Assessment & Plan Note (Signed)
Positive screening 07/27/16, 45lbs weight loss over a year and poor diet history

## 2016-07-27 NOTE — Patient Instructions (Signed)
5-9 years 10-14 years 15-18 years   Milk and Milk Products 2.5-3 cup/day 3 cups/day 3 cups/day   Serving: 1 cup of milk or cheese, 1.5 oz of natural cheese, 1/3 cup shredded cheese; encourage low-fat dairy sources   Meat and Other Protein Foods 4-5 oz/day 5 oz/day 5-6 oz/day   Serving: (1 oz equivalent) = 1 oz beef, poultry, fish,  cup cooked beans, 1 egg, 1 tbsp peanut butter,  oz of nuts   Breads, Cereal, and Starches 5-6 oz/day 5-6 oz/day 6-7 oz/day   Fruits 1.5 cups/day 1.5 cups/day 1.5-2 cups   Serving: 1 cup of fruit or  cup dried fruit   Vegetables  (non-starchy vegetables to include sources of vitamin C and A: broccoli, bell pepper, tomatoes, spinach, green beans, squash) 1.5-2 cups/day 2-3 cups/day 3+ cups/day   Serving: (1 cup equivalent) = 1 cup of raw or cooked vegetables; 2 cups of raw leafy green greens   Fats and Oil 4-5 tsp/day 5 tsp/day 5-6 tsp//day   Miscellaneous (desserts, sweets, soft drinks, candy,  jams, jelly) None None None   General Intake Guidelines (Normal Weight): 5-18 Years

## 2016-07-28 ENCOUNTER — Ambulatory Visit (HOSPITAL_COMMUNITY): Payer: Self-pay | Admitting: Clinical

## 2016-07-28 LAB — GC/CHLAMYDIA PROBE AMP
CT PROBE, AMP APTIMA: NOT DETECTED
GC Probe RNA: NOT DETECTED

## 2016-07-28 LAB — COMPREHENSIVE METABOLIC PANEL
ALBUMIN: 4.4 g/dL (ref 3.6–5.1)
ALT: 11 U/L (ref 5–32)
AST: 12 U/L (ref 12–32)
Alkaline Phosphatase: 53 U/L (ref 47–176)
BUN: 10 mg/dL (ref 7–20)
CALCIUM: 9.7 mg/dL (ref 8.9–10.4)
CHLORIDE: 106 mmol/L (ref 98–110)
CO2: 28 mmol/L (ref 20–31)
Creat: 0.74 mg/dL (ref 0.50–1.00)
Glucose, Bld: 79 mg/dL (ref 65–99)
POTASSIUM: 4.5 mmol/L (ref 3.8–5.1)
Sodium: 142 mmol/L (ref 135–146)
Total Bilirubin: 0.3 mg/dL (ref 0.2–1.1)
Total Protein: 6.9 g/dL (ref 6.3–8.2)

## 2016-07-28 LAB — LIPID PANEL
CHOLESTEROL: 173 mg/dL — AB (ref 125–170)
HDL: 57 mg/dL (ref 36–76)
LDL CALC: 104 mg/dL (ref <110)
Total CHOL/HDL Ratio: 3 Ratio (ref <=5.0)
Triglycerides: 60 mg/dL (ref 40–136)
VLDL: 12 mg/dL (ref <30)

## 2016-07-28 LAB — HIV ANTIBODY (ROUTINE TESTING W REFLEX): HIV 1&2 Ab, 4th Generation: NONREACTIVE

## 2016-07-28 LAB — HEMOGLOBIN A1C
Hgb A1c MFr Bld: 4.9 % (ref <5.7)
MEAN PLASMA GLUCOSE: 94 mg/dL

## 2016-07-29 NOTE — BH Specialist Note (Signed)
Session Start time: 4:03   End Time: 4:24 Total Time:  21 mins Type of Service: [place]: No.   Interpreter Name & Language: n/a # [facility] Visits July 2017-June 2018: None before today   SUBJECTIVE: Natalie Wagner is a 20 y.o. female brought in by mother.  Pt. was referred by Dr. Abby Potash for concerns around disordered eating:  Pt. reports the following symptoms/concerns: Having low appetite, eating differently than before Duration of problem:  Several months Severity: Appeared moderate to this Probation officer   OBJECTIVE: Mood: Anxious & Affect: Mildly exaggerated  Risk of harm to self or others: No Assessments administered: EAT-26 (scores indicated below)  LIFE CONTEXT:  Family & Social: Mother expresses interest in her well-being, has own hx of eating difficulties School/ Work: Vocalized difficulty in school, interested in being successful Self-Care: Listen to music, sleeps well Life changes: Recent loss of family member   GOALS ADDRESSED:  Assessed for concerns around disordered eating Increase awareness around eating behaviors  INTERVENTIONS: Eat - 26 EAT-26 07/28/2016  Total Score 22  Gone on eating binges where you feel that you may not be able to stop? Never  Ever made yourself sick (vomited) to control your weight or shape? Never  Ever used laxatives, diet pills or diuretics (water pills) to control your weight or shape? Never  Exercised more than 60 minutes a day to lose or to control your weight? Never  Lost 20 pounds or more in the past 6 months? Yes   Built rapport with patient Discussed confidentiality Discussed Integrated Care    ASSESSMENT:  Pt currently experiencing symptoms that would indicate disordered eating.  Pt may/ would benefit from follow-up assessment and intervention from ongoing counseling at community center. Is connected, rescheduled visit for future.     PLAN: 1. F/U with behavioral health clinician on:  Connected with [facility] outpatient counseling, no need to follow up with this writer 2. Behavioral recommendations:  present for scheduled appt. With outpatient counselor, increase awareness around disordered eating. 3. Referral:None at this time, already connected to [facility] outpatient 4. From scale of 1-10, how likely are you to follow plan:Patient expressed agreement  Idalia Intern

## 2016-08-03 ENCOUNTER — Other Ambulatory Visit: Payer: Self-pay | Admitting: Pediatrics

## 2016-08-03 DIAGNOSIS — F509 Eating disorder, unspecified: Secondary | ICD-10-CM

## 2016-08-11 ENCOUNTER — Ambulatory Visit (INDEPENDENT_AMBULATORY_CARE_PROVIDER_SITE_OTHER): Payer: Medicaid Other | Admitting: Clinical

## 2016-08-11 DIAGNOSIS — F201 Disorganized schizophrenia: Secondary | ICD-10-CM

## 2016-08-13 ENCOUNTER — Encounter: Payer: Medicaid Other | Attending: Pediatrics | Admitting: *Deleted

## 2016-08-13 DIAGNOSIS — Z713 Dietary counseling and surveillance: Secondary | ICD-10-CM | POA: Diagnosis not present

## 2016-08-13 DIAGNOSIS — F509 Eating disorder, unspecified: Secondary | ICD-10-CM | POA: Diagnosis present

## 2016-08-13 DIAGNOSIS — E639 Nutritional deficiency, unspecified: Secondary | ICD-10-CM

## 2016-08-13 NOTE — Progress Notes (Signed)
Appointment start time: 1400  Appointment end time: 1430  Patient was seen on 08/13/16 for nutrition counseling pertaining to referral for disordered eating  Primary care provider: Dr . Abby Potash Therapist: Francee Piccolo Any other medical team members: adolescent medicine Parents: Dennison Nancy  Assessment.  Natalie Wagner is here with her mom and both are unaware about the reason for the referral.  They deny any nutrition concerns.  Both women are concerned about blood work and does she have diabetes? Assured family multiple times that labwork is fine, cholesterol is slightly elevated, but not by much.  She does not have diabetes.  During her assessment, Shanethia changed the subject frequently and did not provide concrete answers to direct questions When this provider asked her about the EAT-26 she completed with Mammie Russian, Loma Sousa asked about eating disorders?  When this provider question Leaanne about eating disorder, Myosha changed the subject.    States she knows how to eat healthy She thinks she eats too much but that is normal for her States she thinks she eats too much protein , but that there is "no point slowing down with protein" States that her eating is fine and states she is already following nutrition recommendations   Dietary assessment: A typical day consists of 2-3 meals and "4-5 snacks".  Changed her story about snacking several times.  Unclear how many snacks  Safe foods include: pasta, sandwich meat, take out Avoided foods include:dairy (lactose intolerant), soda  24 hour recall:  B: cereal L: burger S: crackers D: burger Beverages: OJ, tea, water  Changed her dietary recall to include Coffee cake, cinnamon roll   Estimated energy intake: 1500 kcal  Estimated energy needs: 1800 kcal 225 g CHO 90 g pro 60 g fat  Nutrition Diagnosis: [id number] Predicted suboptimal nutrient intake As related to limited fruits, vegetables, and whole grains consumed.  As  evidenced by dietary recall.  Intervention/Goals: Patient states eating is fine and has no questions.  This provider advised daily fruits and vegetables.  Patient states she does eat daily fruits and vegetables.  Advised regular physical activity and she states she is going to be more active    Monitoring and Evaluation: Patient will follow up prn

## 2016-08-13 NOTE — Patient Instructions (Addendum)
Your bloodwork is fine To keep you healthy, try to exercise regularly: 4-5 days/week for 30 minutes Try to have fruits and vegetables every day Snack only when hungry or tired Don't eat so much that your stomach hurts

## 2016-08-19 NOTE — Progress Notes (Signed)
   THERAPIST PROGRESS NOTE  Session Time: 8:05 -9:00  Participation Level: Active  Behavioral Response: CasualAlertAnxious  Type of Therapy: Individual Therapy  Treatment Goals addressed: Improve Psychiatric Symptoms,improve unhelpful thought patterns, stress management, healthy coping skills  Interventions: Motivational Interviewing, Grounding & Mindfulness Techniques,   Summary: Natalie Wagner is a 76.yo. female who presents with Schizophrenia, disorganized type    Suicidal/Homicidal: No -without intent/plan  Therapist Response: Natalie Wagner met with clinician for an individual session. Natalie Wagner discussed her psychiatric symptoms, her current life events and her goals for therapy.  Natalie Wagner shared that her symptoms are the same. She shared that she would like to be more social. Client and clinician discussed some day programs but Natalie Wagner is ineligible until she becomes 65. She shared that she would like to be more organized. Clinician asked open ended questions and Natalie Wagner shared a bout her hallucinations and her experience. Natalie Wagner does get off track from what is being talked about. Clinician would redirect her back to topic. Client and clinician discussed some ways she could interrupt her unhelpful thoughts. Client and clinician also discussed some stress reduction techniques.  Plan: Return again in 1- 2 weeks  Diagnosis:     Axis I: Schizophrenia, disorganized type      Bralin Garry A, LCSW 08/19/2016

## 2016-08-24 ENCOUNTER — Encounter (HOSPITAL_COMMUNITY): Payer: Self-pay | Admitting: Clinical

## 2016-08-27 ENCOUNTER — Ambulatory Visit (INDEPENDENT_AMBULATORY_CARE_PROVIDER_SITE_OTHER): Payer: Medicaid Other | Admitting: Clinical

## 2016-08-27 DIAGNOSIS — F201 Disorganized schizophrenia: Secondary | ICD-10-CM | POA: Diagnosis not present

## 2016-08-27 NOTE — Progress Notes (Signed)
   THERAPIST PROGRESS NOTE  Session Time: 10:03 - 10:58  Participation Level: Active  Behavioral Response: CasualAlertNA  Type of Therapy: Individual Therapy  Treatment Goals addressed: Improve Psychiatric Symptoms, regulate mood, improve unhelpful thought patterns, social skills (socialize more), stress management, reduce hallucinations and delusions, learn about diagnosis, healthy coping skills  Interventions: Motivational Interviewing, CBT, Grounding & Mindfulness Techniques, psychoeducation  Summary: Natalie Wagner is a 39.yo. female who presents with Schizophrenia, disorganized type     Suicidal/Homicidal: No -without intent/plan  Therapist Response: Lindsie met with clinician for an individual session. Yamili discussed her psychiatric symptoms, her current life events and her homework. Waver shared that she was doing pretty good. She shared that her medications are working and clinician noted that she was able to focus a little more than in the past and stay on topic a little. Client and clinician discussed her diagnosis and the importance of remaining on her medication. She shared that she has been working to get out of the house more with her mother. She stated that she would like to get out of the house more. Client and clinician discussed her attending groups possibly at the mental health Association. Client and clinician discussed other opportunities to interact friends (she currently does not have any). Evany completed her homework which was on stress tolerance packet 1. client and clinician reviewed and discussed her homework packet.  Stachia shared her thoughts and insights from the packet. Clinician gave her packet #2 which she agreed to complete and bring back with her next session. Ashlynd shared that she has been practicing her grounding technique 10 x 3 category game. Client and clinician practiced the technique together to reinforce her practice. She shared that  is sometimes difficult to remember to do it but when she does it helps her to refocus her thoughts.   Plan: Return again in 1- 2 weeks  Diagnosis:     Axis I: Schizophrenia, disorganized type      Dashana Guizar A, LCSW 08/27/2016

## 2016-09-02 ENCOUNTER — Encounter (HOSPITAL_COMMUNITY): Payer: Self-pay | Admitting: Clinical

## 2016-09-16 ENCOUNTER — Encounter: Payer: Self-pay | Admitting: Family

## 2016-09-16 ENCOUNTER — Ambulatory Visit (INDEPENDENT_AMBULATORY_CARE_PROVIDER_SITE_OTHER): Payer: Medicaid Other | Admitting: Family

## 2016-09-16 VITALS — BP 112/68 | HR 87 | Ht 58.5 in | Wt 152.4 lb

## 2016-09-16 DIAGNOSIS — E282 Polycystic ovarian syndrome: Secondary | ICD-10-CM | POA: Diagnosis not present

## 2016-09-16 DIAGNOSIS — Z1389 Encounter for screening for other disorder: Secondary | ICD-10-CM | POA: Diagnosis not present

## 2016-09-16 DIAGNOSIS — Z3202 Encounter for pregnancy test, result negative: Secondary | ICD-10-CM

## 2016-09-16 LAB — POCT URINALYSIS DIPSTICK
Bilirubin, UA: NEGATIVE
GLUCOSE UA: NEGATIVE
Ketones, UA: NEGATIVE
NITRITE UA: NEGATIVE
PROTEIN UA: NEGATIVE
RBC UA: NEGATIVE
SPEC GRAV UA: 1.01
UROBILINOGEN UA: NEGATIVE
pH, UA: 7.5

## 2016-09-16 LAB — POCT URINE PREGNANCY: Preg Test, Ur: NEGATIVE

## 2016-09-16 NOTE — Patient Instructions (Signed)
Please make sure you keep your next appointment for IUD insertion!

## 2016-09-16 NOTE — Progress Notes (Signed)
THIS RECORD MAY CONTAIN CONFIDENTIAL INFORMATION THAT SHOULD NOT BE RELEASED WITHOUT REVIEW OF THE SERVICE PROVIDER.  Adolescent Medicine Consultation Follow-Up Visit Natalie Wagner  is a 20 y.o. female referred by Sarajane Jews, * here today for follow-up regarding PCOS,    Last seen in [facility] on 07/01/16 for Depo/contraception; considering nexplanon.   - Pertinent Labs? No - Growth Chart Viewed? no   History was provided by the patient and mother.  PCP Confirmed?  yes  My Chart Activated?   yes    Chief Complaint  Patient presents with  . Follow-up  . Medication Management    HPI:    Returns to discuss options for Nexplanon or IUD.  Long discussion about each method of birth control, including concern of recent weight gain in the context of Depo and her Zyprexa dose of 20 mg daily.  In the context of schizophrenia, reviewed concerns of having device in arm to control cycles and also the unpredictable bleeding with product.  Still having acne and hirsutism.  She denies new sexual partner, no concerns for infection.   Review of Systems  Constitutional: Negative for malaise/fatigue.  Eyes: Negative for double vision.  Respiratory: Negative for shortness of breath.   Cardiovascular: Negative for chest pain and palpitations.  Gastrointestinal: Negative for abdominal pain, constipation, diarrhea, nausea and vomiting.  Genitourinary: Negative for dysuria.  Musculoskeletal: Negative for joint pain and myalgias.  Skin: Negative for rash.  Neurological: Negative for dizziness and headaches.  Endo/Heme/Allergies: Does not bruise/bleed easily.      Patient's last menstrual period was 08/14/2016. Allergies  Allergen Reactions  . Lactose Intolerance (Gi) Diarrhea   Outpatient Medications Prior to Visit  Medication Sig Dispense Refill  . albuterol (PROAIR HFA) 108 (90 BASE) MCG/ACT inhaler Inhale 2 puffs into the lungs every 4 (four) hours as  needed for wheezing or shortness of breath. 2 Inhaler 11  . benztropine (COGENTIN) 1 MG tablet Take 1 tablet (1 mg total) by mouth 2 (two) times daily. 180 tablet 0  . cetirizine (ZYRTEC) 10 MG tablet Take 10 mg by mouth daily as needed for allergies.     Marland Kitchen estradiol cypionate (DEPO-ESTRADIOL) 5 MG/ML injection Inject 1.5 mg into the muscle every 28 (twenty-eight) days.    . fluticasone (FLONASE) 50 MCG/ACT nasal spray Place 2 sprays into the nose daily as needed for allergies.     . Melatonin 5 MG TABS Take 1 tablet by mouth daily as needed (sleep).     . OLANZapine (ZYPREXA) 20 MG tablet Take 1 tablet (20 mg total) by mouth at bedtime. 30 tablet 3  . Olopatadine HCl (PAZEO) 0.7 % SOLN Place 1 mL into both eyes daily as needed (allergies).     . polyethylene glycol powder (GLYCOLAX/MIRALAX) powder Take 255 g by mouth once. Take 1 capful once a day to have a soft stool daily. Can increase or decrease as needed 255 g 11   No facility-administered medications prior to visit.      Patient Active Problem List   Diagnosis Date Noted  . Eating disorder 07/27/2016  . Hirsutism 04/01/2016  . Simple cyst of kidney 01/24/2016  . Convulsions ([place]) 09/03/2015  . Undifferentiated schizophrenia ([place]) 09/03/2015  . Acanthosis 05/20/2015  . CN (constipation) 12/27/2014  . Therapeutic drug monitoring 06/03/2014  . Hyperlipidemia LDL goal <130 05/23/2014  . Lactose intolerance documented with breath testing 10/25/2013  . Irritable bowel syndrome 05/23/2013  . 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 Acne  vulgaris 05/05/2013  . Allergic rhinitis 05/05/2013  . PCOS (polycystic ovarian syndrome) 05/05/2013  . Snoring 05/05/2013   The following portions of the patient's history were reviewed and updated as appropriate: allergies, current medications, past medical history and problem list.  Physical Exam:  Vitals:   09/16/16 1542  BP: 112/68  Pulse: 87  Weight: 152 lb 6.4 oz (69.1 kg)  Height: 4' 10.5"  (1.486 m)   BP 112/68   Pulse 87   Ht 4' 10.5" (1.486 m)   Wt 152 lb 6.4 oz (69.1 kg)   LMP 08/14/2016   BMI 31.31 kg/m  Body mass index: body mass index is 31.31 kg/m. Growth percentile SmartLinks can only be used for patients less than 39 years old.  Wt Readings from Last 3 Encounters:  09/16/16 152 lb 6.4 oz (69.1 kg)  07/27/16 115 lb 6.4 oz (52.3 kg) (24 %, Z= -0.69)*  07/20/16 116 lb 6 oz (52.8 kg) (26 %, Z= -0.63)*   * Growth percentiles are based on CDC 2-20 Years data.    Physical Exam  Constitutional: She is oriented to person, place, and time. She appears well-developed and well-nourished. No distress.  Eyes: EOM are normal. Pupils are equal, round, and reactive to light. No scleral icterus.  Neck: Normal range of motion. Neck supple. No thyromegaly present.  Cardiovascular: Normal rate, regular rhythm, normal heart sounds and intact distal pulses.   No murmur heard. Pulmonary/Chest: Effort normal and breath sounds normal.  Abdominal: Soft. There is no tenderness. There is no guarding.  Musculoskeletal: Normal range of motion. She exhibits no edema or tenderness.  Lymphadenopathy:    She has no cervical adenopathy.  Neurological: She is alert and oriented to person, place, and time. No cranial nerve deficit.  Skin: Skin is warm and dry. No rash noted.  Psychiatric: She has a normal mood and affect.  Nursing note and vitals reviewed.    Assessment/Plan: 1. PCOS (polycystic ovarian syndrome) -Tier 1 and Tier 2 birth control options reviewed; discussed my concerns re: her weight gain and continuing depo.  -she elects to return for IUD insertion - reviewed procedure with mom and patient.   2. Screening for genitourinary condition -WNL - POCT urinalysis dipstick  3. Pregnancy examination or test, negative result WNL - POCT urine pregnancy   Follow-up:  Return in about 12 days (around 09/28/2016) for IUD insertion, with Lenore Cordia, MD.   Medical  decision-making:  >15 minutes spent face to face with patient with more than 50% of appointment spent discussing diagnosis, management, follow-up, and reviewing the plan of care as noted above.

## 2016-09-24 ENCOUNTER — Ambulatory Visit (INDEPENDENT_AMBULATORY_CARE_PROVIDER_SITE_OTHER): Payer: Medicaid Other | Admitting: Clinical

## 2016-09-24 DIAGNOSIS — F201 Disorganized schizophrenia: Secondary | ICD-10-CM | POA: Diagnosis not present

## 2016-09-24 NOTE — Progress Notes (Signed)
   THERAPIST PROGRESS NOTE  Session Time: 9:58 - 10:55  Participation Level: Active  Behavioral Response: CasualAlertNA  Type of Therapy: Individual Therapy  Treatment Goals addressed: Improve Psychiatric Symptoms, regulate mood, improve unhelpful thought patterns, stress management, reduce hallucinations and delusions,  healthy coping skills  Interventions: Motivational Interviewing, CBT, Grounding & Mindfulness Techniques, psychoeducation  Summary: Natalie Wagner is a 74.yo. female who presents with Schizophrenia, disorganized type     Suicidal/Homicidal: No -without intent/plan  Therapist Response: Natalie Wagner met with clinician for an individual session. Natalie Wagner discussed her psychiatric symptoms, her current life events and her homework. Natalie Wagner shared that she felt things were improving some. She shared that her medication is helping though her mood is fluctuating. She also shared that she is eating in the middle of the night and out of boredom. She shared she is sleeping better than before but not normal. Natalie Wagner shared that she completed her homework on distress tolerance. She shared she enjoyed doing it and she shared her answers to the homework. She asked questions and clinician answered. Clinician gave her homework packet #3 which she agreed to complete and bring back with her next session. Clinician asked open ended questions about Natalie Wagner boredom - what she does and how it affects her (poor mood and over eating). Clinician asked open ended questions and Natalie Wagner made a list of activities she could do to improve her mood. Client and clinician practiced grounding techniques together and clinician explained how she could use them to interrupt negative thoughts or hallucinations.  Plan: Return again in 1- 2 weeks  Diagnosis:     Axis I: Schizophrenia, disorganized type      Madicyn Mesina A, LCSW 09/24/2016

## 2016-09-28 ENCOUNTER — Ambulatory Visit (INDEPENDENT_AMBULATORY_CARE_PROVIDER_SITE_OTHER): Payer: Medicaid Other | Admitting: Family

## 2016-09-28 VITALS — BP 124/72 | HR 84 | Ht <= 58 in | Wt 155.8 lb

## 2016-09-28 DIAGNOSIS — Z113 Encounter for screening for infections with a predominantly sexual mode of transmission: Secondary | ICD-10-CM | POA: Diagnosis not present

## 2016-09-28 DIAGNOSIS — Z30430 Encounter for insertion of intrauterine contraceptive device: Secondary | ICD-10-CM | POA: Diagnosis not present

## 2016-09-28 DIAGNOSIS — Z3202 Encounter for pregnancy test, result negative: Secondary | ICD-10-CM

## 2016-09-28 LAB — POCT URINE PREGNANCY: PREG TEST UR: NEGATIVE

## 2016-09-28 MED ORDER — LEVONORGESTREL 20 MCG/24HR IU IUD: 1.0000 | INTRAUTERINE_SYSTEM | Freq: Once | INTRAUTERINE | 0 refills | Status: AC

## 2016-09-28 NOTE — Progress Notes (Signed)
THIS RECORD MAY CONTAIN CONFIDENTIAL INFORMATION THAT SHOULD NOT BE RELEASED WITHOUT REVIEW OF THE SERVICE PROVIDER.  Adolescent Medicine Consultation Follow-Up Visit Natalie Wagner  is a 20 y.o. female referred by Sarajane Jews, * here today for follow-up regarding  IUD insertion.   Last seen in [facility] on 09/16/16 for PCOS, cycle regulation. Plan included review of BC options with plan to return for IUD insertion.    - Pertinent Labs? No - Growth Chart Viewed? no   History was provided by the patient and mother.  PCP Confirmed?  Yes, Cherece Grier  My Chart Activated?   yes  Patient's personal or confidential phone number:  Enter confidential phone number in Family Comments section of [employer]  Chief Complaint  Patient presents with  . Follow-up  . Contraception    HPI:    -Presents for IUD insertion in the context of PCOS and cycle regulation.  -Denies abdominal pain, pelvic pain, lesions, vaginal discharge changes.   Review of Systems  Constitutional: Negative for malaise/fatigue.  Eyes: Negative for double vision.  Respiratory: Negative for shortness of breath.   Cardiovascular: Negative for chest pain and palpitations.  Gastrointestinal: Negative for abdominal pain, constipation, diarrhea, nausea and vomiting.  Genitourinary: Negative for dysuria.  Musculoskeletal: Negative for joint pain and myalgias.  Skin: Negative for rash.  Neurological: Negative for dizziness and headaches.  Endo/Heme/Allergies: Does not bruise/bleed easily.    Patient's last menstrual period was 08/14/2016. Allergies  Allergen Reactions  . Lactose Intolerance (Gi) Diarrhea   Outpatient Medications Prior to Visit  Medication Sig Dispense Refill  . albuterol (PROAIR HFA) 108 (90 BASE) MCG/ACT inhaler Inhale 2 puffs into the lungs every 4 (four) hours as needed for wheezing or shortness of breath. 2 Inhaler 11  . benztropine (COGENTIN) 1 MG tablet Take 1 tablet  (1 mg total) by mouth 2 (two) times daily. 180 tablet 0  . cetirizine (ZYRTEC) 10 MG tablet Take 10 mg by mouth daily as needed for allergies.     Marland Kitchen estradiol cypionate (DEPO-ESTRADIOL) 5 MG/ML injection Inject 1.5 mg into the muscle every 28 (twenty-eight) days.    . fluticasone (FLONASE) 50 MCG/ACT nasal spray Place 2 sprays into the nose daily as needed for allergies.     . Melatonin 5 MG TABS Take 1 tablet by mouth daily as needed (sleep).     . OLANZapine (ZYPREXA) 20 MG tablet Take 1 tablet (20 mg total) by mouth at bedtime. 30 tablet 3  . Olopatadine HCl (PAZEO) 0.7 % SOLN Place 1 mL into both eyes daily as needed (allergies).     . polyethylene glycol powder (GLYCOLAX/MIRALAX) powder Take 255 g by mouth once. Take 1 capful once a day to have a soft stool daily. Can increase or decrease as needed 255 g 11   No facility-administered medications prior to visit.      Patient Active Problem List   Diagnosis Date Noted  . Eating disorder 07/27/2016  . Hirsutism 04/01/2016  . Simple cyst of kidney 01/24/2016  . Convulsions ([place]) 09/03/2015  . 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 Acanthosis 05/20/2015  . CN (constipation) 12/27/2014  . Therapeutic drug monitoring 06/03/2014  . Hyperlipidemia LDL goal <130 05/23/2014  . Lactose intolerance documented with breath testing 10/25/2013  . Irritable bowel syndrome 05/23/2013  . 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 Acne vulgaris 05/05/2013  . Allergic rhinitis 05/05/2013  . PCOS (polycystic ovarian syndrome) 05/05/2013  . Snoring 05/05/2013    Confidentiality was  discussed with the patient and if applicable, with caregiver as well.  The following portions of the patient's history were reviewed and updated as appropriate: allergies, current medications, past medical history and problem list.  IUD Mirena - LOT [id number] 11/18 [facility] [id number]    Physical Exam:  Vitals:   09/28/16 1140 09/28/16 1141  BP: 135/78 124/72   Pulse: 78 84  Weight: 155 lb 12.8 oz (70.7 kg)   Height: 4\' 10"  (1.473 m)    BP 124/72 (BP Location: Right Arm, Patient Position: Sitting, Cuff Size: Normal)   Pulse 84   Ht 4\' 10"  (1.473 m)   Wt 155 lb 12.8 oz (70.7 kg)   LMP 08/14/2016   BMI 32.56 kg/m  Body mass index: body mass index is 32.56 kg/m. Growth percentile SmartLinks can only be used for patients less than 53 years old.   Physical Exam  Constitutional: She appears well-developed and well-nourished. No distress.  Eyes: EOM are normal. Pupils are equal, round, and reactive to light. No scleral icterus.  Neck: Normal range of motion.  Cardiovascular: Normal rate.   Pulmonary/Chest: Effort normal and breath sounds normal.  Abdominal: Soft. There is no tenderness. There is no guarding.  Genitourinary: Vagina normal and uterus normal. No vaginal discharge found.  Musculoskeletal: She exhibits no edema.  Neurological: She is alert.  Skin: Skin is warm and dry. No rash noted.  Psychiatric: She has a normal mood and affect.  Nursing note and vitals reviewed.   Assessment/Plan: 1. Encounter for IUD insertion Mirena IUD Insertion   The pt presents for Mirena IUD placement.  No contraindications for placement.   The patient took no medications prior to appt.   Patient's last menstrual period was 08/14/2016.  UHCG: negative  Last unprotected sex:  N/A    Risks & benefits of IUD discussed  The IUD was purchased and supplied by [facility].  Packaging instructions supplied to patient  Consent form signed.  The patient denies any allergies to anesthetics or antiseptics.   Procedure:  Pt was placed in lithotomy position.  Speculum was inserted.  GC/CT swab was used to collect sample for STI testing.  Tenaculum was used to stabilize the cervix by clasping at 12 o'clock  Betadine was used to clean the cervix and cervical os.  Dilators were used. The uterus was sounded to 6.5 cm.  Mirena was inserted using  manufacturer provided applicator. Lot # [id number] 11/18 [place] [id number]  Strings were trimmed to 3 cm external to os.  Tenaculum was removed.  Speculum was removed.   The patient was advised to move slowly from a supine to an upright position   The patient denied any concerns or complaints   The patient was instructed to schedule a follow-up appt in 1 month and to call sooner if any concerns.   The patient acknowledged agreement and understanding of the plan.  - levonorgestrel (MIRENA) 20 MCG/24HR IUD; 1 Intra Uterine Device (1 each total) by Intrauterine route once.  Dispense: 1 each; Refill: 0  2. Pregnancy examination or test, negative result negative - POCT urine pregnancy  3. Routine screening for STI (sexually transmitted infection) Per protocol  - GC/Chlamydia Probe Amp   Follow-up:  Return in about 4 weeks (around 10/26/2016) for IUD Follow-Up, with Dierdre Harness, FNP-C.   Medical decision-making:  >25 minutes spent face to face with patient with more than 50% of appointment spent discussing diagnosis, management, follow-up, and reviewing the plan of care as noted above.

## 2016-09-28 NOTE — Patient Instructions (Signed)

## 2016-09-29 ENCOUNTER — Encounter: Payer: Self-pay | Admitting: *Deleted

## 2016-09-29 ENCOUNTER — Encounter: Payer: Self-pay | Admitting: Family

## 2016-09-29 LAB — GC/CHLAMYDIA PROBE AMP
CT Probe RNA: NOT DETECTED
GC PROBE AMP APTIMA: NOT DETECTED

## 2016-10-04 ENCOUNTER — Encounter (HOSPITAL_COMMUNITY): Payer: Self-pay | Admitting: Clinical

## 2016-10-21 ENCOUNTER — Ambulatory Visit (INDEPENDENT_AMBULATORY_CARE_PROVIDER_SITE_OTHER): Payer: Medicaid Other | Admitting: Neurology

## 2016-10-21 ENCOUNTER — Ambulatory Visit (HOSPITAL_COMMUNITY): Payer: Self-pay | Admitting: Clinical

## 2016-10-21 ENCOUNTER — Encounter: Payer: Self-pay | Admitting: Neurology

## 2016-10-21 ENCOUNTER — Ambulatory Visit: Payer: Self-pay | Admitting: Neurology

## 2016-10-21 VITALS — BP 122/76 | HR 110 | Ht <= 58 in | Wt 164.6 lb

## 2016-10-21 DIAGNOSIS — G44209 Tension-type headache, unspecified, not intractable: Secondary | ICD-10-CM | POA: Diagnosis not present

## 2016-10-21 DIAGNOSIS — R569 Unspecified convulsions: Secondary | ICD-10-CM | POA: Diagnosis not present

## 2016-10-21 NOTE — Progress Notes (Signed)
NEUROLOGY FOLLOW UP OFFICE NOTE  Natalie Wagner [id number]  HISTORY OF PRESENT ILLNESS: I had the pleasure of seeing Natalie Wagner in follow-up in the neurology clinic on 10/21/2016. She is again accompanied by her mother who helps supplement the history today. The patient was last seen 3 months ago for new onset seizure on 08/26/15. Her mother reported concern for seizures as an infant, but she was never started on seizure medication. Imaging and 24-hour EEG were normal. No further seizures with loss of consciousness or shaking spells since October 2016. On her last visit, main concern was daytime drowsiness due to poor sleep at night, as well as daily headaches. She had been started on Trazodone but had not taken it. She is now taking it regularly and reports improvement in her sleep, as well as with the headaches. She reports headaches are now mild, "not bad anymore," mostly weather-related. They actually resolved, but came back now that she has a cold/congestion. She denies any vision changes, focal numbness/tingling/weakness, no falls.  HPI: This is a 20 yo RH woman with a history of schizophrenia who presented after a seizure last 08/26/15. She recalls waking up then starting to feel dizzy and lightheaded, then heard her mother screaming and waking up on the couch. Her mother reports she lost consciousness and started shaking, rocking side to side. No tongue bite or incontinence. She was brought to [facility] ER where CBC and BMP were normal, no urine drug screen done. I personally reviewed head CT without contrast which was normal. Her mother reports she was born premature at 33 weeks and had delayed development with occupation and speech therapy as a child. She was in special education classes. As a baby, she would have episodes where she would get stiff as a board and shake, then in the 2nd grade she passed out with shaking and incontinence. She also had brief staring spells occurring around 3  times a week. She had seen pediatric neurologist Dr. Gaynell Face at that time, mother is unsure of diagnosis but denies any treatment for seizures. There is note that symptoms were felt to be due to migraines. The last staring spell was in the 7th grade. She was diagnosed with schizophrenia in the 9th grade, she was having visual hallucinations and would forget who she was, suddenly drinking from a fountain and spitting it out. She was admitted to inpatient psychiatry for 2 weeks and has been taking Zyprexa for the past 3 years. She denies any more hallucinations, but her mother is concerned about "strange behaviors" the past month. In the waiting room today, she was staring at the form and her mother told her to write something down. She then wrote over what her mother had filled out. She talks as if there is a third person in the room, sometimes she "just talks and does not know it." She closes and opens doors repetitively. She lives with her mother and denies any alcohol intake. She reports good sleep.   Epilepsy Risk Factors: She was born at 79 weeks with developmental delay. There is no history of febrile convulsions, CNS infections such as meningitis/encephalitis, significant traumatic brain injury, neurosurgical procedures, or family history of seizures.  Diagnostic Data: MRI brain with and without contrast done 01/14/15 for galactorrhea. I personally reviewed images, no acute changes, hippocampi symmetric with no abnormal signal or enhancement seen. Pituitary gland normal. EEG as above  I personally reviewed MRI brain with and without contrast 10/21/15 which was normal, hippocampi symmetric with  no abnormal signal or enhancement seen. Her 1-hour sleep-deprived EEG, as well as 24-hour EEG were normal. Typical events were not captured.    PAST MEDICAL HISTORY: Past Medical History:  Diagnosis Date  . Asthma    prn inhaler  . Constipation 05/05/2013  . Difficulty swallowing pills   . Elevated  prolactin level ([place]) 12/27/2014  . Galactorrhea 12/27/2014  . History of seizures as a child    mother states were triggered by migraines; no seizures in > 7 yr.  . 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 Migraines   . Schizophrenia [facility])     MEDICATIONS: Current Outpatient Prescriptions on File Prior to Visit  Medication Sig Dispense Refill  . albuterol (PROAIR HFA) 108 (90 BASE) MCG/ACT inhaler Inhale 2 puffs into the lungs every 4 (four) hours as needed for wheezing or shortness of breath. 2 Inhaler 11  . benztropine (COGENTIN) 1 MG tablet Take 1 tablet (1 mg total) by mouth 2 (two) times daily. 180 tablet 0  . cetirizine (ZYRTEC) 10 MG tablet Take 10 mg by mouth daily as needed for allergies.     Marland Kitchen estradiol cypionate (DEPO-ESTRADIOL) 5 MG/ML injection Inject 1.5 mg into the muscle every 28 (twenty-eight) days.    . fluticasone (FLONASE) 50 MCG/ACT nasal spray Place 2 sprays into the nose daily as needed for allergies.     . Melatonin 5 MG TABS Take 1 tablet by mouth daily as needed (sleep).     . OLANZapine (ZYPREXA) 20 MG tablet Take 1 tablet (20 mg total) by mouth at bedtime. 30 tablet 3  . Olopatadine HCl (PAZEO) 0.7 % SOLN Place 1 mL into both eyes daily as needed (allergies).     . polyethylene glycol powder (GLYCOLAX/MIRALAX) powder Take 255 g by mouth once. Take 1 capful once a day to have a soft stool daily. Can increase or decrease as needed 255 g 11  . levonorgestrel (MIRENA) 20 MCG/24HR IUD 1 Intra Uterine Device (1 each total) by Intrauterine route once. 1 each 0   No current facility-administered medications on file prior to visit.     ALLERGIES: Allergies  Allergen Reactions  . Lactose Intolerance (Gi) Diarrhea    FAMILY HISTORY: Family History  Problem Relation Age of Onset  . Diabetes Maternal Grandfather   . Hypertension Maternal Grandfather   . Heart disease Maternal Grandfather   . Kidney  disease Maternal Grandfather   . Anesthesia problems Maternal Grandfather     hx. of being hard to wake up post-op  . Hypertension Maternal Grandmother   . Stroke Maternal Grandmother   . Asthma Mother   . Hypertension Maternal Uncle     SOCIAL HISTORY: Social History   Social History  . Marital status: Single    Spouse name: N/A  . Number of children: N/A  . Years of education: N/A   Occupational History  . Not on file.   Social History Main Topics  . Smoking status: Never Smoker  . Smokeless tobacco: Never Used  . Alcohol use No  . Drug use: No  . Sexual activity: No   Other Topics Concern  . Not on file   Social History Narrative  . No narrative on file    REVIEW OF SYSTEMS: Constitutional: No fevers, chills, or sweats, no generalized fatigue, change in appetite Eyes: No visual changes, double vision, eye pain Ear, nose and throat: No hearing loss,  ear pain, nasal congestion, sore throat Cardiovascular: No chest pain, palpitations Respiratory:  No shortness of breath at rest or with exertion, wheezes GastrointestinaI: No nausea, vomiting, diarrhea, abdominal pain, fecal incontinence Genitourinary:  No dysuria, urinary retention or frequency Musculoskeletal:  No neck pain, back pain Integumentary: No rash, pruritus, skin lesions Neurological: as above Psychiatric: No depression, insomnia, anxiety Endocrine: No palpitations, fatigue, diaphoresis, mood swings, change in appetite, change in weight, increased thirst Hematologic/Lymphatic:  No anemia, purpura, petechiae. Allergic/Immunologic: + itchy/runny eyes, nasal congestion, no recent allergic reactions, rashes  PHYSICAL EXAM: Vitals:   10/21/16 1521  BP: 122/76  Pulse: (!) 110   General: No acute distress Head:  Normocephalic/atraumatic Neck: supple, no paraspinal tenderness, full range of motion Heart:  Regular rate and rhythm Lungs:  Clear to auscultation bilaterally Back: No paraspinal  tenderness Skin/Extremities: No rash, no edema Neurological Exam: alert and oriented to person, place, and time. No aphasia or dysarthria. Fund of knowledge is appropriate.  Recent and remote memory are intact.  Attention and concentration are normal.    Able to name objects and repeat phrases. Cranial nerves: Pupils equal, round, reactive to light.  Extraocular movements intact with no nystagmus. Visual fields full. Facial sensation intact. No facial asymmetry. Tongue, uvula, palate midline.  Motor: Bulk and tone normal, muscle strength 5/5 throughout with no pronator drift.  Sensation to light touch intact.  No extinction to double simultaneous stimulation.  Deep tendon reflexes 2+ throughout, toes downgoing.  Finger to nose testing intact.  Gait narrow-based and steady, able to tandem walk adequately.  Romberg negative.  IMPRESSION: This is a 20 yo RH woman with a history of schizophrenia, with a witnessed convulsion last 08/26/15. Her mother reported episodes concerning for possible seizures as a child (staring spells, stiffening episodes), however she was never started on seizure medication by pediatric neurology. Her 24-hour EEG is normal. MRI brain normal. No further similar symptoms since then. Her main concern last visit was daytime drowsiness and headaches. Neurological exam normal. Daily headaches and daytime drowsiness likely due to poor sleep at night, now that she is sleeping better, she reports headaches have improved. She will keep a headache calendar and will follow-up in 4 months. If seizures recur or headaches again worsen, we will plan to start a daily headache preventative medication such as Topamax. She does not drive and is aware of [place] driving laws to stop driving after an episode of loss of consciousness, until 6 months event-free.  Thank you for allowing me to participate in her care.  Please do not hesitate to call for any questions or concerns.  The duration of this appointment  visit was 25 minutes of face-to-face time with the patient.  Greater than 50% of this time was spent in counseling, explanation of diagnosis, planning of further management, and coordination of care.   Ellouise Newer, M.D.   CC: Dr. Abby Potash

## 2016-10-21 NOTE — Patient Instructions (Signed)
Keep a calendar of your headaches. Follow-up in 4 months, call for any changes.

## 2016-10-27 ENCOUNTER — Ambulatory Visit (INDEPENDENT_AMBULATORY_CARE_PROVIDER_SITE_OTHER): Payer: Medicaid Other | Admitting: Psychiatry

## 2016-10-27 ENCOUNTER — Encounter (HOSPITAL_COMMUNITY): Payer: Self-pay | Admitting: Psychiatry

## 2016-10-27 VITALS — BP 100/62 | HR 94 | Ht 59.5 in | Wt 169.0 lb

## 2016-10-27 DIAGNOSIS — F201 Disorganized schizophrenia: Secondary | ICD-10-CM | POA: Diagnosis not present

## 2016-10-27 DIAGNOSIS — Z825 Family history of asthma and other chronic lower respiratory diseases: Secondary | ICD-10-CM

## 2016-10-27 DIAGNOSIS — Z8249 Family history of ischemic heart disease and other diseases of the circulatory system: Secondary | ICD-10-CM

## 2016-10-27 DIAGNOSIS — Z8489 Family history of other specified conditions: Secondary | ICD-10-CM

## 2016-10-27 DIAGNOSIS — Z79899 Other long term (current) drug therapy: Secondary | ICD-10-CM

## 2016-10-27 DIAGNOSIS — Z833 Family history of diabetes mellitus: Secondary | ICD-10-CM

## 2016-10-27 DIAGNOSIS — Z841 Family history of disorders of kidney and ureter: Secondary | ICD-10-CM

## 2016-10-27 MED ORDER — BENZTROPINE MESYLATE 1 MG PO TABS: 1.0000 mg | ORAL_TABLET | Freq: Two times a day (BID) | ORAL | 0 refills | Status: DC

## 2016-10-27 MED ORDER — OLANZAPINE 20 MG PO TABS: 20.0000 mg | ORAL_TABLET | Freq: Every day | ORAL | 3 refills | Status: DC

## 2016-10-27 NOTE — Progress Notes (Signed)
Patient ID: Natalie Wagner, female   DOB: 1996-06-28, 20 y.o.   MRN: [id number]  [facility]  Progress Note  JACHELLE GOSLIN [id number] 20 y.o.  10/27/2016 9:41 AM  Chief Complaint: I am putting on weight from eating.    History of Present Illness:  reviewed information with patient on 10/27/16  and same as previous visits except as noted  Patient seen along with her mother patient gave permission for the mother to be in the room. Patient seen today for medication follow-up.  Pt is no longer getting the Abilify injection. She is taking Zyprexa daily.   Pt is taking Cogentin daily and denies any further muscle movements. States it improved after stopping the Abilify injection.   Pt states she is no longer having panic attacks.  Pt uses relaxation techniques that helps sometimes.    Pt denies depression. Denies anhedonia, isolation, crying spells, low motivation, poor hygiene, worthlessness and hopelessness. Denies SI/HI.  Denies AVH, ideas of reference and paranoia.    Pt is not exercising anymore. Pt is eating more and has gained weight  Pt states she is sleeping more but can not say how long.   Pt is in therapy and it is helping.   Mom states she has poor insight and is paranoid. Mom states when pt is alone she is talking to herself and laughing but it not as frequent since she is taking Zyprexa consistently. . Pt states she is watching tv. Pt is sleeping well.   Taking meds as prescribed and denies SE.   Suicidal Ideation: No Plan Formed: No Patient has means to carry out plan: No  Homicidal Ideation: No Plan Formed: No Patient has means to carry out plan: No  Review of Systems  Eyes:       Wears glasses  Gastrointestinal: Positive for abdominal pain. Negative for nausea and vomiting.       Lactose intolerance  Musculoskeletal: Negative for back pain, joint pain, myalgias and neck pain.  Neurological: Positive for headaches. Negative for dizziness,  tremors, seizures and loss of consciousness.  Psychiatric/Behavioral: Negative for depression, hallucinations, memory loss, substance abuse and suicidal ideas. The patient is not nervous/anxious and does not have insomnia.     Past Medical Family, Social History: Patient is staying at home Family History  Problem Relation Age of Onset  . Diabetes Maternal Grandfather   . Hypertension Maternal Grandfather   . Heart disease Maternal Grandfather   . Kidney disease Maternal Grandfather   . Anesthesia problems Maternal Grandfather     hx. of being hard to wake up post-op  . Hypertension Maternal Grandmother   . Stroke Maternal Grandmother   . Asthma Mother   . Hypertension Maternal Uncle    Past Medical History:  Diagnosis Date  . Asthma    prn inhaler  . Constipation 05/05/2013  . Difficulty swallowing pills   . Elevated prolactin level ([place]) 12/27/2014  . Galactorrhea 12/27/2014  . History of seizures as a child    mother states were triggered by migraines; no seizures in > 7 yr.  . 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 Migraines   . Schizophrenia [facility])     Outpatient Encounter Prescriptions as of 10/27/2016  Medication Sig Dispense Refill  . albuterol (PROAIR HFA) 108 (90 BASE) MCG/ACT inhaler Inhale 2 puffs into the lungs every 4 (four) hours as needed for wheezing or shortness  of breath. 2 Inhaler 11  . benztropine (COGENTIN) 1 MG tablet Take 1 tablet (1 mg total) by mouth 2 (two) times daily. 180 tablet 0  . cetirizine (ZYRTEC) 10 MG tablet Take 10 mg by mouth daily as needed for allergies.     Marland Kitchen estradiol cypionate (DEPO-ESTRADIOL) 5 MG/ML injection Inject 1.5 mg into the muscle every 28 (twenty-eight) days.    . fluticasone (FLONASE) 50 MCG/ACT nasal spray Place 2 sprays into the nose daily as needed for allergies.     . Melatonin 5 MG TABS Take 1 tablet by mouth daily as needed (sleep).     . OLANZapine  (ZYPREXA) 20 MG tablet Take 1 tablet (20 mg total) by mouth at bedtime. 30 tablet 3  . Olopatadine HCl (PAZEO) 0.7 % SOLN Place 1 mL into both eyes daily as needed (allergies).     . polyethylene glycol powder (GLYCOLAX/MIRALAX) powder Take 255 g by mouth once. Take 1 capful once a day to have a soft stool daily. Can increase or decrease as needed 255 g 11  . levonorgestrel (MIRENA) 20 MCG/24HR IUD 1 Intra Uterine Device (1 each total) by Intrauterine route once. 1 each 0   No facility-administered encounter medications on file as of 10/27/2016.     Past Psychiatric History/Hospitalization(s): Anxiety: No Bipolar Disorder: No Depression: No Mania: No Psychosis: Yes Schizophrenia: Yes Personality Disorder: No Hospitalization for psychiatric illness: Yes History of Electroconvulsive Shock Therapy: No Prior Suicide Attempts: Yes  Physical Exam:AIMS score is 0 Constitutional: Blood pressure 100/62, pulse 94, height 4' 11.5" (1.511 m), weight 169 lb (76.7 kg). General Appearance: alert, oriented, no acute distress and obese  Musculoskeletal: Strength & Muscle Tone: within normal limits Gait & Station: normal Patient leans: straight  Mental Status Examination/Evaluation: reviewed information on 10/27/16  and same as previous visits except as noted  Objective: Attitude: Calm and cooperative  Appearance: Casual, appears to be stated age  Eye Contact::  Minimal  Speech:  Clear and Coherent and Normal Rate  Volume:  up and down, mostly low  Mood:  euthymic  Affect:  Flat  Thought Process:  Slow, concrete  Orientation:  Full (Time, Place, and Person)  Thought Content:  WDL  Suicidal Thoughts:  No  Homicidal Thoughts:  No  Judgement:  Poor  Insight:  Lacking  Concentration: good  Memory: Immediate-fair Recent-fair Remote-fair  Recall: fair  Language: fair  Gait and Station: normal  [employer] of Knowledge: average  Psychomotor Activity:  Normal  Akathisia:  No  Handed:   Right  AIMS (if indicated):  Facial and Oral Movements  Muscles of Facial Expression: None, normal  Lips and Perioral Area: None, normal  Jaw: None, normal  Tongue: None, normal Extremity Movements: Upper (arms, wrists, hands, fingers): None, normal  Lower (legs, knees, ankles, toes): None, normal,  Trunk Movements:  Neck, shoulders, hips: None, normal,  Overall Severity : Severity of abnormal movements (highest score from questions above): None, normal  Incapacitation due to abnormal movements: None, normal  Patient's awareness of abnormal movements (rate only patient's report): No Awareness, Dental Status  Current problems with teeth and/or dentures?: No  Does patient usually wear dentures?: No    Assets:  Desire for Improvement Housing Transportation        Assessment:  reviewed information below with patient on 10/27/16  and same as previous visits except as noted   SCHIZOPHRENIA- UNDIFFERENTIATED TYPE,   Oppositional defiant disorder Insomnia Borderline diabetes Polycystic ovarian disease  Plan: Schizophrenia Zyprexa 20mg  po qHS for psychosis Continue Cogentin 1 mg daily for EPS.   Insomnia:  none  Obesity Encouraged to walk daily and making healthy choices in regards to food as patient has gained weight loss since her last visit. -pt declined Metformin due to hx of SE  Labs: 07/27/16 Chol elevated, HbA1C WNL, CMP WNL ordered CBC, TSH, Prolactin level, EKG  Continue to follow-up with her primary care physician  Pt denies SI and is at an acute low risk for suicide.Patient told to call clinic if any problems occur. Patient advised to go to ER if they should develop SI/HI, side effects, or if symptoms worsen. Has crisis numbers to call if needed. Pt verbalized understanding.  F/up in 2 months or sooner if needed   [name], MD

## 2016-10-30 ENCOUNTER — Encounter: Payer: Self-pay | Admitting: Family

## 2016-10-30 ENCOUNTER — Ambulatory Visit (INDEPENDENT_AMBULATORY_CARE_PROVIDER_SITE_OTHER): Payer: Medicaid Other | Admitting: Family

## 2016-10-30 VITALS — BP 125/77 | HR 102 | Ht <= 58 in | Wt 166.4 lb

## 2016-10-30 DIAGNOSIS — Z975 Presence of (intrauterine) contraceptive device: Secondary | ICD-10-CM | POA: Diagnosis not present

## 2016-10-30 NOTE — Patient Instructions (Signed)
Let us know if you have any questions or concerns.  You're IUD strings are visible.

## 2016-11-15 NOTE — Progress Notes (Signed)
THIS RECORD MAY CONTAIN CONFIDENTIAL INFORMATION THAT SHOULD NOT BE RELEASED WITHOUT REVIEW OF THE SERVICE PROVIDER.  Adolescent Medicine Consultation Follow-Up Visit Natalie Wagner  is a 21 y.o. female referred by Sarajane Jews, * here today for follow-up regarding IUD in place.   Last seen in [facility] on 09/28/16 for IUD insertion.  Plan at last visit included IUD insertion.   History was provided by the patient.  PCP Confirmed?  yes  My Chart Activated?   yes    Chief Complaint  Patient presents with  . Follow-up    HPI:    Presents for IUD string. No concerns or complaints.  No bleeding concerns.   No LMP recorded. Allergies  Allergen Reactions  . Lactose Intolerance (Gi) Diarrhea   Outpatient Medications Prior to Visit  Medication Sig Dispense Refill  . albuterol (PROAIR HFA) 108 (90 BASE) MCG/ACT inhaler Inhale 2 puffs into the lungs every 4 (four) hours as needed for wheezing or shortness of breath. 2 Inhaler 11  . benztropine (COGENTIN) 1 MG tablet Take 1 tablet (1 mg total) by mouth 2 (two) times daily. 180 tablet 0  . cetirizine (ZYRTEC) 10 MG tablet Take 10 mg by mouth daily as needed for allergies.     Marland Kitchen estradiol cypionate (DEPO-ESTRADIOL) 5 MG/ML injection Inject 1.5 mg into the muscle every 28 (twenty-eight) days.    . fluticasone (FLONASE) 50 MCG/ACT nasal spray Place 2 sprays into the nose daily as needed for allergies.     . Melatonin 5 MG TABS Take 1 tablet by mouth daily as needed (sleep).     . OLANZapine (ZYPREXA) 20 MG tablet Take 1 tablet (20 mg total) by mouth at bedtime. 30 tablet 3  . Olopatadine HCl (PAZEO) 0.7 % SOLN Place 1 mL into both eyes daily as needed (allergies).     . polyethylene glycol powder (GLYCOLAX/MIRALAX) powder Take 255 g by mouth once. Take 1 capful once a day to have a soft stool daily. Can increase or decrease as needed 255 g 11  . levonorgestrel (MIRENA) 20 MCG/24HR IUD 1 Intra Uterine Device (1  each total) by Intrauterine route once. 1 each 0   No facility-administered medications prior to visit.      Patient Active Problem List   Diagnosis Date Noted  . Tension-type headache, not intractable 10/21/2016  . Eating disorder 07/27/2016  . Hirsutism 04/01/2016  . Simple cyst of kidney 01/24/2016  . Convulsions ([place]) 09/03/2015  . Undifferentiated schizophrenia ([place]) 09/03/2015  . Acanthosis 05/20/2015  . CN (constipation) 12/27/2014  . Therapeutic drug monitoring 06/03/2014  . Hyperlipidemia LDL goal <130 05/23/2014  . Lactose intolerance documented with breath testing 10/25/2013  . Irritable bowel syndrome 05/23/2013  . 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 Acne vulgaris 05/05/2013  . Allergic rhinitis 05/05/2013  . PCOS (polycystic ovarian syndrome) 05/05/2013  . Snoring 05/05/2013   The following portions of the patient's history were reviewed and updated as appropriate: allergies, current medications and past medical history.  Physical Exam:  Vitals:   10/30/16 1132  BP: 125/77  Pulse: (!) 102  Weight: 166 lb 6.4 oz (75.5 kg)  Height: 4\' 10"  (1.473 m)   BP 125/77   Pulse (!) 102   Ht 4\' 10"  (1.473 m)   Wt 166 lb 6.4 oz (75.5 kg)   BMI 34.78 kg/m  Body mass index: body mass index is 34.78 kg/m. Growth percentile SmartLinks can only be used for patients less than 52 years old.  Physical Exam  Constitutional: She appears well-developed. No distress.  HENT:  Head: Normocephalic and atraumatic.  Neck: Normal range of motion. Neck supple.  Cardiovascular: Normal rate and regular rhythm.   No murmur heard. Pulmonary/Chest: Effort normal and breath sounds normal.  Abdominal: Soft.  Genitourinary: Vagina normal. No vaginal discharge found.  Genitourinary Comments: Strings visible   Musculoskeletal: She exhibits no edema.  Lymphadenopathy:    She has no cervical adenopathy.  Neurological: She is alert.  Skin: Skin is warm and dry. No rash noted.     Assessment/Plan: 1. IUD (intrauterine device) in place -no concerns -return precautions given    Follow-up:  Return if symptoms worsen or fail to improve.

## 2016-11-21 LAB — CBC
HEMATOCRIT: 38.5 % (ref 35.0–45.0)
HEMOGLOBIN: 12.6 g/dL (ref 11.7–15.5)
MCH: 30.6 pg (ref 27.0–33.0)
MCHC: 32.7 g/dL (ref 32.0–36.0)
MCV: 93.4 fL (ref 80.0–100.0)
MPV: 9.7 fL (ref 7.5–12.5)
PLATELETS: 324 10*3/uL (ref 140–400)
RBC: 4.12 MIL/uL (ref 3.80–5.10)
RDW: 12.8 % (ref 11.0–15.0)
WBC: 6.7 10*3/uL (ref 3.8–10.8)

## 2016-11-21 LAB — PROLACTIN: Prolactin: 34 ng/mL — ABNORMAL HIGH

## 2016-11-21 LAB — TSH: TSH: 2.18 mIU/L

## 2016-12-07 ENCOUNTER — Ambulatory Visit (INDEPENDENT_AMBULATORY_CARE_PROVIDER_SITE_OTHER): Payer: Medicaid Other | Admitting: Clinical

## 2016-12-07 ENCOUNTER — Encounter (HOSPITAL_COMMUNITY): Payer: Self-pay | Admitting: Clinical

## 2016-12-07 DIAGNOSIS — F201 Disorganized schizophrenia: Secondary | ICD-10-CM | POA: Diagnosis not present

## 2016-12-07 NOTE — Progress Notes (Signed)
   THERAPIST PROGRESS NOTE  Session Time: 10:00 -10:55 am  Participation Level: Active  Behavioral Response: Casual and NeatAlertNA  Type of Therapy: Individual Therapy  Treatment Goals addressed: Improve Psychiatric Symptoms, regulate mood, improve unhelpful thought patterns, social skills (socialize more), stress management, learn about diagnosis, healthy coping skills  Interventions: Motivational Interviewing, CBT,  psychoeducation  Summary: Natalie Wagner is a 25.yo. female who presents with Schizophrenia, disorganized type     Suicidal/Homicidal: No -without intent/plan  Therapist Response: Natalie Wagner met with clinician for an individual session. Natalie Wagner discussed her psychiatric symptoms, her current life events and her homework. Natalie Wagner shared that she had been doing "pretty good." She shared that she had a good holiday. Clinician asked open ended questions about how her medications are working. Client and clinician discussed her diagnosis. She shared that she is feeling better and that she doesn't feel as scattered. She stated that she has been experiencing less hallucinations. She shared that she was eating compulsively which she wants to work on. Clinician asked open ended questions and Natalie Wagner identified steps she could take to improve her eating habits. Client and clinician reviewed and discussed her homework - stress management packet 3. Natalie Wagner shared her thoughts and insights from the homework. She shared that she has been practicing stress reduction techniques. Client and clinician discussed her techniques.  Natalie Wagner shared that the biggest issue she is experiencing right now is a lot of boredom. Clinician asked open ended questions and Natalie Wagner identified things she could do to elevate her mood. She recognized that she spends a lot of time watching tv rather than engaging in activities. Client and clinician discussed opportunities such as attending classes at the [employer] to allow her to interact with others more.   Plan: Return again in 1- 2 weeks  Diagnosis:     Axis I: Schizophrenia, disorganized type       Natalie Wagner A, LCSW 12/07/2016

## 2016-12-15 ENCOUNTER — Encounter: Payer: Self-pay | Admitting: Pediatrics

## 2016-12-15 ENCOUNTER — Other Ambulatory Visit: Payer: Self-pay | Admitting: Pediatrics

## 2016-12-30 ENCOUNTER — Other Ambulatory Visit: Payer: Self-pay | Admitting: Pediatrics

## 2016-12-30 DIAGNOSIS — K5900 Constipation, unspecified: Secondary | ICD-10-CM

## 2017-01-26 ENCOUNTER — Ambulatory Visit (HOSPITAL_COMMUNITY): Payer: Self-pay | Admitting: Psychiatry

## 2017-01-28 ENCOUNTER — Encounter (HOSPITAL_COMMUNITY): Payer: Self-pay | Admitting: Psychiatry

## 2017-01-28 ENCOUNTER — Ambulatory Visit (INDEPENDENT_AMBULATORY_CARE_PROVIDER_SITE_OTHER): Payer: Medicaid Other | Admitting: Psychiatry

## 2017-01-28 VITALS — BP 124/74 | Ht 59.25 in | Wt 189.0 lb

## 2017-01-28 DIAGNOSIS — Z91011 Allergy to milk products: Secondary | ICD-10-CM

## 2017-01-28 DIAGNOSIS — G4700 Insomnia, unspecified: Secondary | ICD-10-CM | POA: Diagnosis not present

## 2017-01-28 DIAGNOSIS — Z79899 Other long term (current) drug therapy: Secondary | ICD-10-CM | POA: Diagnosis not present

## 2017-01-28 DIAGNOSIS — F913 Oppositional defiant disorder: Secondary | ICD-10-CM

## 2017-01-28 DIAGNOSIS — F201 Disorganized schizophrenia: Secondary | ICD-10-CM

## 2017-01-28 MED ORDER — BENZTROPINE MESYLATE 1 MG PO TABS: 1.0000 mg | ORAL_TABLET | Freq: Two times a day (BID) | ORAL | 0 refills | Status: DC

## 2017-01-28 MED ORDER — OLANZAPINE 20 MG PO TABS: 20.0000 mg | ORAL_TABLET | Freq: Every day | ORAL | 3 refills | Status: DC

## 2017-01-28 NOTE — Progress Notes (Signed)
BH MD/PA/NP OP Progress Note  01/28/2017 10:20 AM Natalie Wagner  MRN:  [id number]  Chief Complaint:  Chief Complaint    Follow-up     HPI: Here with mother. Mom states pt is doing "ok". She random moments where she appears to be responding to voices by mumbling or laughing at nothing. Pt states it is less frequent than before. Mom is looking for ways to add scructure to pt's day.   Pt has spending her time watching tv and eating.  Sleep is good and energy is low. She is bored so she often naps during the daytime.  Pt denies depression and anxiety. Denies SI/HI.  Pt denies AVH but later states she hears voices tell her jokes. Pt unable to give any details such as female/female, single vs multiple voices. Pt denies ideas of reference and paranoia.   Taking meds as prescribed and endorsing weight gain as a SE.   Pt reports her breasts have grown in size and a few days ago she had a white discharge. Recommended pt follow up with Gyn.    Visit Diagnosis:    ICD-9-CM ICD-10-CM   1. Encounter for long-term (current) use of medications V58.69 Z79.899   2. Schizophrenia, disorganized type ([place]) 295.10 F20.1 OLANZapine (ZYPREXA) 20 MG tablet     benztropine (COGENTIN) 1 MG tablet    Past Psychiatric History: see H&P  Past Medical History:  Past Medical History:  Diagnosis Date  . Asthma    prn inhaler  . Constipation 05/05/2013  . Difficulty swallowing pills   . Elevated prolactin level ([place]) 12/27/2014  . Galactorrhea 12/27/2014  . History of seizures as a child    mother states were triggered by migraines; no seizures in > 7 yr.  . 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 Migraines   . Schizophrenia [facility])     Past Surgical History:  Procedure Laterality Date  . COLONOSCOPY    . MRI     under anesthesia  . 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 UMBILICAL HERNIA REPAIR      Family Psychiatric History:  Family History  Problem Relation Age of Onset  . Diabetes Maternal Grandfather   . Hypertension Maternal Grandfather   . Heart disease Maternal Grandfather   . Kidney disease Maternal Grandfather   . Anesthesia problems Maternal Grandfather     hx. of being hard to wake up post-op  . Hypertension Maternal Grandmother   . Stroke Maternal Grandmother   . Asthma Mother   . Hypertension Maternal Uncle     Social History:  Social History   Social History  . Marital status: Single    Spouse name: N/A  . Number of children: N/A  . Years of education: N/A   Social History Main Topics  . Smoking status: Never Smoker  . Smokeless tobacco: Never Used  . Alcohol use No  . Drug use: No  . Sexual activity: No   Other Topics Concern  . None   Social History Narrative  . None    Allergies:  Allergies  Allergen Reactions  . Lactose Intolerance (Gi) Diarrhea    Metabolic Disorder Labs: Lab Results  Component Value Date   HGBA1C 4.9 07/27/2016  MPG 94 07/27/2016   MPG 105 12/26/2015   Lab Results  Component Value Date   PROLACTIN 34.0 (H) 11/21/2016   PROLACTIN 25.7 12/19/2014   Lab Results  Component Value Date   CHOL 173 (H) 07/27/2016   TRIG 60 07/27/2016   HDL 57 07/27/2016   CHOLHDL 3.0 07/27/2016   VLDL 12 07/27/2016   LDLCALC 104 07/27/2016   LDLCALC 96 07/08/2015     Current Medications: Current Outpatient Prescriptions  Medication Sig Dispense Refill  . albuterol (PROAIR HFA) 108 (90 BASE) MCG/ACT inhaler Inhale 2 puffs into the lungs every 4 (four) hours as needed for wheezing or shortness of breath. 2 Inhaler 11  . benztropine (COGENTIN) 1 MG tablet Take 1 tablet (1 mg total) by mouth 2 (two) times daily. 180 tablet 0  . cetirizine (ZYRTEC) 10 MG tablet Take 10 mg by mouth daily as  needed for allergies.     . fluticasone (FLONASE) 50 MCG/ACT nasal spray Place 2 sprays into the nose daily as needed for allergies.     . Melatonin 5 MG TABS Take 1 tablet by mouth daily as needed (sleep).     . OLANZapine (ZYPREXA) 20 MG tablet Take 1 tablet (20 mg total) by mouth at bedtime. 30 tablet 3  . polyethylene glycol powder (GLYCOLAX/MIRALAX) powder Take 255 g by mouth once. Take 1 capful once a day to have a soft stool daily. Can increase or decrease as needed 255 g 11  . polyethylene glycol powder (GLYCOLAX/MIRALAX) powder TAKE 17 GM(1 CAPFUL) ONCE DAILY 255 g 5  . levonorgestrel (MIRENA) 20 MCG/24HR IUD 1 Intra Uterine Device (1 each total) by Intrauterine route once. 1 each 0  . Olopatadine HCl (PAZEO) 0.7 % SOLN Place 1 mL into both eyes daily as needed (allergies).      No current facility-administered medications for this visit.       Musculoskeletal: Strength & Muscle Tone: within normal limits Gait & Station: normal Patient leans: N/A  Psychiatric Specialty Exam: Review of Systems  Constitutional: Negative for chills, fever and weight loss.  HENT: Negative for congestion, ear pain, sinus pain and sore throat.   Neurological: Negative for dizziness, tremors, sensory change, seizures, loss of consciousness and headaches.  Psychiatric/Behavioral: Negative for depression, hallucinations, substance abuse and suicidal ideas. The patient is not nervous/anxious and does not have insomnia.     Blood pressure 124/74, height 4' 11.25" (1.505 m), weight 189 lb (85.7 kg).Body mass index is 37.85 kg/m.  General Appearance: Casual  Eye Contact:  Fair  Speech:  Clear and Coherent and Normal Rate  Volume:  variable  Mood:  Euthymic  Affect:  Congruent  Thought Process:  Linear and Descriptions of Associations: Intact  Orientation:  Full (Time, Place, and Person)  Thought Content: concrete   Suicidal Thoughts:  No  Homicidal Thoughts:  No  Memory:  Immediate;   Fair Recent;    Fair Remote;   Fair  Judgement:  Poor  Insight:  Lacking  Psychomotor Activity:  Normal  Concentration:  Concentration: Fair and Attention Span: Fair  Recall:  [employer] of Knowledge: Fair  Language: Fair  Akathisia:  No  Handed:  Right  AIMS (if indicated):  AIMS:  Facial and Oral Movements  Muscles of Facial Expression: None, normal  Lips and Perioral Area: None, normal  Jaw: None, normal  Tongue: None, normal Extremity Movements: Upper (arms, wrists, hands, fingers): None, normal  Lower (legs, knees, ankles, toes): None, normal,  Trunk  Movements:  Neck, shoulders, hips: None, normal,  Overall Severity : Severity of abnormal movements (highest score from questions above): None, normal  Incapacitation due to abnormal movements: None, normal  Patient's awareness of abnormal movements (rate only patient's report): No Awareness, Dental Status  Current problems with teeth and/or dentures?: No  Does patient usually wear dentures?: No     Assets:  Desire for Improvement Housing  ADL's:  Intact  Cognition: WNL  Sleep:  good     Treatment Plan Summary:Medication management and Plan see belowl  Assessment: Schizophrenia- undifferentiated type ODD Insomnia   Medication management with supportive therapy. Risks/benefits and SE of the medication discussed. Pt verbalized understanding and verbal consent obtained for treatment.  Affirm with the patient that the medications are taken as ordered. Patient expressed understanding of how their medications were to be used.   The risk of un-intended pregnancy is low based on the fact that pt reports she is using IUD. Pt is aware that these meds carry a teratogenic risk. Pt will discuss plan of action if she does or plans to become pregnant in the future.   Meds: continue Zyprexa 20mg  po qHS for schizophrenia as pt appears to improving Continue Cogentin 1mg  BID to prevent EPS May consider adding Abilify if no change in prolactin.    Labs: 11/21/2016 CBC WNL,  Prolactin level 34, TSH WNL, ordered EKG    Therapy: brief supportive therapy provided. Discussed psychosocial stressors in detail.   Encouraged pt to develop daily routine and work on daily goal setting as a way to improve mood symptoms.  Reviewed sleep hygiene in detail Recommended pt stop all drug and alcohol use  Consultations:  Encouraged pt to follow up with GYN  Pt denies SI and is at an acute low risk for suicide. Patient told to call clinic if any problems occur. Patient advised to go to ER if they should develop SI/HI, side effects, or if symptoms worsen. Has crisis numbers to call if needed. Pt verbalized understanding.  F/up in 3 months or sooner if needed   [name], MD 01/28/2017, 10:20 AM

## 2017-02-04 ENCOUNTER — Ambulatory Visit (INDEPENDENT_AMBULATORY_CARE_PROVIDER_SITE_OTHER): Payer: Medicaid Other | Admitting: Clinical

## 2017-02-04 ENCOUNTER — Encounter (HOSPITAL_COMMUNITY): Payer: Self-pay | Admitting: Clinical

## 2017-02-04 DIAGNOSIS — F201 Disorganized schizophrenia: Secondary | ICD-10-CM

## 2017-02-04 NOTE — Progress Notes (Signed)
   THERAPIST PROGRESS NOTE  Session Time: 10:05 -10:58  Participation Level: Active  Behavioral Response: CasualAlertNA and  confussed sometimes  Type of Therapy: Individual Therapy  Treatment Goals addressed: Improve Psychiatric Symptoms, regulate mood, learn about diagnosis, healthy coping skills  Interventions: Motivational Interviewing, cbt  Summary: Natalie Wagner is a 49.yo. female who presents with Schizophrenia, disorganized type     Suicidal/Homicidal: No -without intent/plan  Therapist Response: [name] met with clinician for an individual session. [name] discussed her psychiatric symptoms, her current life events and her homework. [name] shared that her medication was working fairly well. She shared that she has not been hallucinating as much. She shared that she has continued to eat too much. She shared that she has been trying to eat better but has been having difficulty stopping herself from eating out of boredom. Clinician asked open ended questions and [name] shared about her daily routine. She shared she has difficulty concentrating on things and is unable to get to groups suggested by clinician . Client and clinician discussed her diagnosis and symptoms [name] would like to socialize more. She is currently too young to go to the local day program for individuals with severe and persistent mental illness. She shared that she finished module 4 of distress. Client and clinician reviewed and discussed it.  She reported that she likes the modules but she has difficulty because of the amount of reading. She shared she likes the process of doing worksheets and finds them helpful. Clinician agreed to look for information that was less reading intensive. Clinician asked open ended questions and [name] identified some healthy coping skills and added to her list things she could do when she is bored to improve her mood.   Plan: Return again in 1- 2 weeks  Diagnosis:      Axis I: Schizophrenia, disorganized type                                   Imelda Dandridge A, LCSW 02/04/2017

## 2017-02-15 ENCOUNTER — Ambulatory Visit: Payer: Medicaid Other | Admitting: Neurology

## 2017-02-17 ENCOUNTER — Ambulatory Visit (INDEPENDENT_AMBULATORY_CARE_PROVIDER_SITE_OTHER): Payer: Medicaid Other | Admitting: Neurology

## 2017-02-17 ENCOUNTER — Encounter: Payer: Self-pay | Admitting: Neurology

## 2017-02-17 VITALS — BP 110/68 | HR 98 | Temp 98.6°F | Resp 18 | Ht 59.0 in | Wt 191.8 lb

## 2017-02-17 DIAGNOSIS — R569 Unspecified convulsions: Secondary | ICD-10-CM

## 2017-02-17 DIAGNOSIS — G44209 Tension-type headache, unspecified, not intractable: Secondary | ICD-10-CM

## 2017-02-17 MED ORDER — TOPIRAMATE 25 MG PO TABS: ORAL_TABLET | ORAL | 6 refills | Status: DC

## 2017-02-17 NOTE — Progress Notes (Signed)
NEUROLOGY FOLLOW UP OFFICE NOTE  Natalie Wagner [id number]  HISTORY OF PRESENT ILLNESS: I had the pleasure of seeing Natalie Wagner in follow-up in the neurology clinic on 02/17/2017. She is again accompanied by her mother who helps supplement the history today. The patient was last seen 4 months ago for headaches and new onset seizure last 08/26/15. Her mother reported concern for seizures as an infant, but she was never started on seizure medication. Imaging and 24-hour EEG were normal. No further seizures with loss of consciousness or shaking spells since October 2016. She reports frequent headaches occurring around once a week, she had 2 headaches this week. She has sharp pains in the back of her head lasting 5-10 minutes with no associated nausea/vomiting, photo or phonophobia. She feels herself staring off when she has bad headaches. Her mother has not witnessed any staring or convulsions, but reports some mild hand tremors. She has more headaches at night. She is sleeping a little better but states she moves a lot and sometimes her snoring would wake her up. She denies any daytime drowsiness. She has gained weight since restarting Zyprexa. She denies any vision changes, focal numbness/tingling/weakness, no falls.  HPI: This is a 21 yo RH woman with a history of schizophrenia who presented after a seizure last 08/26/15. She recalls waking up then starting to feel dizzy and lightheaded, then heard her mother screaming and waking up on the couch. Her mother reports she lost consciousness and started shaking, rocking side to side. No tongue bite or incontinence. She was brought to [facility] ER where CBC and BMP were normal, no urine drug screen done. I personally reviewed head CT without contrast which was normal. Her mother reports she was born premature at 45 weeks and had delayed development with occupation and speech therapy as a child. She was in special education classes. As a baby, she would have  episodes where she would get stiff as a board and shake, then in the 2nd grade she passed out with shaking and incontinence. She also had brief staring spells occurring around 3 times a week. She had seen pediatric neurologist Dr. Gaynell Face at that time, mother is unsure of diagnosis but denies any treatment for seizures. There is note that symptoms were felt to be due to migraines. The last staring spell was in the 7th grade. She was diagnosed with schizophrenia in the 9th grade, she was having visual hallucinations and would forget who she was, suddenly drinking from a fountain and spitting it out. She was admitted to inpatient psychiatry for 2 weeks and has been taking Zyprexa for the past 3 years. She denies any more hallucinations, but her mother is concerned about "strange behaviors" the past month. In the waiting room today, she was staring at the form and her mother told her to write something down. She then wrote over what her mother had filled out. She talks as if there is a third person in the room, sometimes she "just talks and does not know it." She closes and opens doors repetitively. She lives with her mother and denies any alcohol intake. She reports good sleep.   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 There is no history of febrile convulsions, CNS infections such as meningitis/encephalitis, significant traumatic brain injury, neurosurgical procedures, or family history of seizures.  Diagnostic Data: MRI brain with and without contrast done 01/14/15 for galactorrhea. I personally reviewed images, no acute changes, hippocampi symmetric with no  abnormal signal or enhancement seen. Pituitary gland normal. EEG as above  I personally reviewed MRI brain with and without contrast 10/21/15 which was normal, hippocampi symmetric with no abnormal signal or enhancement seen. Her 1-hour sleep-deprived EEG, as well as 24-hour EEG were normal. Typical events were not  captured.    PAST MEDICAL HISTORY: Past Medical History:  Diagnosis Date  . Asthma    prn inhaler  . Constipation 05/05/2013  . Difficulty swallowing pills   . Elevated prolactin level ([place]) 12/27/2014  . Galactorrhea 12/27/2014  . History of seizures as a child    mother states were triggered by migraines; no seizures in > 7 yr.  . 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 Migraines   . Schizophrenia [facility])     MEDICATIONS: Current Outpatient Prescriptions on File Prior to Visit  Medication Sig Dispense Refill  . albuterol (PROAIR HFA) 108 (90 BASE) MCG/ACT inhaler Inhale 2 puffs into the lungs every 4 (four) hours as needed for wheezing or shortness of breath. 2 Inhaler 11  . benztropine (COGENTIN) 1 MG tablet Take 1 tablet (1 mg total) by mouth 2 (two) times daily. 180 tablet 0  . cetirizine (ZYRTEC) 10 MG tablet Take 10 mg by mouth daily as needed for allergies.     . fluticasone (FLONASE) 50 MCG/ACT nasal spray Place 2 sprays into the nose daily as needed for allergies.     Marland Kitchen levonorgestrel (MIRENA) 20 MCG/24HR IUD 1 Intra Uterine Device (1 each total) by Intrauterine route once. 1 each 0  . Melatonin 5 MG TABS Take 1 tablet by mouth daily as needed (sleep).     . OLANZapine (ZYPREXA) 20 MG tablet Take 1 tablet (20 mg total) by mouth at bedtime. 90 tablet 3  . Olopatadine HCl (PAZEO) 0.7 % SOLN Place 1 mL into both eyes daily as needed (allergies).     . polyethylene glycol powder (GLYCOLAX/MIRALAX) powder Take 255 g by mouth once. Take 1 capful once a day to have a soft stool daily. Can increase or decrease as needed 255 g 11  . polyethylene glycol powder (GLYCOLAX/MIRALAX) powder TAKE 17 GM(1 CAPFUL) ONCE DAILY 255 g 5   No current facility-administered medications on file prior to visit.     ALLERGIES: Allergies  Allergen Reactions  . Lactose Intolerance (Gi) Diarrhea    FAMILY HISTORY: Family History    Problem Relation Age of Onset  . Diabetes Maternal Grandfather   . Hypertension Maternal Grandfather   . Heart disease Maternal Grandfather   . Kidney disease Maternal Grandfather   . Anesthesia problems Maternal Grandfather     hx. of being hard to wake up post-op  . Hypertension Maternal Grandmother   . Stroke Maternal Grandmother   . Asthma Mother   . Hypertension Maternal Uncle     SOCIAL HISTORY: Social History   Social History  . Marital status: Single    Spouse name: N/A  . Number of children: N/A  . Years of education: N/A   Occupational History  . Not on file.   Social History Main Topics  . Smoking status: Never Smoker  . Smokeless tobacco: Never Used  . Alcohol use No  . Drug use: No  . Sexual activity: No   Other Topics Concern  . Not on file   Social History Narrative  . No narrative on file    REVIEW OF SYSTEMS: Constitutional: No  fevers, chills, or sweats, no generalized fatigue, change in appetite Eyes: No visual changes, double vision, eye pain Ear, nose and throat: No hearing loss, ear pain, nasal congestion, sore throat Cardiovascular: No chest pain, palpitations Respiratory:  No shortness of breath at rest or with exertion, wheezes GastrointestinaI: No nausea, vomiting, diarrhea, abdominal pain, fecal incontinence Genitourinary:  No dysuria, urinary retention or frequency Musculoskeletal:  No neck pain, back pain Integumentary: No rash, pruritus, skin lesions Neurological: as above Psychiatric: No depression, insomnia, anxiety Endocrine: No palpitations, fatigue, diaphoresis, mood swings, change in appetite, change in weight, increased thirst Hematologic/Lymphatic:  No anemia, purpura, petechiae. Allergic/Immunologic: No itchy/runny eyes, nasal congestion, no recent allergic reactions, rashes  PHYSICAL EXAM: Vitals:   02/17/17 0851  BP: 110/68  Pulse: 98  Resp: 18  Temp: 98.6 F (37 C)   General: No acute distress Head:   Normocephalic/atraumatic Neck: supple, no paraspinal tenderness, full range of motion Heart:  Regular rate and rhythm Lungs:  Clear to auscultation bilaterally Back: No paraspinal tenderness Skin/Extremities: No rash, no edema Neurological Exam: alert and oriented to person, place, and time. No aphasia or dysarthria. Fund of knowledge is appropriate.  Recent and remote memory are intact.  Attention and concentration are normal.    Able to name objects and repeat phrases. Cranial nerves: Pupils equal, round, reactive to light. Fundoscopy shows sharp discs bilaterally. Extraocular movements intact with no nystagmus. Visual fields full. Facial sensation intact. No facial asymmetry. Tongue, uvula, palate midline.  Motor: Bulk and tone normal, muscle strength 5/5 throughout with no pronator drift.  Sensation to light touch intact.  No extinction to double simultaneous stimulation.  Deep tendon reflexes 2+ throughout, toes downgoing.  Finger to nose testing intact.  Gait narrow-based and steady, able to tandem walk adequately.  Romberg negative.  IMPRESSION: This is a 21 yo RH woman with a history of schizophrenia, with a witnessed convulsion last 08/26/15. Her mother reported episodes concerning for possible seizures as a child (staring spells, stiffening episodes), however she was never started on seizure medication by pediatric neurology. Her 24-hour EEG is normal. MRI brain normal. No further similar symptoms since then. Her main concern on recent visits have been headaches, she continues to have around one headache a week, suggestive of tension-type headaches. She is agreeable to starting a daily headache preventative medication, she will start low dose Topamax 25mg  qhs x 2 weeks, then increase to 50mg  qhs. Side effects were discussed. She has no pregnancy plans and has an IUD. She does not drive and is aware of [place] driving laws to stop driving after an episode of loss of consciousness, until 6 months  event-free. She will follow-up in 3 months and knows to call for any changes.   Thank you for allowing me to participate in her care.  Please do not hesitate to call for any questions or concerns.  The duration of this appointment visit was 25 minutes of face-to-face time with the patient.  Greater than 50% of this time was spent in counseling, explanation of diagnosis, planning of further management, and coordination of care.   Ellouise Newer, M.D.   CC: Dr. Abby Potash

## 2017-02-17 NOTE — Patient Instructions (Addendum)
1. Start Topamax 25mg : Take 1 tablet at night for 2 weeks, then increase to 2 tablets at night 2. Continue all your other medications 3. Keep a calendar of your headaches, follow-up in 3 months

## 2017-02-25 ENCOUNTER — Encounter (HOSPITAL_COMMUNITY): Payer: Self-pay | Admitting: Clinical

## 2017-02-25 ENCOUNTER — Ambulatory Visit (INDEPENDENT_AMBULATORY_CARE_PROVIDER_SITE_OTHER): Payer: Medicaid Other | Admitting: Clinical

## 2017-02-25 DIAGNOSIS — F201 Disorganized schizophrenia: Secondary | ICD-10-CM

## 2017-02-25 NOTE — Progress Notes (Signed)
THERAPIST PROGRESS NOTE  Session Time: 11:00 -11:55  Participation Level: Active  Behavioral Response: NeatAlertNA   Type of Therapy: Individual Therapy  Treatment Goals addressed: Improve Psychiatric Symptoms, regulate mood,  social skills , stress management, reduce hallucinations and delusions,healthy coping skills  Interventions: Motivational Interviewing, CBT, Grounding  Techniques,  Summary: Zaliah Lindy is a 20.yo. female who presents with Schizophrenia, disorganized type     Suicidal/Homicidal: No -without intent/plan  Therapist Response: Maniya met with clinician for an individual session. Lynnleigh discussed her psychiatric symptoms, her current life events and her homework. Clinician asked open ended questions and Crystalina shared that she thought she was doing better with the exception of binge eating. She shared she is taking her medications regularly. Ree has gained a significant amount of weight which she is concerned about. Clinician congratulated her on feeling better and encouraged her to speak to her psychiatrist because she thought it was due to Zyprexa. Clinician asked open ended questions and Oneika shared about her eating habits. She also was able to identify ways she could help regulate he caloric intake - substituting fruits or vegies for junk food. She shared that she has been working to do coloring and going out of the house more. She shared that she watches a bit too much tv especially when bored. Client and clinician discussed the possibility of her socializing more. Clinician taught Bradleigh a new grounding technique which client and clinician practiced together. Client and clinician met briefly with her mother after session to update her on Bellami's progress. Clinician printed some worksheets on coping skills for Tiny  Plan: Return again in 2-3 weeks  Diagnosis:     Axis I: Schizophrenia, disorganized type                                    , A, LCSW 02/25/2017  

## 2017-03-02 ENCOUNTER — Encounter: Payer: Self-pay | Admitting: Pediatrics

## 2017-03-02 ENCOUNTER — Ambulatory Visit (INDEPENDENT_AMBULATORY_CARE_PROVIDER_SITE_OTHER): Payer: Medicaid Other | Admitting: Pediatrics

## 2017-03-02 VITALS — BP 120/70 | Ht 58.47 in | Wt 193.6 lb

## 2017-03-02 DIAGNOSIS — E6609 Other obesity due to excess calories: Secondary | ICD-10-CM | POA: Diagnosis not present

## 2017-03-02 DIAGNOSIS — J302 Other seasonal allergic rhinitis: Secondary | ICD-10-CM

## 2017-03-02 DIAGNOSIS — F509 Eating disorder, unspecified: Secondary | ICD-10-CM

## 2017-03-02 DIAGNOSIS — E669 Obesity, unspecified: Secondary | ICD-10-CM | POA: Insufficient documentation

## 2017-03-02 DIAGNOSIS — R569 Unspecified convulsions: Secondary | ICD-10-CM | POA: Diagnosis not present

## 2017-03-02 DIAGNOSIS — G44209 Tension-type headache, unspecified, not intractable: Secondary | ICD-10-CM | POA: Diagnosis not present

## 2017-03-02 DIAGNOSIS — J4520 Mild intermittent asthma, uncomplicated: Secondary | ICD-10-CM

## 2017-03-02 DIAGNOSIS — K5909 Other constipation: Secondary | ICD-10-CM | POA: Diagnosis not present

## 2017-03-02 DIAGNOSIS — F203 Undifferentiated schizophrenia: Secondary | ICD-10-CM

## 2017-03-02 DIAGNOSIS — Z0001 Encounter for general adult medical examination with abnormal findings: Secondary | ICD-10-CM

## 2017-03-02 DIAGNOSIS — E282 Polycystic ovarian syndrome: Secondary | ICD-10-CM

## 2017-03-02 DIAGNOSIS — Z0289 Encounter for other administrative examinations: Secondary | ICD-10-CM

## 2017-03-02 DIAGNOSIS — E66811 Obesity, class 1: Secondary | ICD-10-CM

## 2017-03-02 LAB — TSH: TSH: 2.67 mIU/L

## 2017-03-02 LAB — T4, FREE: FREE T4: 1.2 ng/dL (ref 0.8–1.4)

## 2017-03-02 MED ORDER — FLUTICASONE PROPIONATE 50 MCG/ACT NA SUSP: 2.0000 | Freq: Two times a day (BID) | NASAL | 11 refills | Status: DC

## 2017-03-02 MED ORDER — ALBUTEROL SULFATE HFA 108 (90 BASE) MCG/ACT IN AERS: 2.0000 | INHALATION_SPRAY | RESPIRATORY_TRACT | 1 refills | Status: DC | PRN

## 2017-03-02 MED ORDER — CETIRIZINE HCL 10 MG PO TABS: 10.0000 mg | ORAL_TABLET | Freq: Every day | ORAL | 11 refills | Status: DC

## 2017-03-02 NOTE — Patient Instructions (Signed)
[facility] Address: [address], [place], [place] [address] Hours: Open ? Closes 5PM Phone: [phone]

## 2017-03-02 NOTE — Progress Notes (Signed)
Adolescent Well Care Visit Natalie Wagner is a 21 y.o. female who is here for well care.    PCP:  Sonakshi Rolland Mcneil Sober, MD   History was provided by the patient.       Current Issues: Current concerns include  Chief Complaint  Patient presents with  . Headache  . Vaginal Bleeding  . Medication Refill   Vaginal Bleeding: Got the IUD November 20th 2017, has had some spotting and discharge since then but very little. She hasn't discussed this with the adolescent pod but has seen them for other issues since then.    Headaches seeing [place] neurology. She states they are the same as before, happens about once or twice a week.  Neurology placed her on topamax 25mg  every night for 2 weeks and instructed to increase to 50mg  after that. That appointment was 13 days ago.  She hasn't started that yet.    Schizophrenia: She is still on the Zyprexa and feels satisfied with that. Sees [facility] every 3 months  Psychiatrist is Dr. Ellouise Newer and the Counselor is Francee Piccolo.    Weight; She is concerned because she is gaining weight again.   She states she is still eating healthy, not active as much as she use to be.    Breast: has had milky discharge and her psychiatrist told her to go to the Ob/Gyn to get it checked. No breast pain.    Nutrition: Nutrition/Eating Behaviors:  She eats eggs or grits for breakfast and orange juice to drink.   AM snack: usually a fruit, water, fruit tea or juice to drink Lunch: fast food places likes burgers and fries Dinner: home cooked meals, can be fried or baked meat, has a vegetable and a starch. Water, tea or soda to drink  PM Snack: Cookies, Ice Cream.    Adequate calcium in diet?: lactose free milk with cereal, no cheese or yogurt  Supplements/ Vitamins:  No   Exercise/ Media: Play any Sports?/ Exercise: no   Sleep:  Sleep: 8:30-10pm is bedtime she often wakes up to eat   Social Screening: Lives with:  Lives  with mom and brothers  Parental relations:  good  Menstruation:   No LMP recorded (lmp unknown). Patient is not currently having periods (Reason: IUD). Menstrual History: no true periods since the IUD was place   Confidential Social History: Tobacco?  no Secondhand smoke exposure?  no Drugs/ETOH?  no  Sexually Active?  no   Pregnancy Prevention: IUD and abstinence   Physical Exam:  Vitals:   03/02/17 1337  BP: 120/70  Weight: 193 lb 9.6 oz (87.8 kg)  Height: 4' 10.47" (1.485 m)   BP 120/70   Ht 4' 10.47" (1.485 m)   Wt 193 lb 9.6 oz (87.8 kg)   LMP  (LMP Unknown)   BMI 39.82 kg/m  Body mass index: body mass index is 39.82 kg/m. Growth percentile SmartLinks can only be used for patients less than 53 years old.  No exam data present  Wt Readings from Last 3 Encounters:  03/02/17 193 lb 9.6 oz (87.8 kg)  02/17/17 191 lb 12.8 oz (87 kg)  01/28/17 189 lb (85.7 kg)    General Appearance:   alert, oriented, no acute distress and obese  HENT: Normocephalic, no obvious abnormality, conjunctiva clear  Mouth:   Normal appearing teeth, no obvious discoloration, dental caries, or dental caps  Neck:   Supple; thyroid: no enlargement, symmetric, no tenderness/mass/nodules  Chest Breast was tanner 5, no tenderness,no lumps, no lesions and no discharge   Lungs:   Clear to auscultation bilaterally, normal work of breathing  Heart:   Regular rate and rhythm, S1 and S2 normal, no murmurs;   Abdomen:   Soft, non-tender, no mass, or organomegaly     Assessment and Plan:      1. Encounter for other administrative examinations Gave her phone number for Family Medicine practice to transition care, she called right when she left the office and has an appointment in a couple of weeks.   Saw her today to follow-up since she has a lot of chronic diseases.  Since our last visit she has had an IUD placed, has been on Zyprexa for over 6 months now and likes it better than other  medications and is getting along with her mom and brother.  She was diagnosed with an eating disorder at that visit and was seen by nutrition but denied any issues at that visit.    2. Eating disorder In August we did the EAT 26 which was positive for an eating disorder and she had very disorganized eating habits( sleeping most of the day and eating snacks instead of full meals) she   3. Tension-type headache, not intractable, unspecified chronicity pattern Followed by [facility], hasn't started the Topamax like instructed. Will start it   4. Undifferentiated schizophrenia ([place]) Doing well on Zyprexa, she is calmer and easier to follow.  She is getting along with family and keeping up with her appointments.  I am satisfied with this regimen   5. Class 1 obesity due to excess calories without serious comorbidity in adult, unspecified BMI She gained 81 pounds in 7 months discussed healthy habits, suggested a weight management clinic but she wants to try things herself 1st  I set up her MyFitness pal to help log her calorie intake with a goal to lose one pound weekly  - Lipid panel - Hemoglobin A1c - Comprehensive metabolic panel - T4, free - VITAMIN D 25 Hydroxy (Vit-D Deficiency, Fractures) - TSH  6. Other constipation Has miralax   7. Seasonal allergic rhinitis, unspecified trigger - cetirizine (ZYRTEC) 10 MG tablet; Take 1 tablet (10 mg total) by mouth at bedtime.  Dispense: 30 tablet; Refill: 11 - fluticasone (FLONASE) 50 MCG/ACT nasal spray; Place 2 sprays into both nostrils 2 (two) times daily.  Dispense: 16 g; Refill: 11  8. PCOS (polycystic ovarian syndrome) Sees Adolescent pod for that   9. Convulsions, unspecified convulsion type ([place]) Sees [facility] neurology        No Follow-up on file.Sarajane Jews, MD

## 2017-03-03 LAB — LIPID PANEL
CHOLESTEROL: 193 mg/dL (ref <200)
HDL: 40 mg/dL — ABNORMAL LOW (ref >50)
LDL CALC: 137 mg/dL — AB (ref <100)
Total CHOL/HDL Ratio: 4.8 Ratio (ref <5.0)
Triglycerides: 80 mg/dL (ref <150)
VLDL: 16 mg/dL (ref <30)

## 2017-03-03 LAB — HEMOGLOBIN A1C
HEMOGLOBIN A1C: 5.3 % (ref <5.7)
MEAN PLASMA GLUCOSE: 105 mg/dL

## 2017-03-03 LAB — COMPREHENSIVE METABOLIC PANEL
ALK PHOS: 106 U/L (ref 33–115)
ALT: 12 U/L (ref 6–29)
AST: 15 U/L (ref 10–30)
Albumin: 4.2 g/dL (ref 3.6–5.1)
BUN: 12 mg/dL (ref 7–25)
CALCIUM: 9.6 mg/dL (ref 8.6–10.2)
CO2: 24 mmol/L (ref 20–31)
Chloride: 103 mmol/L (ref 98–110)
Creat: 0.88 mg/dL (ref 0.50–1.10)
GLUCOSE: 90 mg/dL (ref 65–99)
POTASSIUM: 4.6 mmol/L (ref 3.5–5.3)
SODIUM: 139 mmol/L (ref 135–146)
Total Bilirubin: 0.4 mg/dL (ref 0.2–1.2)
Total Protein: 7.3 g/dL (ref 6.1–8.1)

## 2017-03-03 LAB — VITAMIN D 25 HYDROXY (VIT D DEFICIENCY, FRACTURES): Vit D, 25-Hydroxy: 19 ng/mL — ABNORMAL LOW (ref 30–100)

## 2017-03-09 ENCOUNTER — Ambulatory Visit (INDEPENDENT_AMBULATORY_CARE_PROVIDER_SITE_OTHER): Payer: Medicaid Other | Admitting: Family Medicine

## 2017-03-09 VITALS — BP 122/70 | HR 100 | Temp 98.3°F | Ht <= 58 in | Wt 189.6 lb

## 2017-03-09 DIAGNOSIS — N939 Abnormal uterine and vaginal bleeding, unspecified: Secondary | ICD-10-CM | POA: Diagnosis not present

## 2017-03-09 DIAGNOSIS — G44209 Tension-type headache, unspecified, not intractable: Secondary | ICD-10-CM

## 2017-03-09 DIAGNOSIS — Z7689 Persons encountering health services in other specified circumstances: Secondary | ICD-10-CM

## 2017-03-09 DIAGNOSIS — R519 Headache, unspecified: Secondary | ICD-10-CM | POA: Insufficient documentation

## 2017-03-09 DIAGNOSIS — R51 Headache: Secondary | ICD-10-CM

## 2017-03-09 NOTE — Assessment & Plan Note (Signed)
  New, since getting IUD placed  -likely normal vaginal bleeding that is expected with new hormonal birth control -reassured patient -will follow up in 2 weeks and do pelvic exam at this time, did not have time to do so at new patient encounter

## 2017-03-09 NOTE — Assessment & Plan Note (Signed)
  Chronic, persistent, has seen neurology in past  -advised to start taking Topamax as instructed by neurology -follow up in 2 weeks to see how she is doing on this medication

## 2017-03-09 NOTE — Patient Instructions (Signed)
  It was great meeting you today!  Please start taking the Topamax that your neurologist prescribed. We will see how this is working for you at your next visit.  Please continue taking a multivitamin daily OR if you would like to just take Vitamin D alone, 1000 units of Vitamin D daily would be a good place to start.  I'll see you back in 2 weeks!

## 2017-03-09 NOTE — Progress Notes (Signed)
    Subjective:    Patient ID: Natalie Wagner, female    DOB: 1996-02-23, 21 y.o.   MRN: [id number]   CC: establish care  Sees psychiatrist every 3 months, Dr. [name] Therapist Dr. Florene Glen  Has been referred to Neurology for headaches  PMH- headaches, vaginal discharge, nipple discharge, schizophrenia, PCOS? Allergies,   meds- benztropine (BID) (stopped taking), zyprexa (every night 20mg ), melatonin, tylenol as needed, women's one a day vitamin, IUD (in since Nov/Dec) Allergies- NKDA Surg Hx- redundant labia age 89? biospy on finger left ring finger FH- mom has schizophrenia SH- lives with mom and brothers, planning on going to [place]. No cigarettes, no alcohol, no drugs   Would like to address the following complaints Headaches- unsure if this from IUD. Is followed by neurology and was told to start topamax. Did not start yet, picked up this medication, but was unsure what side effects would be so did not start this medication  Vaginal bleeding- has had abnormal bleeding since IUD put it, now has spotting. Is worried about this. Feels like she has had abnormal discharge related to the spotting.   Asthma- needs albuterol inhaler. PCP prescribed it 2 weeks ago but she does not think the pharmacy has it yet  Pre-diabetes- had blood work recently, saw on my chart that her a1c was in pre-diabetes range. Is worried. Has taken metformin in the past and does not want to ever take it again, does not like swallowing pills, was able to wean off it with diet and exercise  Nipple discharge- has been taking zyprexa. Had elevated prolactin level. Was told she needs further testing for this.    Review of Systems- see HPI  Objective:  BP 122/70   Pulse 100   Temp 98.3 F (36.8 C) (Oral)   Ht 4\' 10"  (1.473 m)   Wt 189 lb 9.6 oz (86 kg)   LMP  (LMP Unknown)   SpO2 99%   BMI 39.63 kg/m  Vitals and nursing note reviewed  General: well nourished, in no acute distress Cardiac: RRR, clear S1  and S2, no murmurs, rubs, or gallops Respiratory: clear to auscultation bilaterally, no increased work of breathing Abdomen: obese abdomen, soft, nontender, nondistended, no masses or organomegaly. Bowel sounds present Extremities: no edema or cyanosis Skin: warm and dry, no rashes noted Neuro: alert and oriented, no focal deficits Psych: scattered thoughts, tangential speech. Appropriate affect.   Labs and studies reviewed from prior encounters from psychiatry, neurology, pediatrician, and adolescent medicine.   Assessment & Plan:    Tension-type headache, not intractable  Chronic, persistent, has seen neurology in past  -advised to start taking Topamax as instructed by neurology -follow up in 2 weeks to see how she is doing on this medication  Vaginal bleeding  New, since getting IUD placed  -likely normal vaginal bleeding that is expected with new hormonal birth control -reassured patient -will follow up in 2 weeks and do pelvic exam at this time, did not have time to do so at new patient encounter    Return in about 2 weeks (around 03/23/2017).   Lucila Maine, DO Family Medicine Resident PGY-1

## 2017-03-22 ENCOUNTER — Encounter: Payer: Self-pay | Admitting: Family Medicine

## 2017-03-22 ENCOUNTER — Ambulatory Visit (INDEPENDENT_AMBULATORY_CARE_PROVIDER_SITE_OTHER): Payer: Medicaid Other | Admitting: Family Medicine

## 2017-03-22 VITALS — BP 110/64 | HR 92 | Temp 98.4°F | Ht <= 58 in | Wt 193.4 lb

## 2017-03-22 DIAGNOSIS — G44209 Tension-type headache, unspecified, not intractable: Secondary | ICD-10-CM | POA: Diagnosis not present

## 2017-03-22 DIAGNOSIS — N898 Other specified noninflammatory disorders of vagina: Secondary | ICD-10-CM | POA: Insufficient documentation

## 2017-03-22 DIAGNOSIS — N6452 Nipple discharge: Secondary | ICD-10-CM | POA: Insufficient documentation

## 2017-03-22 DIAGNOSIS — K581 Irritable bowel syndrome with constipation: Secondary | ICD-10-CM | POA: Diagnosis not present

## 2017-03-22 LAB — POCT WET PREP (WET MOUNT)
Clue Cells Wet Prep Whiff POC: NEGATIVE
TRICHOMONAS WET PREP HPF POC: ABSENT

## 2017-03-22 NOTE — Assessment & Plan Note (Addendum)
  Improving. Does not bother patient much. On zyprexa, prolactin 34  -advised to continue zyprexa -follow up if worsens or becomes bothersome

## 2017-03-22 NOTE — Assessment & Plan Note (Addendum)
  New. Has noticed this since IUD placed, denies sexual activity. No pelvic pain.  -wet prep today negative for BV, yeast, or trich -advised patient to avoid excessive cleaning  -wash with just soap and water

## 2017-03-22 NOTE — Assessment & Plan Note (Signed)
  Chronic problem, worse with lactose containing foods  -advised patient to avoid lactose -keep food diary when cramping occurs to identify foods that exacerbate IBS -can use tylenol or motrin as needed for discomfort -continue miralax for constipation

## 2017-03-22 NOTE — Patient Instructions (Addendum)
  Try liquid ibuprofen (may be called children's motrin) for cramping.  Keep a food diary when you have cramping- maybe you'll notice a pattern that a certain kind of food upsets your stomach and causes cramping.  I'll call you with the results from today's test  If you have questions or concerns please do not hesitate to call at [phone].  Lucila Maine, DO PGY-1, [facility] 03/22/2017 11:55 AM

## 2017-03-22 NOTE — Progress Notes (Signed)
    Subjective:    Patient ID: Natalie Wagner, female    DOB: 1996/05/05, 21 y.o.   MRN: [id number]   CC: follow up unsure why  Headaches- not taking Topamax still. States she intends to take it at night but falls asleep before taking it. Still has headaches frequently. She is unsure if she can take tylenol. Denies any nausea, vomiting, aura before headache, vision changes. Follow up with neuro scheduled for end of July.  Vaginal discharge- states this is yellow and thick, wonders if it's related to her IUD. Denies being sexually active. She denies any pain. She states she cleans herself very thoroughly "down there" and shaves and "keeps things trim". Denies using douche.  Nipple discharge- has improved but still has 1-2 small spots a day bilaterally. No pains. No blood.  Vaginal bleeding- has improved. Had a "period" last week that lasted 2-3 days. Hoping bleeding continues to decrease.  Cramping- occasionally has cramping, is lactose intolerant and notices it after eating dairy foods especially. Has been dx with IBS before. Notes cramping improves with bowel movement. Denies diarrhea, has constipation at baseline.   Smoking status reviewed- non-smoker  Review of Systems-see HPI  Objective:  BP 110/64   Pulse 92   Temp 98.4 F (36.9 C) (Oral)   Ht 4\' 10"  (1.473 m)   Wt 193 lb 6.4 oz (87.7 kg)   LMP  (LMP Unknown)   SpO2 97%   BMI 40.42 kg/m  Vitals and nursing note reviewed  General: well nourished, in no acute distress GU: normal external female genitalia, no rashes or lesions noted. Moderate amount of white-yellow discharge noted.  Skin: warm and dry, no rashes noted Neuro: alert and oriented, no focal deficits  Assessment & Plan:    Nipple discharge  Improving. Does not bother patient much. On zyprexa, prolactin 34  -advised to continue zyprexa -follow up if worsens or becomes bothersome  Tension-type headache, not intractable  Persistent.   -advised  patient to take Topamax as instructed by neuro -follow up with neuro July 31 -can use tylenol as needed for headaches as well  Irritable bowel syndrome  Chronic problem, worse with lactose containing foods  -advised patient to avoid lactose -keep food diary when cramping occurs to identify foods that exacerbate IBS -can use tylenol or motrin as needed for discomfort -continue miralax for constipation  Vaginal discharge  New. Has noticed this since IUD placed, denies sexual activity. No pelvic pain.  -wet prep today negative for BV, yeast, or trich -advised patient to avoid excessive cleaning  -wash with just soap and water    Return in about 1 year (around 03/22/2018), or if symptoms worsen or fail to improve.   Lucila Maine, DO Family Medicine Resident PGY-1

## 2017-03-22 NOTE — Assessment & Plan Note (Addendum)
  Persistent.   -advised patient to take Topamax as instructed by neuro -follow up with neuro July 31 -can use tylenol as needed for headaches as well

## 2017-03-23 ENCOUNTER — Ambulatory Visit (INDEPENDENT_AMBULATORY_CARE_PROVIDER_SITE_OTHER): Payer: Medicaid Other | Admitting: Clinical

## 2017-03-23 ENCOUNTER — Encounter (HOSPITAL_COMMUNITY): Payer: Self-pay | Admitting: Clinical

## 2017-03-23 DIAGNOSIS — F201 Disorganized schizophrenia: Secondary | ICD-10-CM

## 2017-03-23 NOTE — Progress Notes (Signed)
   THERAPIST PROGRESS NOTE  Session Time: 1:33 - 2:28   Participation Level: Active  Behavioral Response: CasualAlertNA  Type of Therapy: Individual Therapy  Treatment Goals addressed: Improve Psychiatric Symptoms, regulate mood, improve unhelpful thought patterns,  healthy coping skills  Interventions: Motivational Interviewing, CBT,   Summary: Natalie Wagner is a 57.yo. female who presents with Schizophrenia, disorganized type     Suicidal/Homicidal: No -without intent/plan  Therapist Response: Natalie Wagner met with clinician for an individual session. Natalie Wagner discussed her psychiatric symptoms, her current life events and her homework. Natalie Wagner shared that she has had some good days and some challenging days. She shared her medication is working well.  Clinician asked open ended questions and Natalie Wagner shared that she has continued to struggle with binge eating (due to medication) but that she has been diagnosed with IBS. She shared about the challenges of the symptoms. She shared that she has been working on eating healthier foods. Client and clinician discussed when this is most challenging for her - when she is bored. Clinician gave Natalie Wagner a work sheet - a simplified Human resources officer. Clicician asked open ended questions and [name] filled out the worksheets. She was able to formulate healthier alternative thoughts when she is bored. Client and clinician discussed how her behaviors might change if her thoughts changed. Natalie Wagner shared she had completed her homework packet. Client and clinician reviewed and discussed her thoughts and insights.  Plan: Return again in 1- 2 weeks  Diagnosis:     Axis I: Schizophrenia, disorganized type      Dany Harten A, LCSW 03/23/2017

## 2017-04-02 ENCOUNTER — Ambulatory Visit: Payer: Self-pay | Admitting: Pediatrics

## 2017-04-21 ENCOUNTER — Encounter (HOSPITAL_COMMUNITY): Payer: Self-pay | Admitting: Clinical

## 2017-04-21 ENCOUNTER — Ambulatory Visit (INDEPENDENT_AMBULATORY_CARE_PROVIDER_SITE_OTHER): Payer: Medicaid Other | Admitting: Clinical

## 2017-04-21 DIAGNOSIS — F201 Disorganized schizophrenia: Secondary | ICD-10-CM | POA: Diagnosis not present

## 2017-04-21 NOTE — Progress Notes (Addendum)
   THERAPIST PROGRESS NOTE  Session Time: 3:33 - 4:27  Participation Level: Active  Behavioral Response: CasualAlertDepressed   Type of Therapy: Individual Therapy  Treatment Goals addressed: Improve Psychiatric Symptoms, regulate mood, improve unhelpful thought patterns, learn about diagnosis, healthy coping skills  Interventions: Motivational Interviewing, CBT, Grounding & Mindfulness Techniques, psychoeducation  Summary: Natalie Wagner is a 89.yo. female who presents with Schizophrenia, disorganized type     Suicidal/Homicidal: No -without intent/plan  Therapist Response: Natalie Wagner met with clinician for an individual session. Natalie Wagner discussed her psychiatric symptoms, her current life events and her homework. Natalie Wagner shared that she was doing okay for the most part. She shared that she continues to gain a lot of weight due to the hunger she experiences due to the Zyprexa. She shared that she found her self over eating. Clinician asked open ended questions and Natalie Wagner identified some strategies to eat less compulsively. Client and clinician reviewed her homework packet (they are out of her reading range but she asks to continue with them because gets a little out of them - clinician also gives her simpler worksheets) Clinician informed Natalie Wagner that clinician will be leaving the practice. Clinician asked open ended questions and Natalie Wagner shared her thoughts and emotions about it. Client and clinician discussed options for transitioning to another clinician. Client and clinician discussed her diagnosis and symptoms. Clinician encouraged her to contact [facility] and start investigating that possibility for when she turns 21 in the fall. Client and clinician reviewed and discussed healthy coping skills.  Plan: Return again in 1- 2 weeks  Diagnosis:     Axis I: Schizophrenia, disorganized type      Natalie Wagner A, LCSW 04/21/2017

## 2017-04-26 ENCOUNTER — Other Ambulatory Visit (HOSPITAL_COMMUNITY): Payer: Self-pay | Admitting: Psychiatry

## 2017-04-26 DIAGNOSIS — F201 Disorganized schizophrenia: Secondary | ICD-10-CM

## 2017-05-03 ENCOUNTER — Telehealth: Payer: Self-pay | Admitting: Family Medicine

## 2017-05-03 NOTE — Telephone Encounter (Signed)
Pt needs a referral for a GI. Pt has been going to a pediatric GI for awhile now for stomach issues. ep

## 2017-05-03 NOTE — Telephone Encounter (Signed)
Will forward to MD to advise. Jaloni Sorber,CMA  

## 2017-05-04 ENCOUNTER — Other Ambulatory Visit: Payer: Self-pay | Admitting: Family Medicine

## 2017-05-04 ENCOUNTER — Encounter: Payer: Self-pay | Admitting: Gastroenterology

## 2017-05-04 DIAGNOSIS — R1084 Generalized abdominal pain: Secondary | ICD-10-CM

## 2017-05-04 NOTE — Telephone Encounter (Signed)
Referral made 

## 2017-05-04 NOTE — Telephone Encounter (Signed)
Patient is aware of referral. Harper Smoker,CMA

## 2017-05-06 ENCOUNTER — Encounter (HOSPITAL_COMMUNITY): Payer: Self-pay | Admitting: Psychiatry

## 2017-05-06 ENCOUNTER — Ambulatory Visit (INDEPENDENT_AMBULATORY_CARE_PROVIDER_SITE_OTHER): Payer: Medicaid Other | Admitting: Psychiatry

## 2017-05-06 DIAGNOSIS — F913 Oppositional defiant disorder: Secondary | ICD-10-CM | POA: Diagnosis not present

## 2017-05-06 DIAGNOSIS — G4700 Insomnia, unspecified: Secondary | ICD-10-CM

## 2017-05-06 DIAGNOSIS — E739 Lactose intolerance, unspecified: Secondary | ICD-10-CM

## 2017-05-06 DIAGNOSIS — F201 Disorganized schizophrenia: Secondary | ICD-10-CM | POA: Diagnosis not present

## 2017-05-06 DIAGNOSIS — Z79899 Other long term (current) drug therapy: Secondary | ICD-10-CM | POA: Diagnosis not present

## 2017-05-06 DIAGNOSIS — Z793 Long term (current) use of hormonal contraceptives: Secondary | ICD-10-CM

## 2017-05-06 MED ORDER — OLANZAPINE 20 MG PO TABS: 20.0000 mg | ORAL_TABLET | Freq: Every day | ORAL | 3 refills | Status: DC

## 2017-05-06 NOTE — Progress Notes (Signed)
BH MD/PA/NP OP Progress Note  05/06/2017 10:07 AM Natalie Wagner  MRN:  [id number]  Chief Complaint:  Chief Complaint    Follow-up     HPI: Here with mom.   Pt states she is taking her meds and was prescribed Topamax for headaches.    Pt reports she is eating all day. She is eating a lot of snacks and carbs. She will eat a large packet of cookies for the family in 2 days.  Mood is good. She denies depression and irritability. Pt denies worthlessness and hopelessness. She denies SI/HI.  She spends a lot of time watching tv.  Sleep is good but she will wake up at 2 am and eat a snack and then goes back to bed.  Pt denies AVH, paranoia and ideas of reference.  Mom states schizophrenia is much improved. Mom has not seen talking to herself as much. She has heard the patient talking to herself once or twice a day as opposed to all day.  Visit Diagnosis:    ICD-10-CM   1. 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 Asthma    prn inhaler  . Constipation 05/05/2013  . Difficulty swallowing pills   . Elevated prolactin level ([place]) 12/27/2014  . Galactorrhea 12/27/2014  . History of seizures as a child    mother states were triggered by migraines; no seizures in > 7 yr.  . 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 Migraines   . Schizophrenia [facility])     Past Surgical History:  Procedure Laterality Date  . COLONOSCOPY    . MRI     under anesthesia  . 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 UMBILICAL HERNIA REPAIR      Family Psychiatric and Medical History: Family History  Problem Relation Age of Onset  . Diabetes Maternal Grandfather   . Hypertension Maternal Grandfather   . Heart disease Maternal Grandfather   . Kidney disease Maternal Grandfather   . Anesthesia problems Maternal Grandfather        hx. of being hard to wake up post-op  . Hypertension Maternal Grandmother   . Stroke Maternal Grandmother   . Asthma Mother   . Hypertension Maternal Uncle     Social History:  Social History   Social History  . Marital status: Single    Spouse name: N/A  . Number of children: N/A  . Years of education: N/A   Social History Main Topics  . Smoking status: Never Smoker  . Smokeless tobacco: Never Used  . Alcohol use No  . Drug use: No  . Sexual activity: No   Other Topics Concern  . Not on file   Social History Narrative  . No narrative on file    Allergies:  Allergies  Allergen Reactions  . Lactose Intolerance (Gi) Diarrhea    Metabolic Disorder Labs: Lab Results  Component Value Date  HGBA1C 5.3 03/02/2017   MPG 105 03/02/2017   MPG 94 07/27/2016   Lab Results  Component Value Date   PROLACTIN 34.0 (H) 11/21/2016   PROLACTIN 25.7 12/19/2014   Lab Results  Component Value Date   CHOL 193 03/02/2017   TRIG 80 03/02/2017   HDL 40 (L) 03/02/2017   CHOLHDL 4.8 03/02/2017   VLDL 16 03/02/2017   LDLCALC 137 (H) 03/02/2017   LDLCALC 104 07/27/2016     Current Medications: Current Outpatient Prescriptions  Medication Sig Dispense Refill  . albuterol (PROAIR HFA) 108 (90 Base) MCG/ACT inhaler Inhale 2 puffs into the lungs every 4 (four) hours as needed for wheezing or shortness of breath. 1 Inhaler 1  . benztropine (COGENTIN) 1 MG tablet TAKE 1 TABLET BY MOUTH TWICE A DAY 8 tablet 0  . cetirizine (ZYRTEC) 10 MG tablet  Take 1 tablet (10 mg total) by mouth at bedtime. 30 tablet 11  . fluticasone (FLONASE) 50 MCG/ACT nasal spray Place 2 sprays into both nostrils 2 (two) times daily. 16 g 11  . levonorgestrel (MIRENA) 20 MCG/24HR IUD 1 Intra Uterine Device (1 each total) by Intrauterine route once. 1 each 0  . Melatonin 5 MG TABS Take 1 tablet by mouth daily as needed (sleep).     . OLANZapine (ZYPREXA) 20 MG tablet Take 1 tablet (20 mg total) by mouth at bedtime. 90 tablet 3  . Olopatadine HCl (PAZEO) 0.7 % SOLN Place 1 mL into both eyes daily as needed (allergies).     . polyethylene glycol powder (GLYCOLAX/MIRALAX) powder TAKE 17 GM(1 CAPFUL) ONCE DAILY 255 g 5  . topiramate (TOPAMAX) 25 MG tablet Take 1 tablet at night for 2 weeks, then increase to 2 tablets at night 60 tablet 6   No current facility-administered medications for this visit.     Musculoskeletal: Strength & Muscle Tone: within normal limits Gait & Station: normal Patient leans: N/A  Psychiatric Specialty Exam: Review of Systems  HENT: Positive for sore throat. Negative for congestion, nosebleeds and sinus pain.   Neurological: Negative for dizziness, tingling, tremors and headaches.  Psychiatric/Behavioral: Negative for depression, hallucinations, substance abuse and suicidal ideas. The patient is not nervous/anxious and does not have insomnia.     Blood pressure 122/74, pulse (!) 112, height 4\' 10"  (1.473 m), weight 191 lb 3.2 oz (86.7 kg).Body mass index is 39.96 kg/m.  General Appearance: Casual  Eye Contact:  Good  Speech:  Clear and Coherent and Slow  Volume:  Decreased  Mood:  Euthymic  Affect:  Flat  Thought Process:  Coherent and Descriptions of Associations: Intact  Orientation:  Full (Time, Place, and Person)  Thought Content: Logical   Suicidal Thoughts:  No  Homicidal Thoughts:  No  Memory:  Immediate;   Good Recent;   Good Remote;   Good  Judgement:  Intact  Insight:  Shallow  Psychomotor Activity:  Normal   Concentration:  Concentration: Fair and Attention Span: Fair  Recall:  [employer] of Knowledge: Fair  Language: Fair  Akathisia:  No  Handed:  Right  AIMS (if indicated):  n/a  Assets:  Communication Skills Desire for Improvement Housing Social Support  ADL's:  Intact  Cognition: WNL  Sleep:  good     Treatment Plan Summary:Medication management   Assessment: Schizophrenia- undifferentiated type; ODD; Insomnia   Medication management with supportive therapy. Risks/benefits and SE of the medication discussed. Pt verbalized understanding and verbal consent obtained for treatment.  Affirm with the  patient that the medications are taken as ordered. Patient expressed understanding of how their medications were to be used.   Meds: Zyprexa 20mg  po qHS for schizophrenia Cogentin 1mg  po BID to prevent EPS We discussed how weight gain can be a SE of Zyprexa. Labs are WNL and no signs of metabolic issues despite weight gain. Pt declined med change and would prefer to diet to lose weight rather than switch off Zyprexa.    Labs: 03/02/2017 CMP WNL, LDL 137, HbA1c WNL, TSH WNL   Therapy: brief supportive therapy provided. Discussed psychosocial stressors in detail.   Discussed weight loss and diet  Consultations:  None  Pt denies SI and is at an acute low risk for suicide. Patient told to call clinic if any problems occur. Patient advised to go to ER if they should develop SI/HI, side effects, or if symptoms worsen. Has crisis numbers to call if needed. Pt verbalized understanding.  F/up in 3 months or sooner if needed    [name], MD 05/06/2017, 10:07 AM

## 2017-05-13 ENCOUNTER — Telehealth (HOSPITAL_COMMUNITY): Payer: Self-pay

## 2017-05-13 ENCOUNTER — Ambulatory Visit (INDEPENDENT_AMBULATORY_CARE_PROVIDER_SITE_OTHER): Payer: Medicaid Other | Admitting: Clinical

## 2017-05-13 ENCOUNTER — Encounter (HOSPITAL_COMMUNITY): Payer: Self-pay | Admitting: Clinical

## 2017-05-13 DIAGNOSIS — F201 Disorganized schizophrenia: Secondary | ICD-10-CM | POA: Diagnosis not present

## 2017-05-13 NOTE — Progress Notes (Signed)
   THERAPIST PROGRESS NOTE  Session Time: 4:25- 5:10   Participation Level: Active  Behavioral Response: CasualAlertNA   Type of Therapy: Individual Therapy  Treatment Goals addressed: Improve Psychiatric Symptoms,  social skills ,  healthy coping skills  Interventions: Motivational Interviewing, CBT,   Summary: Natalie Wagner is a 61.yo. female who presents with Schizophrenia, disorganized type     Suicidal/Homicidal: No -without intent/plan  Therapist Response: Emarie met with clinician for an individual session. Alejandra discussed her psychiatric symptoms, her current life events and her homework. Tanika shared she has been feeling a little bit bettter. She shared she is concerned about her weight gain. She is working to eat healthier foods but finds herself eating all the time none the less. Client and clinician discussed her weight gain. ( after session she stopped by the nurses office to discuss possibility of an appetite suppressant). Client and clinician reviewed and discussed her homework packet. Tameeka seemed to have understood it better that other in the past. Client and clinician agreed that aside from her weight gain the Zyprexa is working really well.  Part of Aleja's homework had her write a compassionate letter to herself. She read the letter to clinician. Client and clinician discussed how the letter made her feel. She shared that when she spoke to herself compassionately she felt calmer and happier. Sabriel shared that she would like to go to college in the future. Clinician asked open ended questions and Aleeta shared her thoughts about school and improvements she would like to make before attending. Client and clinician discussed the social skills she might want to improve and how to practice doing so.Client and clinician discussed the progress she has made and reviewed skills and tools. Client and clinician said good byes.  Plan: Return again in 2-3  weeks  Diagnosis:     Axis I: Schizophrenia, disorganized type      Catalyna Reilly A, LCSW 05/13/2017

## 2017-05-13 NOTE — Telephone Encounter (Signed)
Medication problem - Patient in today with Natalie Wagner, therapist as she reported concern for continued weight gain. Pt. and therapist report she is doing wonderful and Zyprexa and patient is happy with the medication but is concerned about weight.   Patient stated Dr. [name] offered to try her on Metformin but she did not like the way this medication made her feel in the past and questions if she can try something else.  Questioned with therapist if Dr. [name] would support her trying Phentermine and if so would then discuss this with patient's Mother.  Patient was 115 pound 05/14/16 and has gained 76 pounds since, as is now 191 pounds today.  Patient requests follow up with her and her mother concerning her weight and any recommendations.

## 2017-05-14 NOTE — Telephone Encounter (Signed)
Not phentermine because its a stimulant and quite dopaminergic, would contradict the psychiatric benefits of zyprexa.  We sometimes use wellbutrin/naltrexone combination but that would be a change she should discuss with Dr. [name]. She may want to consider a switch of her antipsychotic, and discuss some options with Dr. [name].

## 2017-05-21 NOTE — Telephone Encounter (Signed)
Medication management. - Telephone call with patient to inform it was not recommended for patient to try phentermine as we had previously discussed to loose weight.  Agreed to have Dr. [name] follow up with her on possibilities to help upon her return as it out this week and patient agreed with plan.

## 2017-06-08 ENCOUNTER — Ambulatory Visit (INDEPENDENT_AMBULATORY_CARE_PROVIDER_SITE_OTHER): Payer: Medicaid Other | Admitting: Neurology

## 2017-06-08 ENCOUNTER — Encounter: Payer: Self-pay | Admitting: Neurology

## 2017-06-08 VITALS — BP 126/74 | HR 75 | Ht <= 58 in | Wt 191.0 lb

## 2017-06-08 DIAGNOSIS — G44209 Tension-type headache, unspecified, not intractable: Secondary | ICD-10-CM

## 2017-06-08 DIAGNOSIS — R569 Unspecified convulsions: Secondary | ICD-10-CM | POA: Diagnosis not present

## 2017-06-08 MED ORDER — TOPIRAMATE 25 MG PO TABS: ORAL_TABLET | ORAL | 3 refills | Status: DC

## 2017-06-08 NOTE — Progress Notes (Signed)
NEUROLOGY FOLLOW UP OFFICE NOTE  Natalie Wagner [id number]  HISTORY OF PRESENT ILLNESS: I had the pleasure of seeing Natalie Wagner in follow-up in the neurology clinic on 06/08/2017. She is again accompanied by her mother who helps supplement the history today. The patient was last seen 3 months ago for headaches and new onset seizure last 08/26/15. Her mother reported concern for seizures as an infant, but she was never started on seizure medication. Imaging and 24-hour EEG were normal. No further seizures with loss of consciousness or shaking spells since October 2016. She was reporting frequent headaches around once a week, and was started on Topamax on her last visit. She is taking Topamax 25mg  qhs and reports an improvement in headaches, last headache was a month ago. She denies any side effects to Topamax. She had gained weight on Zyprexa, and is happy to report she has not gained weight since her last visit. She continues on Zyprexa and Cogentin. Her mother denies any staring spells or convulsions. She is reporting drowsiness with her medications. No falls.  HPI: This is a 21 yo RH woman with a history of schizophrenia who presented after a seizure last 08/26/15. She recalls waking up then starting to feel dizzy and lightheaded, then heard her mother screaming and waking up on the couch. Her mother reports she lost consciousness and started shaking, rocking side to side. No tongue bite or incontinence. She was brought to [facility] ER where CBC and BMP were normal, no urine drug screen done. I personally reviewed head CT without contrast which was normal. Her mother reports she was born premature at 6 weeks and had delayed development with occupation and speech therapy as a child. She was in special education classes. As a baby, she would have episodes where she would get stiff as a board and shake, then in the 2nd grade she passed out with shaking and incontinence. She also had brief staring spells  occurring around 3 times a week. She had seen pediatric neurologist Dr. Gaynell Face at that time, mother is unsure of diagnosis but denies any treatment for seizures. There is note that symptoms were felt to be due to migraines. The last staring spell was in the 7th grade. She was diagnosed with schizophrenia in the 9th grade, she was having visual hallucinations and would forget who she was, suddenly drinking from a fountain and spitting it out. She was admitted to inpatient psychiatry for 2 weeks and has been taking Zyprexa for the past 3 years. She denies any more hallucinations, but her mother is concerned about "strange behaviors" the past month. In the waiting room today, she was staring at the form and her mother told her to write something down. She then wrote over what her mother had filled out. She talks as if there is a third person in the room, sometimes she "just talks and does not know it." She closes and opens doors repetitively. She lives with her mother and denies any alcohol intake. She reports good sleep.   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 There is no history of febrile convulsions, CNS infections such as meningitis/encephalitis, significant traumatic brain injury, neurosurgical procedures, or family history of seizures.  Diagnostic Data: MRI brain with and without contrast done 01/14/15 for galactorrhea. I personally reviewed images, no acute changes, hippocampi symmetric with no abnormal signal or enhancement seen. Pituitary gland normal. EEG as above  I personally reviewed MRI brain with and without contrast  10/21/15 which was normal, hippocampi symmetric with no abnormal signal or enhancement seen. Her 1-hour sleep-deprived EEG, as well as 24-hour EEG were normal. Typical events were not captured.    PAST MEDICAL HISTORY: Past Medical History:  Diagnosis Date  . Asthma    prn inhaler  . Constipation 05/05/2013  . Difficulty swallowing pills     . Elevated prolactin level ([place]) 12/27/2014  . Galactorrhea 12/27/2014  . History of seizures as a child    mother states were triggered by migraines; no seizures in > 7 yr.  . 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 Migraines   . Schizophrenia [facility])     MEDICATIONS: Current Outpatient Prescriptions on File Prior to Visit  Medication Sig Dispense Refill  . albuterol (PROAIR HFA) 108 (90 Base) MCG/ACT inhaler Inhale 2 puffs into the lungs every 4 (four) hours as needed for wheezing or shortness of breath. 1 Inhaler 1  . benztropine (COGENTIN) 1 MG tablet TAKE 1 TABLET BY MOUTH TWICE A DAY 8 tablet 0  . fluticasone (FLONASE) 50 MCG/ACT nasal spray Place 2 sprays into both nostrils 2 (two) times daily. 16 g 11  . Melatonin 5 MG TABS Take 1 tablet by mouth daily as needed (sleep).     . OLANZapine (ZYPREXA) 20 MG tablet Take 1 tablet (20 mg total) by mouth at bedtime. 90 tablet 3  . Olopatadine HCl (PAZEO) 0.7 % SOLN Place 1 mL into both eyes daily as needed (allergies).     . polyethylene glycol powder (GLYCOLAX/MIRALAX) powder TAKE 17 GM(1 CAPFUL) ONCE DAILY 255 g 5  . topiramate (TOPAMAX) 25 MG tablet Take 1 tablet at night for 2 weeks, then increase to 2 tablets at night 60 tablet 6  . levonorgestrel (MIRENA) 20 MCG/24HR IUD 1 Intra Uterine Device (1 each total) by Intrauterine route once. 1 each 0   No current facility-administered medications on file prior to visit.     ALLERGIES: Allergies  Allergen Reactions  . Lactose Intolerance (Gi) Diarrhea    FAMILY HISTORY: Family History  Problem Relation Age of Onset  . Diabetes Maternal Grandfather   . Hypertension Maternal Grandfather   . Heart disease Maternal Grandfather   . Kidney disease Maternal Grandfather   . Anesthesia problems Maternal Grandfather        hx. of being hard to wake up post-op  . Hypertension Maternal Grandmother   . Stroke Maternal  Grandmother   . Asthma Mother   . Hypertension Maternal Uncle     SOCIAL HISTORY: Social History   Social History  . Marital status: Single    Spouse name: N/A  . Number of children: N/A  . Years of education: N/A   Occupational History  . Not on file.   Social History Main Topics  . Smoking status: Never Smoker  . Smokeless tobacco: Never Used  . Alcohol use No  . Drug use: No  . Sexual activity: No   Other Topics Concern  . Not on file   Social History Narrative  . No narrative on file    REVIEW OF SYSTEMS: Constitutional: No fevers, chills, or sweats, no generalized fatigue, change in appetite Eyes: No visual changes, double vision, eye pain Ear, nose and throat: No hearing loss, ear pain, nasal congestion, sore throat Cardiovascular: No chest pain, palpitations Respiratory:  No shortness of breath at rest or with exertion, wheezes GastrointestinaI: No nausea, vomiting,  diarrhea, abdominal pain, fecal incontinence Genitourinary:  No dysuria, urinary retention or frequency Musculoskeletal:  No neck pain, back pain Integumentary: No rash, pruritus, skin lesions Neurological: as above Psychiatric: No depression, insomnia, anxiety Endocrine: No palpitations, fatigue, diaphoresis, mood swings, change in appetite, change in weight, increased thirst Hematologic/Lymphatic:  No anemia, purpura, petechiae. Allergic/Immunologic: No itchy/runny eyes, nasal congestion, no recent allergic reactions, rashes  PHYSICAL EXAM: Vitals:   06/08/17 1118  BP: 126/74  Pulse: 75   General: No acute distress Head:  Normocephalic/atraumatic Neck: supple, no paraspinal tenderness, full range of motion Heart:  Regular rate and rhythm Lungs:  Clear to auscultation bilaterally Back: No paraspinal tenderness Skin/Extremities: No rash, no edema Neurological Exam: alert and oriented to person, place, and time. No aphasia or dysarthria. Fund of knowledge is appropriate.  Recent and remote  memory are intact.  Attention and concentration are normal.    Able to name objects and repeat phrases. Cranial nerves: Pupils equal, round, reactive to light. Fundoscopy shows sharp discs bilaterally. Extraocular movements intact with no nystagmus. Visual fields full. Facial sensation intact. No facial asymmetry. Tongue, uvula, palate midline.  Motor: Bulk and tone normal, muscle strength 5/5 throughout with no pronator drift.  Sensation to light touch intact.  No extinction to double simultaneous stimulation.  Deep tendon reflexes 2+ throughout, toes downgoing.  Finger to nose testing intact.  Gait narrow-based and steady, able to tandem walk adequately.  Romberg negative.  IMPRESSION: This is a 21 yo RH woman with a history of schizophrenia, with a witnessed convulsion last 08/26/15. Her mother reported episodes concerning for possible seizures as a child (staring spells, stiffening episodes), however she was never started on seizure medication by pediatric neurology. Her 24-hour EEG is normal. MRI brain normal. No further similar symptoms since then. Her main concern on recent visits have been headaches, she has had good response to low dose Topamax 25mg  qhs. Continue current dose. She has no pregnancy plans and has an IUD. She does not drive and is aware of [place] driving laws to stop driving after an episode of loss of consciousness, until 6 months event-free. She will follow-up in 6 months and knows to call for any changes.   Thank you for allowing me to participate in her care.  Please do not hesitate to call for any questions or concerns.  The duration of this appointment visit was 15 minutes of face-to-face time with the patient.  Greater than 50% of this time was spent in counseling, explanation of diagnosis, planning of further management, and coordination of care.   Ellouise Newer, M.D.   CC: Dr. Vanetta Shawl, Dr. Abby Potash

## 2017-06-08 NOTE — Patient Instructions (Signed)
1. Continue Topamax 25mg  at night 2. Continue headache calendar 3. Follow-up in 6 months, call for any changes

## 2017-06-24 ENCOUNTER — Encounter: Payer: Self-pay | Admitting: Gastroenterology

## 2017-06-24 ENCOUNTER — Ambulatory Visit (INDEPENDENT_AMBULATORY_CARE_PROVIDER_SITE_OTHER): Payer: Medicaid Other | Admitting: Gastroenterology

## 2017-06-24 VITALS — BP 110/60 | HR 78 | Ht <= 58 in | Wt 190.0 lb

## 2017-06-24 DIAGNOSIS — K589 Irritable bowel syndrome without diarrhea: Secondary | ICD-10-CM

## 2017-06-24 DIAGNOSIS — E739 Lactose intolerance, unspecified: Secondary | ICD-10-CM

## 2017-06-24 DIAGNOSIS — K5900 Constipation, unspecified: Secondary | ICD-10-CM

## 2017-06-24 DIAGNOSIS — F209 Schizophrenia, unspecified: Secondary | ICD-10-CM

## 2017-06-24 DIAGNOSIS — R140 Abdominal distension (gaseous): Secondary | ICD-10-CM

## 2017-06-24 MED ORDER — HYOSCYAMINE SULFATE 0.125 MG SL SUBL: 0.1250 mg | SUBLINGUAL_TABLET | SUBLINGUAL | 5 refills | Status: DC | PRN

## 2017-06-24 MED ORDER — POLYETHYLENE GLYCOL 3350 17 GM/SCOOP PO POWD: ORAL | 3 refills | Status: DC

## 2017-06-24 NOTE — Progress Notes (Signed)
Natalie Wagner    [id number]    November 03, 1996  Primary Care Physician:Riccio, Gardiner Rhyme, DO  Referring Physician: Steve Rattler, DO [address] [place], [place] [address]  Chief complaint:  lower abdominal cramps, constipation  HPI:  47 yr F with with history of schizophrenia, irritable bowel syndrome and lactose intolerance previously followed by Dr. Lillia Mountain, pediatric gastroenterologist at [facility] is here to establish care. She is accompanied by her mother. Patient has had extensive GI workup including EGD, colonoscopy, CT enterography unremarkable and lactose breath test positive for lactose intolerance. Patient has been doing well with avoiding lactose. She continues to have intermittent episodes of abdominal discomfort, that improves with hyoscyamine as needed. She is having intermittent episodes of constipation. Denies any blood per rectum, melena, dysphagia or vomiting. She continues to gain weight and has been unable to lose any weight.   Outpatient Encounter Prescriptions as of 06/24/2017  Medication Sig  . albuterol (PROAIR HFA) 108 (90 Base) MCG/ACT inhaler Inhale 2 puffs into the lungs every 4 (four) hours as needed for wheezing or shortness of breath.  . benztropine (COGENTIN) 1 MG tablet TAKE 1 TABLET BY MOUTH TWICE A DAY  . fluticasone (FLONASE) 50 MCG/ACT nasal spray Place 2 sprays into both nostrils 2 (two) times daily.  . Melatonin 5 MG TABS Take 1 tablet by mouth daily as needed (sleep).   . OLANZapine (ZYPREXA) 20 MG tablet Take 1 tablet (20 mg total) by mouth at bedtime.  . Olopatadine HCl (PAZEO) 0.7 % SOLN Place 1 mL into both eyes daily as needed (allergies).   . polyethylene glycol powder (GLYCOLAX/MIRALAX) powder TAKE 17 GM(1 CAPFUL) ONCE DAILY  . topiramate (TOPAMAX) 25 MG tablet Take 1 tablet at night  . levonorgestrel (MIRENA) 20 MCG/24HR IUD 1 Intra Uterine Device (1 each total) by Intrauterine route once.   No  facility-administered encounter medications on file as of 06/24/2017.     Allergies as of 06/24/2017 - Review Complete 06/24/2017  Allergen Reaction Noted  . Lactose intolerance (gi) Diarrhea 05/22/2014    Past Medical History:  Diagnosis Date  . Asthma    prn inhaler  . Constipation 05/05/2013  . Difficulty swallowing pills   . Elevated prolactin level ([place]) 12/27/2014  . Galactorrhea 12/27/2014  . History of seizures as a child    mother states were triggered by migraines; no seizures in > 7 yr.  . 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 Migraines   . Schizophrenia [facility])     Past Surgical History:  Procedure Laterality Date  . COLONOSCOPY    . MRI     under anesthesia  . 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 UMBILICAL HERNIA REPAIR      Family History  Problem Relation Age of Onset  . Diabetes Maternal Grandfather   . Hypertension Maternal Grandfather   . Heart disease Maternal Grandfather   . Kidney disease Maternal Grandfather   . Anesthesia problems Maternal Grandfather        hx. of being hard to wake up post-op  . Hypertension Maternal Grandmother   . Stroke Maternal Grandmother   . Asthma Mother   . Hypertension Maternal  Uncle     Social History   Social History  . Marital status: Single    Spouse name: N/A  . Number of children: N/A  . Years of education: N/A   Occupational History  . Not on file.   Social History Main Topics  . Smoking status: Never Smoker  . Smokeless tobacco: Never Used  . Alcohol use No  . Drug use: No  . Sexual activity: No   Other Topics Concern  . Not on file   Social History Narrative  . No narrative on file      Review of systems: Review of Systems  Constitutional: Negative for  fever and chills.  HENT: Positive for sinus problems   Eyes: Negative for blurred vision.  Respiratory: Negative for cough, shortness of breath and wheezing.   Cardiovascular: Negative for chest pain and palpitations.  Gastrointestinal: as per HPI Genitourinary: Negative for dysuria, urgency, frequency and hematuria.  Musculoskeletal: Positive for myalgias, back pain and joint pain.  Skin: Negative for itching and rash.  Neurological: Negative for dizziness, tremors, focal weakness, seizures and loss of consciousness.  positive for headaches Endo/Heme/Allergies: Positive for seasonal allergies.  Psychiatric/Behavioral: Negative for suicidal ideas and hallucinations.  positive for depression All other systems reviewed and are negative.   Physical Exam: Vitals:   06/24/17 0854  BP: 110/60  Pulse: 78   Body mass index is 39.71 kg/m. Gen:      No acute distress HEENT:  EOMI, sclera anicteric Neck:     No masses; no thyromegaly Lungs:    Clear to auscultation bilaterally; normal respiratory effort CV:         Regular rate and rhythm; no murmurs Abd:      + bowel sounds; soft, non-tender; no palpable masses, no distension Ext:    No edema; adequate peripheral perfusion Skin:      Warm and dry; no rash Neuro: alert and oriented x 3 Psych: normal mood and affect  Data Reviewed:  Reviewed labs, radiology imaging, old records and pertinent past GI work up   Assessment and Plan/Recommendations:  21 year old female with history of schizophrenia, irritable bowel syndrome and lactose intolerance here to establish care  Continue to avoid lactose Hyoscyamine sublingual 1 tab every 4 hours as needed for abdominal cramps and IBS symptoms  Constipation: Increase dietary fiber and fluid intake MiraLAX half capful daily to have regular soft bowel movement daily. Titrate dose of MiraLAX based on response  Return as needed    K. Denzil Magnuson , MD [phone] Mon-Fri 8a-5p [phone]  after 5p, weekends, holidays  CC: Steve Rattler, DO

## 2017-06-24 NOTE — Patient Instructions (Addendum)
We have sent the following medications to your pharmacy for you to pick up at your convenience: Hyoscyamine: sublingual, use every 4 hours as needed  Please purchase Miralax over the counter: Take 1/2 capful daily    If you are age 21 or older, your body mass index should be between 23-30. Your Body mass index is 39.71 kg/m. If this is out of the aforementioned range listed, please consider follow up with your Primary Care Provider.  If you are age 84 or younger, your body mass index should be between 19-25. Your Body mass index is 39.71 kg/m. If this is out of the aformentioned range listed, please consider follow up with your Primary Care Provider.   Follow up as needed.

## 2017-07-24 ENCOUNTER — Other Ambulatory Visit (HOSPITAL_COMMUNITY): Payer: Self-pay | Admitting: Psychiatry

## 2017-07-24 DIAGNOSIS — F201 Disorganized schizophrenia: Secondary | ICD-10-CM

## 2017-08-12 ENCOUNTER — Ambulatory Visit (INDEPENDENT_AMBULATORY_CARE_PROVIDER_SITE_OTHER): Payer: Medicaid Other | Admitting: Psychiatry

## 2017-08-12 ENCOUNTER — Encounter (HOSPITAL_COMMUNITY): Payer: Self-pay | Admitting: Psychiatry

## 2017-08-12 DIAGNOSIS — F913 Oppositional defiant disorder: Secondary | ICD-10-CM

## 2017-08-12 DIAGNOSIS — F201 Disorganized schizophrenia: Secondary | ICD-10-CM | POA: Diagnosis not present

## 2017-08-12 DIAGNOSIS — G4700 Insomnia, unspecified: Secondary | ICD-10-CM

## 2017-08-12 DIAGNOSIS — Z818 Family history of other mental and behavioral disorders: Secondary | ICD-10-CM

## 2017-08-12 DIAGNOSIS — Z79899 Other long term (current) drug therapy: Secondary | ICD-10-CM

## 2017-08-12 MED ORDER — OLANZAPINE 20 MG PO TABS: 20.0000 mg | ORAL_TABLET | Freq: Every day | ORAL | 0 refills | Status: DC

## 2017-08-12 MED ORDER — BENZTROPINE MESYLATE 1 MG PO TABS: 1.0000 mg | ORAL_TABLET | Freq: Two times a day (BID) | ORAL | 0 refills | Status: DC

## 2017-08-12 NOTE — Progress Notes (Signed)
[facility] MD/PA/NP OP Progress Note  08/12/2017 10:20 AM URA YINGLING  MRN:  [id number]  Chief Complaint:  Chief Complaint    Follow-up     HPI: Here with mom. Mom states pt is doing well. She denies any issues with angry outbursts.   Pt states she is getting 7-8 hrs of sleep per night. She often wakes up due to neighbors playing loud music. Energy is ok and she is rarely napping. Appetite is decreased some with Topamax. She is eating about 2 meals and snacks but has lost 6 lbs in 3 months.  Pt reports she is having some memory issues since starting Topamax.  Pt is often bored since she nothing to do. Pt will cook, watch tv, listen to music and color. Pt denies depression, crying spells, worthlessness. Pt denies SI/HI.  Pt denies AVH/paranoia/ideas of reference or magical thinking.  Pt states-taking meds as prescribed and endorsing SE of constipation.  Visit Diagnosis:    ICD-10-CM   1. 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 Asthma    prn inhaler  . Constipation 05/05/2013  . Difficulty swallowing pills   . Elevated prolactin level ([place]) 12/27/2014  . Galactorrhea 12/27/2014  . History of seizures as a child    mother states were triggered by migraines; no seizures in > 7 yr.  . 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 Migraines   . Schizophrenia [facility])     Past Surgical History:  Procedure Laterality Date  . COLONOSCOPY    . MRI     under anesthesia  . 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 UMBILICAL HERNIA REPAIR      Family Psychiatric and Medical History:  Family History  Problem Relation Age of Onset  . Diabetes Maternal Grandfather   . Hypertension Maternal Grandfather   . Heart disease Maternal Grandfather   . Kidney disease Maternal Grandfather   . Anesthesia problems Maternal Grandfather        hx. of being hard to wake up post-op  . Hypertension Maternal Grandmother   . Stroke Maternal Grandmother   . Asthma Mother   . Hypertension Maternal Uncle   . Depression Sister     Social History:  Social History   Social History  . Marital status: Single    Spouse name: N/A  . Number of children: N/A  . Years of education: N/A   Social History Main Topics  . Smoking status: Never Smoker  . Smokeless tobacco: Never Used  . Alcohol use No  . Drug use: No  . Sexual activity: No   Other Topics Concern  . None   Social History Narrative  . None    Allergies:  Allergies  Allergen Reactions  . Lactose Intolerance (Gi) Diarrhea    Metabolic Disorder Labs: Lab Results  Component Value Date   HGBA1C 5.3 03/02/2017   MPG 105 03/02/2017   MPG 94 07/27/2016   Lab Results  Component Value Date   PROLACTIN 34.0 (H)  11/21/2016   PROLACTIN 25.7 12/19/2014   Lab Results  Component Value Date   CHOL 193 03/02/2017   TRIG 80 03/02/2017   HDL 40 (L) 03/02/2017   CHOLHDL 4.8 03/02/2017   VLDL 16 03/02/2017   LDLCALC 137 (H) 03/02/2017   LDLCALC 104 07/27/2016   Lab Results  Component Value Date   TSH 2.67 03/02/2017   TSH 2.18 11/21/2016    Therapeutic Level Labs: No results found for: LITHIUM No results found for: VALPROATE No components found for:  CBMZ  Current Medications: Current Outpatient Prescriptions  Medication Sig Dispense Refill  . albuterol (PROAIR HFA) 108 (90 Base) MCG/ACT inhaler Inhale 2 puffs into the lungs every 4 (four) hours  as needed for wheezing or shortness of breath. 1 Inhaler 1  . benztropine (COGENTIN) 1 MG tablet TAKE 1 TABLET BY MOUTH TWICE A DAY 8 tablet 0  . benztropine (COGENTIN) 1 MG tablet TAKE 1 TABLET BY MOUTH TWICE A DAY 30 tablet 0  . fluticasone (FLONASE) 50 MCG/ACT nasal spray Place 2 sprays into both nostrils 2 (two) times daily. 16 g 11  . hyoscyamine (LEVSIN SL) 0.125 MG SL tablet Place 1 tablet (0.125 mg total) under the tongue every 4 (four) hours as needed. 180 tablet 5  . Melatonin 5 MG TABS Take 1 tablet by mouth daily as needed (sleep).     . OLANZapine (ZYPREXA) 20 MG tablet Take 1 tablet (20 mg total) by mouth at bedtime. 90 tablet 3  . Olopatadine HCl (PAZEO) 0.7 % SOLN Place 1 mL into both eyes daily as needed (allergies).     . polyethylene glycol powder (GLYCOLAX/MIRALAX) powder Take 1/2 capful daily 255 g 3  . topiramate (TOPAMAX) 25 MG tablet Take 1 tablet at night 90 tablet 3  . levonorgestrel (MIRENA) 20 MCG/24HR IUD 1 Intra Uterine Device (1 each total) by Intrauterine route once. 1 each 0   No current facility-administered medications for this visit.      Musculoskeletal: Strength & Muscle Tone: within normal limits Gait & Station: normal Patient leans: N/A  Psychiatric Specialty Exam: Review of Systems  Gastrointestinal: Positive for abdominal pain and constipation. Negative for diarrhea.  Neurological: Positive for headaches. Negative for dizziness, tingling and sensory change.  Psychiatric/Behavioral: Negative for depression, hallucinations, substance abuse and suicidal ideas. The patient is not nervous/anxious and does not have insomnia.     Blood pressure 124/80, pulse 74, height 4\' 11"  (1.499 m), weight 184 lb 3.2 oz (83.6 kg).Body mass index is 37.2 kg/m.  General Appearance: Casual  Eye Contact:  Fair  Speech:  Clear and Coherent and Normal Rate  Volume:  Decreased  Mood:  Euthymic  Affect:  Blunt  Thought Process:  Coherent and Descriptions of  Associations: Intact  Orientation:  Full (Time, Place, and Person)  Thought Content: Logical   Suicidal Thoughts:  No  Homicidal Thoughts:  No  Memory:  Immediate;   Fair Recent;   Fair Remote;   Fair  Judgement:  Fair  Insight:  Shallow  Psychomotor Activity:  Normal  Concentration:  Concentration: Good and Attention Span: Good  Recall:  Good  Fund of Knowledge: Good  Language: Good  Akathisia:  No  Handed:  Right  AIMS (if indicated): not done  Assets:  Communication Skills Desire for Improvement Housing  ADL's:  Intact  Cognition: WNL  Sleep:  Fair   Screenings: PHQ2-9     Office Visit from 03/22/2017 in [place]  PHQ-2  Total Score  0       Assessment and Plan: Schizophrenia- undifferentiated type; ODD; Insomnia   Medication management with supportive therapy. Risks/benefits and SE of the medication discussed. Pt verbalized understanding and verbal consent obtained for treatment.  Affirm with the patient that the medications are taken as ordered. Patient expressed understanding of how their medications were to be used.   The risk of un-intended pregnancy is low based on the fact that pt reports she has an IUD. Pt is aware that these meds carry a teratogenic risk. Pt will discuss plan of action if she does or plans to become pregnant in the future.   Meds:  Zyprexa 20mg  po qHS for schizophrenia Cogentin 1mg  po BID to prevent EPS We discussed how weight gain can be a SE of Zyprexa. Labs are WNL and no signs of metabolic issues despite weight gain. Pt declined med change and would prefer to diet to lose weight rather than switch off Zyprexa.    Labs: none  Therapy: brief supportive therapy provided. Discussed psychosocial stressors in detail.     Consultations: none  Pt denies SI and is at an acute low risk for suicide. Patient told to call clinic if any problems occur. Patient advised to go to ER if they should develop SI/HI, side effects,  or if symptoms worsen. Has crisis numbers to call if needed. Pt verbalized understanding.  F/up in 3 months or sooner if needed    [name], MD 08/12/2017, 10:20 AM

## 2017-11-12 ENCOUNTER — Other Ambulatory Visit (HOSPITAL_COMMUNITY): Payer: Self-pay | Admitting: Psychiatry

## 2017-11-12 DIAGNOSIS — F201 Disorganized schizophrenia: Secondary | ICD-10-CM

## 2017-11-18 ENCOUNTER — Other Ambulatory Visit (HOSPITAL_COMMUNITY): Payer: Self-pay

## 2017-11-18 ENCOUNTER — Ambulatory Visit (HOSPITAL_COMMUNITY): Payer: Self-pay | Admitting: Psychiatry

## 2017-11-18 DIAGNOSIS — F201 Disorganized schizophrenia: Secondary | ICD-10-CM

## 2017-11-18 MED ORDER — BENZTROPINE MESYLATE 1 MG PO TABS: 1.0000 mg | ORAL_TABLET | Freq: Two times a day (BID) | ORAL | 0 refills | Status: DC

## 2017-11-18 MED ORDER — OLANZAPINE 20 MG PO TABS: 20.0000 mg | ORAL_TABLET | Freq: Every day | ORAL | 0 refills | Status: DC

## 2017-12-02 ENCOUNTER — Ambulatory Visit (INDEPENDENT_AMBULATORY_CARE_PROVIDER_SITE_OTHER): Payer: Medicaid Other | Admitting: Psychiatry

## 2017-12-02 ENCOUNTER — Encounter (HOSPITAL_COMMUNITY): Payer: Self-pay | Admitting: Psychiatry

## 2017-12-02 VITALS — BP 120/78 | HR 103 | Ht 59.0 in | Wt 186.0 lb

## 2017-12-02 DIAGNOSIS — Z79899 Other long term (current) drug therapy: Secondary | ICD-10-CM

## 2017-12-02 DIAGNOSIS — F201 Disorganized schizophrenia: Secondary | ICD-10-CM | POA: Diagnosis not present

## 2017-12-02 DIAGNOSIS — Z818 Family history of other mental and behavioral disorders: Secondary | ICD-10-CM | POA: Diagnosis not present

## 2017-12-02 MED ORDER — OLANZAPINE 20 MG PO TABS: 20.0000 mg | ORAL_TABLET | Freq: Every day | ORAL | 0 refills | Status: DC

## 2017-12-02 MED ORDER — BENZTROPINE MESYLATE 1 MG PO TABS: 1.0000 mg | ORAL_TABLET | Freq: Two times a day (BID) | ORAL | 0 refills | Status: DC

## 2017-12-02 NOTE — Addendum Note (Signed)
Addended by: Dennie Maizes E on: 12/02/2017 11:06 AM   Modules accepted: Orders

## 2017-12-02 NOTE — Progress Notes (Signed)
[place] MD/PA/NP OP Progress Note  12/02/2017 10:31 AM Natalie Wagner  MRN:  [id number]  Chief Complaint:  Chief Complaint    Schizophrenia; Follow-up     HPI: "Overall I am kinda better". She denies AVH and paranoia. She occasionally will burst out laughing at her old behavior when she was sick. Pt sleep is not sleeping well- she has dry mouth and gets up to go to the bathroom a lot. Pt is tired all the time. Pt admits she is drinking soda more. She spends a lot of time sleeping and watching tv. Pt has not seen any PCP since last year and states she now aged out of her former provider. Pt denies depression and SI/HI.  Pt states-taking meds as prescribed and denies SE.   Visit Diagnosis:    ICD-10-CM   1. 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 Encounter for long-term (current) use of medications Z79.899 EKG     Past Psychiatric History:  Anxiety: No Bipolar Disorder: No Depression: No Mania: No Psychosis: Yes Schizophrenia: Yes Personality Disorder: No Hospitalization for psychiatric illness: Yes History of Electroconvulsive Shock Therapy: No Prior Suicide Attempts: Yes   Past Medical History:  Past Medical History:  Diagnosis Date  . Asthma    prn inhaler  . Constipation 05/05/2013  . Difficulty swallowing pills   . Elevated prolactin level ([place]) 12/27/2014  . Galactorrhea 12/27/2014  . History of seizures as a child    mother states were triggered by migraines; no seizures in > 7 yr.  . 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 Migraines   . Schizophrenia [facility])     Past Surgical History:  Procedure Laterality Date  . COLONOSCOPY    . MRI     under anesthesia  . 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 UMBILICAL HERNIA REPAIR      Family Psychiatric History: Family History  Problem Relation Age of Onset  . Diabetes Maternal Grandfather   . Hypertension Maternal Grandfather   . Heart disease Maternal Grandfather   . Kidney disease Maternal Grandfather   . Anesthesia problems Maternal Grandfather        hx. of being hard to wake up post-op  . Hypertension Maternal Grandmother   . Stroke Maternal Grandmother   . Asthma Mother   . Hypertension Maternal Uncle   . Depression Sister     Social History:  Social History   Socioeconomic History  . Marital status: Single    Spouse name: None  . Number of children: None  . Years of education: None  . Highest education level: None  Social Needs  . Financial resource strain: None  . Food insecurity - worry: None  . Food insecurity - inability: None  . Transportation needs - medical: None  . Transportation needs - non-medical: None  Occupational History  . None  Tobacco Use  . Smoking status: Never Smoker  . Smokeless tobacco: Never Used  Substance and Sexual Activity  . Alcohol use: No    Alcohol/week: 0.0 oz  . Drug use: No  . Sexual activity: No    Birth control/protection: IUD  Other Topics Concern  .  None  Social History Narrative  . None    Allergies:  Allergies  Allergen Reactions  . Lactose Intolerance (Gi) Diarrhea    Metabolic Disorder Labs: Lab Results  Component Value Date   HGBA1C 5.3 03/02/2017   MPG 105 03/02/2017   MPG 94 07/27/2016   Lab Results  Component Value Date   PROLACTIN 34.0 (H) 11/21/2016   PROLACTIN 25.7 12/19/2014   Lab Results  Component Value Date   CHOL 193 03/02/2017   TRIG 80 03/02/2017   HDL 40 (L) 03/02/2017   CHOLHDL 4.8 03/02/2017   VLDL 16 03/02/2017   LDLCALC 137 (H) 03/02/2017   LDLCALC 104 07/27/2016   Lab Results  Component Value Date   TSH 2.67 03/02/2017   TSH 2.18 11/21/2016    Therapeutic Level  Labs: No results found for: LITHIUM No results found for: VALPROATE No components found for:  CBMZ  Current Medications: Current Outpatient Medications  Medication Sig Dispense Refill  . albuterol (PROAIR HFA) 108 (90 Base) MCG/ACT inhaler Inhale 2 puffs into the lungs every 4 (four) hours as needed for wheezing or shortness of breath. 1 Inhaler 1  . benztropine (COGENTIN) 1 MG tablet Take 1 tablet (1 mg total) by mouth 2 (two) times daily. 60 tablet 0  . fluticasone (FLONASE) 50 MCG/ACT nasal spray Place 2 sprays into both nostrils 2 (two) times daily. 16 g 11  . hyoscyamine (LEVSIN SL) 0.125 MG SL tablet Place 1 tablet (0.125 mg total) under the tongue every 4 (four) hours as needed. 180 tablet 5  . Melatonin 5 MG TABS Take 1 tablet by mouth daily as needed (sleep).     . OLANZapine (ZYPREXA) 20 MG tablet Take 1 tablet (20 mg total) by mouth at bedtime. 30 tablet 0  . polyethylene glycol powder (GLYCOLAX/MIRALAX) powder Take 1/2 capful daily 255 g 3  . topiramate (TOPAMAX) 25 MG tablet Take 1 tablet at night 90 tablet 3  . levonorgestrel (MIRENA) 20 MCG/24HR IUD 1 Intra Uterine Device (1 each total) by Intrauterine route once. 1 each 0  . Olopatadine HCl (PAZEO) 0.7 % SOLN Place 1 mL into both eyes daily as needed (allergies).      No current facility-administered medications for this visit.      Musculoskeletal: Strength & Muscle Tone: within normal limits Gait & Station: normal Patient leans: N/A  Psychiatric Specialty Exam: Review of Systems  Constitutional: Positive for malaise/fatigue and weight loss. Negative for chills and fever.  Neurological: Positive for tingling. Negative for speech change and headaches.    Blood pressure 120/78, pulse (!) 103, height 4\' 11"  (1.499 m), weight 186 lb (84.4 kg), SpO2 97 %.Body mass index is 37.57 kg/m.  General Appearance: Fairly Groomed  Eye Contact:  Good  Speech:  Clear and Coherent and Normal Rate  Volume:  Normal  Mood:   Euthymic  Affect:  Blunt  Thought Process:  Goal Directed and Descriptions of Associations: Intact  Orientation:  Full (Time, Place, and Person)  Thought Content: Logical   Suicidal Thoughts:  No  Homicidal Thoughts:  No  Memory:  Immediate;   Good Recent;   Good Remote;   Good  Judgement:  Good  Insight:  Present  Psychomotor Activity:  Normal  Concentration:  Concentration: Good and Attention Span: Good  Recall:  Good  Fund of Knowledge: Good  Language: Good  Akathisia:  No  Handed:  Right  AIMS (if indicated): not done  Assets:  Communication Skills Desire for Improvement  Housing Social Support  ADL's:  Intact  Cognition: WNL  Sleep:  Poor   Screenings: PHQ2-9     Office Visit from 03/22/2017 in [place]  PHQ-2 Total Score  0       Assessment and Plan: Schizophrenia-undifferentiated type; ODD; insomnia    Medication management with supportive therapy. Risks/benefits and SE of the medication discussed. Pt verbalized understanding and verbal consent obtained for treatment.  Affirm with the patient that the medications are taken as ordered. Patient expressed understanding of how their medications were to be used.   The risk of un-intended pregnancy is low based on the fact that pt reports she has an IUD. Pt is aware that these meds carry a teratogenic risk. Pt will discuss plan of action if she does or plans to become pregnant in the future.   Meds: Zyprexa 20 mg p.o. nightly for schizophrenia Cogentin 1 mg p.o. twice daily to prevent EPS from antipsychotic use.  May consider treatment with Metformin .   Labs: ordered CBC, CMP, HbA1c, Lipid panel, TSH, Prolactin level, EKG   Therapy: brief supportive therapy provided. Discussed psychosocial stressors in detail.     Consultations: Will refer pt to PCP for symptoms possibly related to DM  Pt denies SI and is at an acute low risk for suicide. Patient told to call clinic if any problems  occur. Patient advised to go to ER if they should develop SI/HI, side effects, or if symptoms worsen. Has crisis numbers to call if needed. Pt verbalized understanding.  F/up in 2 months or sooner if needed    [name], MD 12/02/2017, 10:31 AM

## 2017-12-04 LAB — TSH: TSH: 3.47 u[IU]/mL (ref 0.450–4.500)

## 2017-12-04 LAB — CBC WITH DIFFERENTIAL/PLATELET
BASOS ABS: 0 10*3/uL (ref 0.0–0.2)
Basos: 0 %
EOS (ABSOLUTE): 0 10*3/uL (ref 0.0–0.4)
Eos: 0 %
HEMATOCRIT: 36.1 % (ref 34.0–46.6)
Hemoglobin: 11.5 g/dL (ref 11.1–15.9)
IMMATURE GRANS (ABS): 0 10*3/uL (ref 0.0–0.1)
Immature Granulocytes: 0 %
Lymphocytes Absolute: 2.4 10*3/uL (ref 0.7–3.1)
Lymphs: 18 %
MCH: 28.8 pg (ref 26.6–33.0)
MCHC: 31.9 g/dL (ref 31.5–35.7)
MCV: 91 fL (ref 79–97)
Monocytes Absolute: 0.8 10*3/uL (ref 0.1–0.9)
Monocytes: 6 %
NEUTROS ABS: 9.9 10*3/uL — AB (ref 1.4–7.0)
Neutrophils: 76 %
PLATELETS: 428 10*3/uL — AB (ref 150–379)
RBC: 3.99 x10E6/uL (ref 3.77–5.28)
RDW: 13.4 % (ref 12.3–15.4)
WBC: 13.2 10*3/uL — ABNORMAL HIGH (ref 3.4–10.8)

## 2017-12-04 LAB — COMPREHENSIVE METABOLIC PANEL
A/G RATIO: 1.5 (ref 1.2–2.2)
ALBUMIN: 4.4 g/dL (ref 3.5–5.5)
ALT: 17 IU/L (ref 0–32)
AST: 15 IU/L (ref 0–40)
Alkaline Phosphatase: 118 IU/L — ABNORMAL HIGH (ref 39–117)
BILIRUBIN TOTAL: 0.6 mg/dL (ref 0.0–1.2)
BUN / CREAT RATIO: 12 (ref 9–23)
BUN: 11 mg/dL (ref 6–20)
CHLORIDE: 103 mmol/L (ref 96–106)
CO2: 20 mmol/L (ref 20–29)
Calcium: 9.5 mg/dL (ref 8.7–10.2)
Creatinine, Ser: 0.92 mg/dL (ref 0.57–1.00)
GFR calc non Af Amer: 89 mL/min/{1.73_m2} (ref >59)
GFR, EST AFRICAN AMERICAN: 103 mL/min/{1.73_m2} (ref >59)
Globulin, Total: 2.9 g/dL (ref 1.5–4.5)
Glucose: 101 mg/dL — ABNORMAL HIGH (ref 65–99)
POTASSIUM: 4.8 mmol/L (ref 3.5–5.2)
Sodium: 140 mmol/L (ref 134–144)
TOTAL PROTEIN: 7.3 g/dL (ref 6.0–8.5)

## 2017-12-04 LAB — LIPID PANEL WITH LDL/HDL RATIO
Cholesterol, Total: 195 mg/dL (ref 100–199)
HDL: 34 mg/dL — AB (ref >39)
LDL Calculated: 146 mg/dL — ABNORMAL HIGH (ref 0–99)
LDL/HDL RATIO: 4.3 ratio — AB (ref 0.0–3.2)
TRIGLYCERIDES: 74 mg/dL (ref 0–149)
VLDL Cholesterol Cal: 15 mg/dL (ref 5–40)

## 2017-12-04 LAB — PROLACTIN: Prolactin: 48.8 ng/mL — ABNORMAL HIGH (ref 4.8–23.3)

## 2017-12-04 LAB — HEMOGLOBIN A1C
Est. average glucose Bld gHb Est-mCnc: 108 mg/dL
Hgb A1c MFr Bld: 5.4 % (ref 4.8–5.6)

## 2017-12-17 ENCOUNTER — Other Ambulatory Visit: Payer: Self-pay

## 2017-12-17 ENCOUNTER — Encounter: Payer: Self-pay | Admitting: Neurology

## 2017-12-17 ENCOUNTER — Ambulatory Visit (INDEPENDENT_AMBULATORY_CARE_PROVIDER_SITE_OTHER): Payer: Medicaid Other | Admitting: Neurology

## 2017-12-17 VITALS — BP 130/74 | HR 113 | Ht 59.0 in | Wt 193.0 lb

## 2017-12-17 DIAGNOSIS — G44209 Tension-type headache, unspecified, not intractable: Secondary | ICD-10-CM

## 2017-12-17 DIAGNOSIS — R400 Somnolence: Secondary | ICD-10-CM | POA: Diagnosis not present

## 2017-12-17 NOTE — Progress Notes (Signed)
NEUROLOGY FOLLOW UP OFFICE NOTE  Natalie Wagner [id number]  DOB: 10-30-1996  HISTORY OF PRESENT ILLNESS: I had the pleasure of seeing Natalie Wagner in follow-up in the neurology clinic on 12/17/2017. She is accompanied by a family friend who helps supplement the history today. The patient was last seen 7 months ago for headaches and new onset seizure last 08/26/15. Her mother previously reported concern for seizures as an infant, but she was never started on seizure medication. Imaging and 24-hour EEG were normal. No further seizures with loss of consciousness or shaking spells since October 2016. On her last visit, she reported improvement of headaches on low dose Topamax 25mg  qhs. She is not having the bad headaches any more, but has slight headaches 1-2 times a day with brief sharp pains on the side of her head. She does not take Tylenol on a daily basis. She sleeps 7-9 hours but is still tired in the morning. She reports being told she snores. She is concerned the Topamax is making her drowsy. She has not had any dosage changes of her psychiatric medications. She wakes up at night because her mouth is dry. Her family friend wonders about her napping during the day, hence poor sleep at night, because her mother has odd working hours. She denies any dizziness, diplopia, dysarthria/dysphagia, focal numbness/tingling/weakness, neck/back pain, no ralls.   HPI: This is a 22 yo RH woman with a history of schizophrenia who presented after a seizure last 08/26/15. She recalls waking up then starting to feel dizzy and lightheaded, then heard her mother screaming and waking up on the couch. Her mother reports she lost consciousness and started shaking, rocking side to side. No tongue bite or incontinence. [facility] where CBC and BMP were normal, no urine drug screen done. I personally reviewed head CT without contrast which was normal. Her mother reports she was born premature at 23 weeks  and had delayed development with occupation and speech therapy as a child. She was in special education classes. As a baby, she would have episodes where she would get stiff as a board and shake, then in the 2nd grade she passed out with shaking and incontinence. She also had brief staring spells occurring around 3 times a week. She had seen pediatric neurologist Dr. Gaynell Face at that time, mother is unsure of diagnosis but denies any treatment for seizures. There is note that symptoms were felt to be due to migraines. The last staring spell was in the 7th grade. She was diagnosed with schizophrenia in the 9th grade, she was having visual hallucinations and would forget who she was, suddenly drinking from a fountain and spitting it out. She was admitted to inpatient psychiatry for 2 weeks and has been taking Zyprexa for the past 3 years. She denies any more hallucinations, but her mother is concerned about "strange behaviors" the past month. In the waiting room today, she was staring at the form and her mother told her to write something down. She then wrote over what her mother had filled out. She talks as if there is a third person in the room, sometimes she "just talks and does not know it." She closes and opens doors repetitively. She lives with her mother and denies any alcohol intake. She reports good sleep.   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 There is no history of febrile convulsions, CNS infections such as meningitis/encephalitis, significant traumatic brain injury, neurosurgical  procedures, or family history of seizures.  Diagnostic Data: MRI brain with and without contrast done 01/14/15 for galactorrhea. I personally reviewed images, no acute changes, hippocampi symmetric with no abnormal signal or enhancement seen. Pituitary gland normal. EEG as above  I personally reviewed MRI brain with and without contrast 10/21/15 which was normal, hippocampi symmetric with  no abnormal signal or enhancement seen. Her 1-hour sleep-deprived EEG, as well as 24-hour EEG were normal. Typical events were not captured.    PAST MEDICAL HISTORY: Past Medical History:  Diagnosis Date  . Asthma    prn inhaler  . Constipation 05/05/2013  . Difficulty swallowing pills   . Elevated prolactin level ([place]) 12/27/2014  . Galactorrhea 12/27/2014  . History of seizures as a child    mother states were triggered by migraines; no seizures in > 7 yr.  . 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 Migraines   . Schizophrenia [facility])     MEDICATIONS: Current Outpatient Medications on File Prior to Visit  Medication Sig Dispense Refill  . albuterol (PROAIR HFA) 108 (90 Base) MCG/ACT inhaler Inhale 2 puffs into the lungs every 4 (four) hours as needed for wheezing or shortness of breath. 1 Inhaler 1  . benztropine (COGENTIN) 1 MG tablet Take 1 tablet (1 mg total) by mouth 2 (two) times daily. 60 tablet 0  . fluticasone (FLONASE) 50 MCG/ACT nasal spray Place 2 sprays into both nostrils 2 (two) times daily. 16 g 11  . hyoscyamine (LEVSIN SL) 0.125 MG SL tablet Place 1 tablet (0.125 mg total) under the tongue every 4 (four) hours as needed. 180 tablet 5  . levonorgestrel (MIRENA) 20 MCG/24HR IUD 1 Intra Uterine Device (1 each total) by Intrauterine route once. 1 each 0  . Melatonin 5 MG TABS Take 1 tablet by mouth daily as needed (sleep).     . OLANZapine (ZYPREXA) 20 MG tablet Take 1 tablet (20 mg total) by mouth at bedtime. 30 tablet 0  . Olopatadine HCl (PAZEO) 0.7 % SOLN Place 1 mL into both eyes daily as needed (allergies).     . polyethylene glycol powder (GLYCOLAX/MIRALAX) powder Take 1/2 capful daily 255 g 3  . topiramate (TOPAMAX) 25 MG tablet Take 1 tablet at night 90 tablet 3   No current facility-administered medications on file prior to visit.     ALLERGIES: Allergies  Allergen Reactions  . Lactose  Intolerance (Gi) Diarrhea    FAMILY HISTORY: Family History  Problem Relation Age of Onset  . Diabetes Maternal Grandfather   . Hypertension Maternal Grandfather   . Heart disease Maternal Grandfather   . Kidney disease Maternal Grandfather   . Anesthesia problems Maternal Grandfather        hx. of being hard to wake up post-op  . Hypertension Maternal Grandmother   . Stroke Maternal Grandmother   . Asthma Mother   . Hypertension Maternal Uncle   . Depression Sister     SOCIAL HISTORY: Social History   Socioeconomic History  . Marital status: Single    Spouse name: Not on file  . Number of children: Not on file  . Years of education: Not on file  . Highest education level: Not on file  Social Needs  . Financial resource strain: Not on file  . Food insecurity - worry: Not on file  . Food insecurity - inability: Not on file  . Transportation needs - medical:  Not on file  . Transportation needs - non-medical: Not on file  Occupational History  . Not on file  Tobacco Use  . Smoking status: Never Smoker  . Smokeless tobacco: Never Used  Substance and Sexual Activity  . Alcohol use: No    Alcohol/week: 0.0 oz  . Drug use: No  . Sexual activity: No    Birth control/protection: IUD  Other Topics Concern  . Not on file  Social History Narrative  . Not on file    REVIEW OF SYSTEMS: Constitutional: No fevers, chills, or sweats, no generalized fatigue, change in appetite Eyes: No visual changes, double vision, eye pain Ear, nose and throat: No hearing loss, ear pain, nasal congestion, sore throat Cardiovascular: No chest pain, palpitations Respiratory:  No shortness of breath at rest or with exertion, wheezes GastrointestinaI: No nausea, vomiting, diarrhea, abdominal pain, fecal incontinence Genitourinary:  No dysuria, urinary retention or frequency Musculoskeletal:  No neck pain, back pain Integumentary: No rash, pruritus, skin lesions Neurological: as  above Psychiatric: No depression, insomnia, anxiety Endocrine: No palpitations, fatigue, diaphoresis, mood swings, change in appetite, change in weight, increased thirst Hematologic/Lymphatic:  No anemia, purpura, petechiae. Allergic/Immunologic: No itchy/runny eyes, nasal congestion, no recent allergic reactions, rashes  PHYSICAL EXAM: Vitals:   12/17/17 1022  BP: 130/74  Pulse: (!) 113  SpO2: 98%   General: No acute distress Head:  Normocephalic/atraumatic Neck: supple, no paraspinal tenderness, full range of motion Heart:  Regular rate and rhythm Lungs:  Clear to auscultation bilaterally Back: No paraspinal tenderness Skin/Extremities: No rash, no edema Neurological Exam: alert and oriented to person, place, and time. No aphasia or dysarthria. Fund of knowledge is appropriate.  Recent and remote memory are intact.  Attention and concentration are normal.    Able to name objects and repeat phrases. Cranial nerves: Pupils equal, round, reactive to light. Fundoscopy shows sharp discs bilaterally. Extraocular movements intact with no nystagmus. Visual fields full. Facial sensation intact. No facial asymmetry. Tongue, uvula, palate midline.  Motor: Bulk and tone normal, muscle strength 5/5 throughout with no pronator drift.  Sensation to light touch intact.  No extinction to double simultaneous stimulation.  Deep tendon reflexes 2+ throughout, toes downgoing.  Finger to nose testing intact.  Gait narrow-based and steady, able to tandem walk adequately.  Romberg negative.  IMPRESSION: This is a 22 yo RH woman with a history of schizophrenia, with a witnessed convulsion last 08/26/15. Her mother reported episodes concerning for possible seizures as a child (staring spells, stiffening episodes), however she was never started on seizure medication by pediatric neurology. Her 24-hour EEG is normal. MRI brain normal. No further similar symptoms since October 2016. Her main concern on recent visits  have been headaches, she had a good response to low dose Topamax 25mg  qhs, but is now concerned it is causing daytime drowsiness. She has been taking this medication for a while now, unlikely to cause these symptoms, but since she is not having bad headaches, we have agreed to stop medication and see if daytime drowsiness improves. We discussed the possibility of sleep apnea, she has gained a significant amount of weight with Zyprexa, a sleep study will be ordered. She does not drive and is aware of [place] driving laws to stop driving after an episode of loss of consciousness, until 6 months event-free. She will follow-up in 4-5 months and knows to call for any changes.   Thank you for allowing me to participate in her care.  Please do not  hesitate to call for any questions or concerns.  The duration of this appointment visit was 25 minutes of face-to-face time with the patient.  Greater than 50% of this time was spent in counseling, explanation of diagnosis, planning of further management, and coordination of care.   Ellouise Newer, M.D.   CC: Dr. Vanetta Shawl

## 2017-12-17 NOTE — Patient Instructions (Signed)
1. Schedule home sleep study 2. Stop Topamax 3. If the drowsiness is still present even after stopping Topamax, then it is not the Topamax causing your drowsiness 4. Follow-up in 4-5 months, call for any changes

## 2017-12-24 ENCOUNTER — Encounter: Payer: Self-pay | Admitting: Neurology

## 2018-01-12 ENCOUNTER — Encounter (HOSPITAL_BASED_OUTPATIENT_CLINIC_OR_DEPARTMENT_OTHER): Payer: Self-pay | Admitting: *Deleted

## 2018-01-12 ENCOUNTER — Ambulatory Visit (HOSPITAL_BASED_OUTPATIENT_CLINIC_OR_DEPARTMENT_OTHER): Payer: Medicaid Other | Attending: Neurology | Admitting: Internal Medicine

## 2018-01-12 VITALS — Ht 59.0 in | Wt 184.0 lb

## 2018-01-12 DIAGNOSIS — G4733 Obstructive sleep apnea (adult) (pediatric): Secondary | ICD-10-CM | POA: Insufficient documentation

## 2018-01-12 DIAGNOSIS — R400 Somnolence: Secondary | ICD-10-CM | POA: Insufficient documentation

## 2018-01-12 DIAGNOSIS — G44209 Tension-type headache, unspecified, not intractable: Secondary | ICD-10-CM | POA: Diagnosis present

## 2018-01-21 DIAGNOSIS — G4733 Obstructive sleep apnea (adult) (pediatric): Secondary | ICD-10-CM

## 2018-01-21 NOTE — Progress Notes (Signed)
Pls let patient/mother know the sleep study showed mild sleep apnea. Losing weight can potentially help, but I would recommend she start seeing the sleep specialist for the sleep apnea. Pls send referral. Thanks

## 2018-01-21 NOTE — Procedures (Signed)
  Patient Name: Natalie Wagner, Natalie Wagner Date: 01/12/2018 Gender: Female D.O.B: 01-10-1996 Age (years): 21 Referring Provider: Cameron Sprang Height (inches): 75 Interpreting Physician: Baird Lyons MD, ABSM Weight (lbs): 184 RPSGT: Jonna Coup BMI: 37 MRN: [id number] Neck Size: 14.50 <br> <br> CLINICAL INFORMATION Sleep Study Type: HST Indication for sleep study: Excessive Daytime Sleepiness (780.79), Snoring (786.09)  Epworth Sleepiness Score: 10  SLEEP STUDY TECHNIQUE A multi-channel overnight portable sleep study was performed. The channels recorded were: nasal airflow, thoracic respiratory movement, and oxygen saturation with a pulse oximetry. Snoring was also monitored.  MEDICATIONS Patient self administered medications include: none reported.  SLEEP ARCHITECTURE Patient was studied for 555.0 minutes. The sleep efficiency was 94.4 % and the patient was supine for 93.3%. The arousal index was 0.0 per hour.  RESPIRATORY PARAMETERS The overall AHI was 8.0 per hour, with a central apnea index of 0.0 per hour.  The oxygen nadir was 76% during sleep.  CARDIAC DATA Mean heart rate during sleep was 78.3 bpm.  IMPRESSIONS - Mild obstructive sleep apnea occurred during this study (AHI = 8.0/h). - No significant central sleep apnea occurred during this study (CAI = 0.0/h). - Minimal oxygen desaturation was noted during this study (Min O2 = 76%). Mean 95%. Insignificant time - Patient snored.  DIAGNOSIS - Obstructive Sleep Apnea (327.23 [G47.33 ICD-10])  RECOMMENDATIONS - Observation may be sufficient, avoiding over-weight. Sleep off back. If conservative measures are insufficient, a fitted oral appliance or CPAP might be considered. - Be careful with alcohol, sedatives and other CNS depressants that may worsen sleep apnea and disrupt normal sleep architecture. - Sleep hygiene should be reviewed to assess factors that may improve sleep quality. - Weight management and  regular exercise should be initiated or continued.  [Electronically signed] 01/21/2018 02:18 PM  Baird Lyons MD, [place], American Board of Sleep Medicine   NPI: [id number]                           [place]  ELECTRONICALLY SIGNED ON:  01/21/2018, 2:13 PM [place] PH: [phone]   FX: [phone] [place]

## 2018-02-01 ENCOUNTER — Other Ambulatory Visit (HOSPITAL_COMMUNITY): Payer: Self-pay | Admitting: Psychiatry

## 2018-02-01 DIAGNOSIS — F201 Disorganized schizophrenia: Secondary | ICD-10-CM

## 2018-02-03 ENCOUNTER — Ambulatory Visit (HOSPITAL_COMMUNITY): Payer: Self-pay | Admitting: Psychiatry

## 2018-02-10 ENCOUNTER — Ambulatory Visit (INDEPENDENT_AMBULATORY_CARE_PROVIDER_SITE_OTHER): Payer: Medicaid Other | Admitting: Psychiatry

## 2018-02-10 ENCOUNTER — Encounter (HOSPITAL_COMMUNITY): Payer: Self-pay | Admitting: Psychiatry

## 2018-02-10 DIAGNOSIS — Z975 Presence of (intrauterine) contraceptive device: Secondary | ICD-10-CM

## 2018-02-10 DIAGNOSIS — Z818 Family history of other mental and behavioral disorders: Secondary | ICD-10-CM

## 2018-02-10 DIAGNOSIS — F201 Disorganized schizophrenia: Secondary | ICD-10-CM

## 2018-02-10 DIAGNOSIS — G4733 Obstructive sleep apnea (adult) (pediatric): Secondary | ICD-10-CM

## 2018-02-10 DIAGNOSIS — G4730 Sleep apnea, unspecified: Secondary | ICD-10-CM | POA: Insufficient documentation

## 2018-02-10 MED ORDER — OLANZAPINE 20 MG PO TABS: 20.0000 mg | ORAL_TABLET | Freq: Every day | ORAL | 0 refills | Status: DC

## 2018-02-10 MED ORDER — BENZTROPINE MESYLATE 1 MG PO TABS: 1.0000 mg | ORAL_TABLET | Freq: Two times a day (BID) | ORAL | 0 refills | Status: DC

## 2018-02-10 NOTE — Progress Notes (Signed)
[facility] MD/PA/NP OP Progress Note  02/10/2018 8:36 AM Natalie Wagner  MRN:  [id number]  Chief Complaint:  Chief Complaint    Schizophrenia; Follow-up     HPI: Here with mom. Mom states pt watches tv a lot.  Pt is tired all the time. She sleeps 6 hrs at night and naps 1-2 hrs/day. The rest of the day she eats and watches tv. her low energy cause low motivation to do anything.  She realizes she needs to exercise and lose weight. We discussed adding stucture to her day by working, volunteering or going to school. Pt denies depression and hopelessness. Pt denies SI/HI.   Pt denies AVH, ideas of reference and paranoia.   Pt states-taking meds as prescribed and denies SE.   Visit Diagnosis:    ICD-10-CM   1. 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 Asthma    prn inhaler  . Constipation 05/05/2013  . Difficulty swallowing pills   . Elevated prolactin level ([place]) 12/27/2014  . Galactorrhea 12/27/2014  . History of seizures as a child    mother states were triggered by migraines; no seizures in > 7 yr.  . 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 Migraines   . Schizophrenia [facility])     Past Surgical History:  Procedure Laterality Date  . COLONOSCOPY    . MRI     under anesthesia  . 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 UMBILICAL HERNIA  REPAIR      Family Psychiatric History:  Family History  Problem Relation Age of Onset  . Diabetes Maternal Grandfather   . Hypertension Maternal Grandfather   . Heart disease Maternal Grandfather   . Kidney disease Maternal Grandfather   . Anesthesia problems Maternal Grandfather        hx. of being hard to wake up post-op  . Hypertension Maternal Grandmother   . Stroke Maternal Grandmother   . Asthma Mother   . Hypertension Maternal Uncle   . Depression Sister     Social History:  Social History   Socioeconomic History  . Marital status: Single    Spouse name: Not on file  . Number of children: Not on file  . Years of education: Not on file  . Highest education level: Not on file  Occupational History  . Not on file  Social Needs  . Financial resource strain: Not on file  . Food insecurity:    Worry: Not on file    Inability: Not on file  . Transportation needs:    Medical: Not on file    Non-medical: Not on file  Tobacco Use  . Smoking status: Never Smoker  . Smokeless tobacco: Never Used  Substance and Sexual Activity  . Alcohol use: No    Alcohol/week: 0.0 oz  . Drug use: No  . Sexual activity: Never  Birth control/protection: IUD  Lifestyle  . Physical activity:    Days per week: Not on file    Minutes per session: Not on file  . Stress: Not on file  Relationships  . Social connections:    Talks on phone: Not on file    Gets together: Not on file    Attends religious service: Not on file    Active member of club or organization: Not on file    Attends meetings of clubs or organizations: Not on file    Relationship status: Not on file  Other Topics Concern  . Not on file  Social History Narrative  . Not on file    Allergies:  Allergies  Allergen Reactions  . Lactose Intolerance (Gi) Diarrhea    Metabolic Disorder Labs: Lab Results  Component Value Date   HGBA1C 5.4 12/02/2017   MPG 105 03/02/2017   MPG 94 07/27/2016   Lab Results   Component Value Date   PROLACTIN 48.8 (H) 12/02/2017   PROLACTIN 34.0 (H) 11/21/2016   Lab Results  Component Value Date   CHOL 195 12/02/2017   TRIG 74 12/02/2017   HDL 34 (L) 12/02/2017   CHOLHDL 4.8 03/02/2017   VLDL 16 03/02/2017   LDLCALC 146 (H) 12/02/2017   LDLCALC 137 (H) 03/02/2017   Lab Results  Component Value Date   TSH 3.470 12/02/2017   TSH 2.67 03/02/2017    Therapeutic Level Labs: No results found for: LITHIUM No results found for: VALPROATE No components found for:  CBMZ  Current Medications: Current Outpatient Medications  Medication Sig Dispense Refill  . albuterol (PROAIR HFA) 108 (90 Base) MCG/ACT inhaler Inhale 2 puffs into the lungs every 4 (four) hours as needed for wheezing or shortness of breath. 1 Inhaler 1  . benztropine (COGENTIN) 1 MG tablet Take 1 tablet (1 mg total) by mouth 2 (two) times daily. 180 tablet 0  . fluticasone (FLONASE) 50 MCG/ACT nasal spray Place 2 sprays into both nostrils 2 (two) times daily. 16 g 11  . hyoscyamine (LEVSIN SL) 0.125 MG SL tablet Place 1 tablet (0.125 mg total) under the tongue every 4 (four) hours as needed. 180 tablet 5  . Melatonin 5 MG TABS Take 1 tablet by mouth daily as needed (sleep).     . OLANZapine (ZYPREXA) 20 MG tablet Take 1 tablet (20 mg total) by mouth at bedtime. 90 tablet 0  . polyethylene glycol powder (GLYCOLAX/MIRALAX) powder Take 1/2 capful daily 255 g 3  . levonorgestrel (MIRENA) 20 MCG/24HR IUD 1 Intra Uterine Device (1 each total) by Intrauterine route once. 1 each 0  . Olopatadine HCl (PAZEO) 0.7 % SOLN Place 1 mL into both eyes daily as needed (allergies).     . topiramate (TOPAMAX) 25 MG tablet Take 1 tablet at night (Patient not taking: Reported on 02/10/2018) 90 tablet 3   No current facility-administered medications for this visit.      Musculoskeletal: Strength & Muscle Tone: within normal limits Gait & Station: normal Patient leans: N/A  Psychiatric Specialty Exam: Review  of Systems  Constitutional: Positive for malaise/fatigue. Negative for chills and fever.  HENT: Negative for congestion, ear pain, sinus pain and sore throat.     Blood pressure 124/74, pulse 88, height 4\' 11"  (1.499 m), weight 189 lb (85.7 kg).Body mass index is 38.17 kg/m.  General Appearance: Casual  Eye Contact:  Good  Speech:  Clear and Coherent and Normal Rate  Volume:  Normal  Mood:  Euthymic  Affect:  Blunt  Thought Process:  Coherent and Descriptions of Associations: Intact  Orientation:  Full (Time, Place, and Person)  Thought Content: Logical   Suicidal Thoughts:  No  Homicidal Thoughts:  No  Memory:  Immediate;   Good Recent;   Good Remote;   Good  Judgement:  Poor  Insight:  Shallow  Psychomotor Activity:  Normal  Concentration:  Concentration: Good and Attention Span: Good  Recall:  Good  Fund of Knowledge: Good  Language: Good  Akathisia:  No  Handed:  Right  AIMS (if indicated): not done  Assets:  Communication Skills Desire for Improvement Housing Leisure Time  ADL's:  Intact  Cognition: WNL  Sleep:  Good   Screenings: PHQ2-9     Office Visit from 03/22/2017 in [facility]  PHQ-2 Total Score  0      I reviewed the information below on 02/10/2018 and agree except where noted. Assessment and Plan: Schizophrenia-undifferentiated type; ODD; insomnia      Medication management with supportive therapy. Risks/benefits and SE of the medication discussed. Pt verbalized understanding and verbal consent obtained for treatment.  Affirm with the patient that the medications are taken as ordered. Patient expressed understanding of how their medications were to be used.    The risk of un-intended pregnancy is low based on the fact that pt reports she has an IUD. Pt is aware that these meds carry a teratogenic risk. Pt will discuss plan of action if she does or plans to become pregnant in the future.     Meds: Zyprexa 20 mg p.o. nightly for  schizophrenia Cogentin 1 mg p.o. twice daily to prevent EPS from antipsychotic use.      Labs: 12/02/2017 CBC shows platelets at 428, CMP shows glucose 101, HbA1c 5.4, Lipid panel shows elevated LDL, TSH WNL, Prolactin level 48.8     Therapy: brief supportive therapy provided. Discussed psychosocial stressors in detail.    Reviewed sleep study consult with pt- she has mild sleep apnea. We went over the recommendations and pt verbalized understanding   Consultations: recommended pt f/up with PCP regarding elevated LDL   Pt denies SI and is at an acute low risk for suicide. Patient told to call clinic if any problems occur. Patient advised to go to ER if they should develop SI/HI, side effects, or if symptoms worsen. Has crisis numbers to call if needed. Pt verbalized understanding.   F/up in 3 months or sooner if needed   [name], MD 02/10/2018, 8:36 AM

## 2018-05-12 ENCOUNTER — Other Ambulatory Visit (HOSPITAL_COMMUNITY): Payer: Self-pay | Admitting: Psychiatry

## 2018-05-12 DIAGNOSIS — F201 Disorganized schizophrenia: Secondary | ICD-10-CM

## 2018-05-18 ENCOUNTER — Other Ambulatory Visit: Payer: Self-pay

## 2018-05-18 ENCOUNTER — Encounter: Payer: Self-pay | Admitting: Neurology

## 2018-05-18 ENCOUNTER — Ambulatory Visit: Payer: Medicaid Other | Admitting: Neurology

## 2018-05-18 VITALS — BP 124/88 | HR 87 | Ht <= 58 in | Wt 194.0 lb

## 2018-05-18 DIAGNOSIS — G44209 Tension-type headache, unspecified, not intractable: Secondary | ICD-10-CM | POA: Diagnosis not present

## 2018-05-18 DIAGNOSIS — G4733 Obstructive sleep apnea (adult) (pediatric): Secondary | ICD-10-CM

## 2018-05-18 NOTE — Progress Notes (Signed)
NEUROLOGY FOLLOW UP OFFICE NOTE  Natalie Wagner [id number]  DOB: 05-12-1996  HISTORY OF PRESENT ILLNESS: I had the pleasure of seeing Natalie Wagner in follow-up in the neurology clinic on 05/18/2018. She is again accompanied by her mother who helps supplement the history today. The patient was last seen 5 months ago for headaches and new onset seizure last 08/26/15. Her mother previously reported concern for seizures as an infant, but she was never started on seizure medication. Imaging and 24-hour EEG were normal. No further seizures with loss of consciousness or shaking spells since October 2016. On her last visit, she was concerned that low dose Topamax was causing daytime drowsiness and opted to stop medication. She does feel it has helped some with the drowsiness, but she continues to have daytime drowsiness. Sleep study showed mild sleep apnea. She has not seen Sleep Medicine yet. She has not noticed any worsening of headaches off the Topamax, she has mild headaches occasionally and reports they are not a lot. She has some dizziness on standing. Her eyes feel like they are burning sometimes. No focal numbness/tingling/weakness. No falls. Mood is overall good, she continues with Psychiatry follow-up for schizophrenia.  HPI: This is a 22 yo RH woman with a history of schizophrenia who presented after a seizure last 08/26/15. She recalls waking up then starting to feel dizzy and lightheaded, then heard her mother screaming and waking up on the couch. Her mother reports she lost consciousness and started shaking, rocking side to side. No tongue bite or incontinence. She was brought to [facility] ER where CBC and BMP were normal, no urine drug screen done. I personally reviewed head CT without contrast which was normal. Her mother reports she was born premature at 44 weeks and had delayed development with occupation and speech therapy as a child. She was in special education classes. As a baby, she would  have episodes where she would get stiff as a board and shake, then in the 2nd grade she passed out with shaking and incontinence. She also had brief staring spells occurring around 3 times a week. She had seen pediatric neurologist Dr. Gaynell Face at that time, mother is unsure of diagnosis but denies any treatment for seizures. There is note that symptoms were felt to be due to migraines. The last staring spell was in the 7th grade. She was diagnosed with schizophrenia in the 9th grade, she was having visual hallucinations and would forget who she was, suddenly drinking from a fountain and spitting it out. She was admitted to inpatient psychiatry for 2 weeks and has been taking Zyprexa for the past 3 years. She denies any more hallucinations, but her mother is concerned about "strange behaviors" the past month. In the waiting room today, she was staring at the form and her mother told her to write something down. She then wrote over what her mother had filled out. She talks as if there is a third person in the room, sometimes she "just talks and does not know it." She closes and opens doors repetitively. She lives with her mother and denies any alcohol intake. She reports good sleep.   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 There is no history of febrile convulsions, CNS infections such as meningitis/encephalitis, significant traumatic brain injury, neurosurgical procedures, or family history of seizures.  Diagnostic Data: MRI brain with and without contrast done 01/14/15 for galactorrhea. I personally reviewed images, no acute changes, hippocampi symmetric with no  abnormal signal or enhancement seen. Pituitary gland normal. EEG as above  I personally reviewed MRI brain with and without contrast 10/21/15 which was normal, hippocampi symmetric with no abnormal signal or enhancement seen. Her 1-hour sleep-deprived EEG, as well as 24-hour EEG were normal. Typical events were not  captured.    PAST MEDICAL HISTORY: Past Medical History:  Diagnosis Date  . Asthma    prn inhaler  . Constipation 05/05/2013  . Difficulty swallowing pills   . Elevated prolactin level ([place]) 12/27/2014  . Galactorrhea 12/27/2014  . History of seizures as a child    mother states were triggered by migraines; no seizures in > 7 yr.  . 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 Migraines   . Schizophrenia [facility])     MEDICATIONS: Current Outpatient Medications on File Prior to Visit  Medication Sig Dispense Refill  . albuterol (PROAIR HFA) 108 (90 Base) MCG/ACT inhaler Inhale 2 puffs into the lungs every 4 (four) hours as needed for wheezing or shortness of breath. 1 Inhaler 1  . benztropine (COGENTIN) 1 MG tablet Take 1 tablet (1 mg total) by mouth 2 (two) times daily. 180 tablet 0  . fluticasone (FLONASE) 50 MCG/ACT nasal spray Place 2 sprays into both nostrils 2 (two) times daily. 16 g 11  . hyoscyamine (LEVSIN SL) 0.125 MG SL tablet Place 1 tablet (0.125 mg total) under the tongue every 4 (four) hours as needed. 180 tablet 5  . Melatonin 5 MG TABS Take 1 tablet by mouth daily as needed (sleep).     . OLANZapine (ZYPREXA) 20 MG tablet Take 1 tablet (20 mg total) by mouth at bedtime. 90 tablet 0  . Olopatadine HCl (PAZEO) 0.7 % SOLN Place 1 mL into both eyes daily as needed (allergies).     . polyethylene glycol powder (GLYCOLAX/MIRALAX) powder Take 1/2 capful daily 255 g 3  . topiramate (TOPAMAX) 25 MG tablet Take 1 tablet at night 90 tablet 3  . levonorgestrel (MIRENA) 20 MCG/24HR IUD 1 Intra Uterine Device (1 each total) by Intrauterine route once. 1 each 0   No current facility-administered medications on file prior to visit.     ALLERGIES: Allergies  Allergen Reactions  . Lactose Intolerance (Gi) Diarrhea    FAMILY HISTORY: Family History  Problem Relation Age of Onset  . Diabetes Maternal Grandfather   .  Hypertension Maternal Grandfather   . Heart disease Maternal Grandfather   . Kidney disease Maternal Grandfather   . Anesthesia problems Maternal Grandfather        hx. of being hard to wake up post-op  . Hypertension Maternal Grandmother   . Stroke Maternal Grandmother   . Asthma Mother   . Hypertension Maternal Uncle   . Depression Sister     SOCIAL HISTORY: Social History   Socioeconomic History  . Marital status: Single    Spouse name: Not on file  . Number of children: Not on file  . Years of education: Not on file  . Highest education level: Not on file  Occupational History  . Not on file  Social Needs  . Financial resource strain: Not on file  . Food insecurity:    Worry: Not on file    Inability: Not on file  . Transportation needs:    Medical: Not on file    Non-medical: Not on file  Tobacco Use  . Smoking status: Never Smoker  .  Smokeless tobacco: Never Used  Substance and Sexual Activity  . Alcohol use: No    Alcohol/week: 0.0 oz  . Drug use: No  . Sexual activity: Never    Birth control/protection: IUD  Lifestyle  . Physical activity:    Days per week: Not on file    Minutes per session: Not on file  . Stress: Not on file  Relationships  . Social connections:    Talks on phone: Not on file    Gets together: Not on file    Attends religious service: Not on file    Active member of club or organization: Not on file    Attends meetings of clubs or organizations: Not on file    Relationship status: Not on file  . Intimate partner violence:    Fear of current or ex partner: Not on file    Emotionally abused: Not on file    Physically abused: Not on file    Forced sexual activity: Not on file  Other Topics Concern  . Not on file  Social History Narrative  . Not on file    REVIEW OF SYSTEMS: Constitutional: No fevers, chills, or sweats, no generalized fatigue, change in appetite Eyes: No visual changes, double vision, eye pain Ear, nose and  throat: No hearing loss, ear pain, nasal congestion, sore throat Cardiovascular: No chest pain, palpitations Respiratory:  No shortness of breath at rest or with exertion, wheezes GastrointestinaI: No nausea, vomiting, diarrhea, abdominal pain, fecal incontinence Genitourinary:  No dysuria, urinary retention or frequency Musculoskeletal:  No neck pain, back pain Integumentary: No rash, pruritus, skin lesions Neurological: as above Psychiatric: No depression, insomnia, anxiety Endocrine: No palpitations, fatigue, diaphoresis, mood swings, change in appetite, change in weight, increased thirst Hematologic/Lymphatic:  No anemia, purpura, petechiae. Allergic/Immunologic: No itchy/runny eyes, nasal congestion, no recent allergic reactions, rashes  PHYSICAL EXAM: Vitals:   05/18/18 1117  BP: 124/88  Pulse: 87  SpO2: 98%   General: No acute distress Head:  Normocephalic/atraumatic Neck: supple, no paraspinal tenderness, full range of motion Heart:  Regular rate and rhythm Lungs:  Clear to auscultation bilaterally Back: No paraspinal tenderness Skin/Extremities: No rash, no edema Neurological Exam: alert and oriented to person, place, and time. No aphasia or dysarthria. Fund of knowledge is appropriate.  Recent and remote memory are intact.  Attention and concentration are normal.    Able to name objects and repeat phrases. Cranial nerves: Pupils equal, round, reactive to light. Fundoscopy shows sharp discs bilaterally. Extraocular movements intact with no nystagmus. Visual fields full. Facial sensation intact. No facial asymmetry. Tongue, uvula, palate midline.  Motor: Bulk and tone normal, muscle strength 5/5 throughout with no pronator drift.  Sensation to light touch intact.  No extinction to double simultaneous stimulation.  Deep tendon reflexes 2+ throughout, toes downgoing.  Finger to nose testing intact.  Gait narrow-based and steady, able to tandem walk adequately.  Romberg  negative.  IMPRESSION: This is a 22 yo RH woman with a history of schizophrenia, with a witnessed convulsion last 08/26/15. Her mother reported episodes concerning for possible seizures as a child (staring spells, stiffening episodes), however she was never started on seizure medication by pediatric neurology. Her 24-hour EEG is normal. MRI brain normal. No further similar symptoms since October 2016. Her main concern on recent visits have been headaches, she had initial good response to low dose Topamax but opted to stop it due to daytime drowsiness. This has helped some but she continues to have issues.  Sleep study showed mild sleep apnea, referral to Sleep Medicine will be sent again today. She has not noticed significant worsening of headaches off Topamax, continue headache calendar. She does not drive and is aware of [place] driving laws to stop driving after an episode of loss of consciousness, until 6 months event-free. She will follow-up in 6 months and knows to call for any changes.   Thank you for allowing me to participate in her care.  Please do not hesitate to call for any questions or concerns.  The duration of this appointment visit was 20 minutes of face-to-face time with the patient.  Greater than 50% of this time was spent in counseling, explanation of diagnosis, planning of further management, and coordination of care.   Ellouise Newer, M.D.   CC: Dr. Vanetta Shawl

## 2018-05-18 NOTE — Patient Instructions (Signed)
1. Refer to Sleep Medicine for mild sleep apnea 2. Continue calendar of headaches 3. Follow-up in 6 months, call for any changes

## 2018-05-19 ENCOUNTER — Encounter (HOSPITAL_COMMUNITY): Payer: Self-pay | Admitting: Psychiatry

## 2018-05-19 ENCOUNTER — Ambulatory Visit (INDEPENDENT_AMBULATORY_CARE_PROVIDER_SITE_OTHER): Payer: Medicaid Other | Admitting: Psychiatry

## 2018-05-19 VITALS — BP 112/73 | HR 83 | Ht <= 58 in | Wt 190.8 lb

## 2018-05-19 DIAGNOSIS — F913 Oppositional defiant disorder: Secondary | ICD-10-CM | POA: Diagnosis not present

## 2018-05-19 DIAGNOSIS — G4701 Insomnia due to medical condition: Secondary | ICD-10-CM | POA: Diagnosis not present

## 2018-05-19 DIAGNOSIS — F201 Disorganized schizophrenia: Secondary | ICD-10-CM

## 2018-05-19 MED ORDER — OLANZAPINE 20 MG PO TABS: 20.0000 mg | ORAL_TABLET | Freq: Every day | ORAL | 0 refills | Status: DC

## 2018-05-19 MED ORDER — BENZTROPINE MESYLATE 1 MG PO TABS: 1.0000 mg | ORAL_TABLET | Freq: Two times a day (BID) | ORAL | 0 refills | Status: DC

## 2018-05-19 NOTE — Progress Notes (Signed)
BH MD/PA/NP OP Progress Note  05/19/2018 9:24 AM RAYLEEN WYRICK  MRN:  [id number]  Chief Complaint:  Chief Complaint    Follow-up     HPI: Here with her mom. Pt states her mood is "chill". Pt denies irritability and verbal outbursts. Pt denies depression and anhedonia. Dorismar spends her days watching tv. She is sleeping well but is always tired. Quinlee denies anxiety. She denies SI/HI. Pt denies AVH/paranoia/ideas of reference. Pt states her diet is really bad and she gaining weight.  Pt states-taking meds as prescribed and reports SE of dry mouth.   Visit Diagnosis:    ICD-10-CM   1. 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 Oppositional defiant disorder F91.3   3. Insomnia due to medical condition G47.01        Past Psychiatric History:  Anxiety: No Bipolar Disorder: No Depression: No Mania: No Psychosis: Yes Schizophrenia: Yes Personality Disorder: No Hospitalization for psychiatric illness: Yes History of Electroconvulsive Shock Therapy: No Prior Suicide Attempts: Yes   Past Medical History:  Past Medical History:  Diagnosis Date  . Asthma    prn inhaler  . Constipation 05/05/2013  . Difficulty swallowing pills   . Elevated prolactin level ([place]) 12/27/2014  . Galactorrhea 12/27/2014  . History of seizures as a child    mother states were triggered by migraines; no seizures in > 7 yr.  . 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 Migraines   . Schizophrenia [facility])     Past Surgical History:  Procedure Laterality Date  . COLONOSCOPY    . MRI     under anesthesia  . 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 UMBILICAL HERNIA REPAIR      Family  Psychiatric History:  Family History  Problem Relation Age of Onset  . Diabetes Maternal Grandfather   . Hypertension Maternal Grandfather   . Heart disease Maternal Grandfather   . Kidney disease Maternal Grandfather   . Anesthesia problems Maternal Grandfather        hx. of being hard to wake up post-op  . Hypertension Maternal Grandmother   . Stroke Maternal Grandmother   . Asthma Mother   . Hypertension Maternal Uncle   . Depression Sister     Social History:  Social History   Socioeconomic History  . Marital status: Single    Spouse name: Not on file  . Number of children: Not on file  . Years of education: Not on file  . Highest education level: Not on file  Occupational History  . Not on file  Social Needs  . Financial resource strain: Not on file  . Food insecurity:    Worry: Not on file    Inability: Not on file  . Transportation needs:    Medical: Not on file    Non-medical: Not on file  Tobacco Use  . Smoking status: Never Smoker  . Smokeless tobacco: Never Used  Substance and Sexual Activity  . Alcohol use: No    Alcohol/week: 0.0 oz  . Drug use: No  . Sexual activity: Never    Birth control/protection: IUD  Lifestyle  . Physical activity:    Days  per week: Not on file    Minutes per session: Not on file  . Stress: Not on file  Relationships  . Social connections:    Talks on phone: Not on file    Gets together: Not on file    Attends religious service: Not on file    Active member of club or organization: Not on file    Attends meetings of clubs or organizations: Not on file    Relationship status: Not on file  Other Topics Concern  . Not on file  Social History Narrative  . Not on file    Allergies:  Allergies  Allergen Reactions  . Lactose Intolerance (Gi) Diarrhea    Metabolic Disorder Labs: Lab Results  Component Value Date   HGBA1C 5.4 12/02/2017   MPG 105 03/02/2017   MPG 94 07/27/2016   Lab Results  Component Value Date    PROLACTIN 48.8 (H) 12/02/2017   PROLACTIN 34.0 (H) 11/21/2016   Lab Results  Component Value Date   CHOL 195 12/02/2017   TRIG 74 12/02/2017   HDL 34 (L) 12/02/2017   CHOLHDL 4.8 03/02/2017   VLDL 16 03/02/2017   LDLCALC 146 (H) 12/02/2017   LDLCALC 137 (H) 03/02/2017   Lab Results  Component Value Date   TSH 3.470 12/02/2017   TSH 2.67 03/02/2017    Therapeutic Level Labs: No results found for: LITHIUM No results found for: VALPROATE No components found for:  CBMZ  Current Medications: Current Outpatient Medications  Medication Sig Dispense Refill  . albuterol (PROAIR HFA) 108 (90 Base) MCG/ACT inhaler Inhale 2 puffs into the lungs every 4 (four) hours as needed for wheezing or shortness of breath. 1 Inhaler 1  . benztropine (COGENTIN) 1 MG tablet Take 1 tablet (1 mg total) by mouth 2 (two) times daily. 180 tablet 0  . fluticasone (FLONASE) 50 MCG/ACT nasal spray Place 2 sprays into both nostrils 2 (two) times daily. 16 g 11  . hyoscyamine (LEVSIN SL) 0.125 MG SL tablet Place 1 tablet (0.125 mg total) under the tongue every 4 (four) hours as needed. 180 tablet 5  . Melatonin 5 MG TABS Take 1 tablet by mouth daily as needed (sleep).     . OLANZapine (ZYPREXA) 20 MG tablet Take 1 tablet (20 mg total) by mouth at bedtime. 90 tablet 0  . Olopatadine HCl (PAZEO) 0.7 % SOLN Place 1 mL into both eyes daily as needed (allergies).     . polyethylene glycol powder (GLYCOLAX/MIRALAX) powder Take 1/2 capful daily 255 g 3  . topiramate (TOPAMAX) 25 MG tablet Take 1 tablet at night 90 tablet 3  . levonorgestrel (MIRENA) 20 MCG/24HR IUD 1 Intra Uterine Device (1 each total) by Intrauterine route once. 1 each 0   No current facility-administered medications for this visit.      Musculoskeletal: Strength & Muscle Tone: within normal limits Gait & Station: normal Patient leans: straight  Psychiatric Specialty Exam: Review of Systems  Constitutional: Negative for chills, diaphoresis  and fever.  Neurological: Positive for tingling. Negative for tremors and speech change.       Tingling in her fingers in both hands    Blood pressure 112/73, pulse 83, height 4\' 10"  (1.473 m), weight 190 lb 12.8 oz (86.5 kg), SpO2 98 %.Body mass index is 39.88 kg/m.  General Appearance: Fairly Groomed  Eye Contact:  Fair  Speech:  Clear and Coherent and Normal Rate  Volume:  Normal  Mood:  Euthymic  Affect:  Blunt  Thought Process:  Coherent and Descriptions of Associations: Intact  Orientation:  Full (Time, Place, and Person)  Thought Content: Logical   Suicidal Thoughts:  No  Homicidal Thoughts:  No  Memory:  Immediate;   Fair Recent;   Fair Remote;   Fair  Judgement:  Poor  Insight:  Shallow  Psychomotor Activity:  Normal  Concentration:  Concentration: Fair and Attention Span: Fair  Recall:  [employer] of Knowledge: Fair  Language: Fair  Akathisia:  No  Handed:  Right  AIMS (if indicated): not done  Assets:  Desire for Improvement Housing Leisure Time Social Support Transportation  ADL's:  Intact  Cognition: WNL  Sleep:  Fair   Screenings: PHQ2-9     Office Visit from 03/22/2017 in [facility]  PHQ-2 Total Score  0      I reviewed the information below on 05/19/2018 and agree except where noted/changed Assessment and Plan: Schizophrenia-undifferentiated type; ODD; insomnia      Medication management with supportive therapy. Risks/benefits and SE of the medication discussed. Pt verbalized understanding and verbal consent obtained for treatment.  Affirm with the patient that the medications are taken as ordered. Patient expressed understanding of how their medications were to be used.    The risk of un-intended pregnancy is low based on the fact that pt reports she has an IUD. Pt is aware that these meds carry a teratogenic risk. Pt will discuss plan of action if she does or plans to become pregnant in the future.     Meds: Zyprexa 20 mg  p.o. nightly for schizophrenia Cogentin 1 mg p.o. twice daily to prevent EPS from antipsychotic use.      Labs: today I again reviewed labs with pt- 12/02/2017 CBC shows platelets at 428, CMP shows glucose 101, HbA1c 5.4, Lipid panel shows elevated LDL, TSH WNL, Prolactin level 48.8  We discussed the need for diet modification related to labs and weight gain   Therapy: brief supportive therapy provided. Discussed psychosocial stressors in detail.   At our last visit we reviewed sleep study consult with pt- she has mild sleep apnea. We went over the recommendations and pt verbalized understanding   Consultations: recommended pt f/up with PCP regarding elevated LDL   Pt denies SI and is at an acute low risk for suicide. Patient told to call clinic if any problems occur. Patient advised to go to ER if they should develop SI/HI, side effects, or if symptoms worsen. Has crisis numbers to call if needed. Pt verbalized understanding.   F/up in 3 months or sooner if needed   [name], MD 05/19/2018, 9:24 AM

## 2018-07-06 ENCOUNTER — Ambulatory Visit (INDEPENDENT_AMBULATORY_CARE_PROVIDER_SITE_OTHER): Payer: Medicaid Other | Admitting: Pulmonary Disease

## 2018-07-06 ENCOUNTER — Encounter: Payer: Self-pay | Admitting: Pulmonary Disease

## 2018-07-06 VITALS — BP 112/64 | HR 78 | Ht 59.0 in | Wt 190.8 lb

## 2018-07-06 DIAGNOSIS — Z6835 Body mass index (BMI) 35.0-35.9, adult: Secondary | ICD-10-CM

## 2018-07-06 DIAGNOSIS — G4733 Obstructive sleep apnea (adult) (pediatric): Secondary | ICD-10-CM

## 2018-07-06 NOTE — Patient Instructions (Signed)
Will arrange for CPAP set up  Follow up in 2 months after CPAP set up 

## 2018-07-06 NOTE — Progress Notes (Signed)
   Subjective:    Patient ID: Natalie Wagner, female    DOB: 1996/05/07, 22 y.o.   MRN: [id number]  HPI    Review of Systems  Constitutional: Positive for unexpected weight change. Negative for fever.  HENT: Positive for nosebleeds, postnasal drip and sneezing. Negative for congestion, dental problem, ear pain, rhinorrhea, sinus pressure, sore throat and trouble swallowing.   Eyes: Positive for itching. Negative for redness.  Respiratory: Positive for chest tightness. Negative for cough, shortness of breath and wheezing.   Cardiovascular: Positive for leg swelling. Negative for palpitations.  Gastrointestinal: Positive for nausea. Negative for vomiting.  Genitourinary: Negative for dysuria.  Musculoskeletal: Negative for joint swelling.  Skin: Negative for rash.  Allergic/Immunologic: Negative.  Negative for environmental allergies, food allergies and immunocompromised state.  Neurological: Negative for headaches.  Hematological: Does not bruise/bleed easily.  Psychiatric/Behavioral: Positive for dysphoric mood. The patient is nervous/anxious.        Objective:   Physical Exam        Assessment & Plan:

## 2018-07-06 NOTE — Progress Notes (Signed)
[facility]  Chief Complaint  Patient presents with  . sleep consult    Referred by [facility] due to mild OSA. Pt had sleep study performed 01/18/18. Pt states when she wakes up in the morning, she feels like she has not slept and she also states she does snore.    Constitutional: BP 112/64 (BP Location: Left Arm, Cuff Size: Normal)   Pulse 78   Ht 4\' 11"  (1.499 m)   Wt 190 lb 12.8 oz (86.5 kg)   SpO2 97%   BMI 38.54 kg/m   History of Present Illness: Natalie Wagner is a 22 y.o. female with obstructive sleep apnea.  She is followed by neurology for headaches.  It was noted that she snores and stops breathing at night.  She has been getting more tired during the day, especially since she has gained weight.  Her mouth gets dry at night.  She sleeps on her stomach.  She doesn't dream much.  She falls asleep easily during the day.  She used to have trouble staying focused at night.  She goes to sleep at 930 pm.  She falls asleep in 15 minutes.  She wakes up 2 or 3 times to use the bathroom.  She gets out of bed at 730 am.  She feels tired in the morning.  She gets headaches later in the day.  She does not use anything to help her stay awake.  She has used melatonin to help sleep.  She denies sleep walking, sleep talking, bruxism, or nightmares.  There is no history of restless legs.  She denies sleep hallucinations, sleep paralysis, or cataplexy.  The Epworth score is 10 out of 24.   Comprehensive Respiratory Exam:  Appearance - well kempt  ENMT - nasal mucosa moist, turbinates clear, midline nasal septum, no dental lesions, no gingival bleeding, no oral exudates, no tonsillar hypertrophy, MP 4, enlarged tongue, elongated uvula Neck - no masses, trachea midline, no thyromegaly, no elevation in JVP Respiratory - normal appearance of chest wall, normal respiratory effort w/o accessory muscle use, no dullness on percussion, no wheezing or rales CV  - s1s2 regular rate and rhythm, no murmurs, no peripheral edema, radial pulses symmetric GI - soft, non tender, no masses, no hepatosplenomegaly Lymph - no adenopathy noted in neck and axillary areas MSK - normal muscle strength and tone, normal gait Ext - no cyanosis, clubbing, or joint inflammation noted Skin - mobile growth on ring finger of left hand Neuro - oriented to person, place, and time Psych - normal mood and affect  Discussion: She has snoring, sleep disruption, witnessed apnea, and daytime sleepiness.  She has history of mood disorder and frequent headaches.  Her BMI is > 35.  Her recent sleep study showed mild obstructive sleep apnea with oxygen desaturation.  Assessment/Plan:  Snoring with excessive daytime sleepiness from obstructive sleep apnea. - various therapies for treatment were reviewed: CPAP, oral appliance, and surgical interventions - she is agreeable to try CPAP - will arrange for auto CPAP set up - discussed mask fit options  Obesity. - discussed how weight can impact sleep and risk for sleep disordered breathing - discussed options to assist with weight loss: combination of diet modification, cardiovascular and strength training exercises - will arrange for referral to nutritionist to assist with weight loss  Cardiovascular risk. - had an extensive discussion regarding the adverse health consequences related to untreated sleep disordered breathing - specifically discussed the risks  for hypertension, coronary artery disease, cardiac dysrhythmias, cerebrovascular disease, and diabetes - lifestyle modification discussed  Safe driving practices. - discussed how sleep disruption can increase risk of accidents, particularly when driving - safe driving practices were discussed  Growth on ring finger of left hand. - she will f/u with PCP to arrange for referral to dermatology   Patient Instructions  Will arrange for CPAP set up  Follow up in 2 months  after CPAP set up    Chesley Mires, MD [place] 07/06/2018, 3:09 PM  Flow Sheet  Sleep tests: HST 01/12/18 >> AHI 8, SpO2 low 76%  Review of Systems: Constitutional: Positive for unexpected weight change. Negative for fever.  HENT: Positive for nosebleeds, postnasal drip and sneezing. Negative for congestion, dental problem, ear pain, rhinorrhea, sinus pressure, sore throat and trouble swallowing.   Eyes: Positive for itching. Negative for redness.  Respiratory: Positive for chest tightness. Negative for cough, shortness of breath and wheezing.   Cardiovascular: Positive for leg swelling. Negative for palpitations.  Gastrointestinal: Positive for nausea. Negative for vomiting.  Genitourinary: Negative for dysuria.  Musculoskeletal: Negative for joint swelling.  Skin: Negative for rash.  Allergic/Immunologic: Negative.  Negative for environmental allergies, food allergies and immunocompromised state.  Neurological: Negative for headaches.  Hematological: Does not bruise/bleed easily.  Psychiatric/Behavioral: Positive for dysphoric mood. The patient is nervous/anxious.    Past Medical History: She  has a past medical history of Asthma, Constipation (05/05/2013), Difficulty swallowing pills, Elevated prolactin level ([place]) (12/27/2014), Galactorrhea (12/27/2014), History of seizures as a child, Hypertrophy, vulva (05/23/2013), Mass of finger of left hand (05/2014), Migraines, and Schizophrenia ([place]).  Past Surgical History: She  has a past surgical history that includes Umbilical hernia repair; MRI; Colonoscopy; and Tendon repair (Left, 05/28/2014).  Family History: Her family history includes Anesthesia problems in her maternal grandfather; Asthma in her mother; Depression in her sister; Diabetes in her maternal grandfather; Heart disease in her maternal grandfather; Hypertension in her maternal grandfather, maternal grandmother, and maternal uncle; Kidney disease in her  maternal grandfather; Stroke in her maternal grandmother.  Social History: She  reports that she has never smoked. She has never used smokeless tobacco. She reports that she does not drink alcohol or use drugs.  Medications: Allergies as of 07/06/2018      Reactions   Lactose Intolerance (gi) Diarrhea      Medication List        Accurate as of 07/06/18  3:09 PM. Always use your most recent med list.          albuterol 108 (90 Base) MCG/ACT inhaler Commonly known as:  PROVENTIL HFA;VENTOLIN HFA Inhale 2 puffs into the lungs every 4 (four) hours as needed for wheezing or shortness of breath.   benztropine 1 MG tablet Commonly known as:  COGENTIN Take 1 tablet (1 mg total) by mouth 2 (two) times daily.   fluticasone 50 MCG/ACT nasal spray Commonly known as:  FLONASE Place 2 sprays into both nostrils 2 (two) times daily.   hyoscyamine 0.125 MG SL tablet Commonly known as:  LEVSIN SL Place 1 tablet (0.125 mg total) under the tongue every 4 (four) hours as needed.   levonorgestrel 20 MCG/24HR IUD Commonly known as:  MIRENA 1 Intra Uterine Device (1 each total) by Intrauterine route once.   Melatonin 5 MG Tabs Take 1 tablet by mouth daily as needed (sleep).   OLANZapine 20 MG tablet Commonly known as:  ZYPREXA Take 1 tablet (20 mg total) by  mouth at bedtime.   PAZEO 0.7 % Soln Generic drug:  Olopatadine HCl Place 1 mL into both eyes daily as needed (allergies).   polyethylene glycol powder powder Commonly known as:  GLYCOLAX/MIRALAX Take 1/2 capful daily   topiramate 25 MG tablet Commonly known as:  TOPAMAX Take 1 tablet at night

## 2018-07-18 ENCOUNTER — Telehealth: Payer: Self-pay | Admitting: Pulmonary Disease

## 2018-07-18 DIAGNOSIS — G4733 Obstructive sleep apnea (adult) (pediatric): Secondary | ICD-10-CM

## 2018-07-18 NOTE — Telephone Encounter (Signed)
Called pt and advised message from the provider. Pt understood and verbalized understanding. Nothing further is needed.   She agreed to have split night sleep study.

## 2018-07-18 NOTE — Telephone Encounter (Signed)
Please let her know that insurance will not approve therapy based on home sleep study.  If she is agreeable, then please enter order for split night in lab sleep study.

## 2018-07-18 NOTE — Telephone Encounter (Signed)
Patients medicaid is not going to cover in home sleep study. Patient will need an in lab sleep study first.    VS please advise if this is ok to order. Thank you.

## 2018-08-02 ENCOUNTER — Encounter: Payer: Self-pay | Admitting: Registered"

## 2018-08-02 ENCOUNTER — Encounter: Payer: Medicaid Other | Attending: Pulmonary Disease | Admitting: Registered"

## 2018-08-02 DIAGNOSIS — Z6835 Body mass index (BMI) 35.0-35.9, adult: Secondary | ICD-10-CM | POA: Insufficient documentation

## 2018-08-02 DIAGNOSIS — G4733 Obstructive sleep apnea (adult) (pediatric): Secondary | ICD-10-CM | POA: Insufficient documentation

## 2018-08-02 DIAGNOSIS — Z713 Dietary counseling and surveillance: Secondary | ICD-10-CM | POA: Diagnosis not present

## 2018-08-02 DIAGNOSIS — E669 Obesity, unspecified: Secondary | ICD-10-CM

## 2018-08-02 NOTE — Progress Notes (Signed)
  Medical Nutrition Therapy:  Appt start time: 9:00 end time: 10:00.   Assessment:  Primary concerns today: Pt arrives with mom. Mom states she works a lot while pt is at home. Mom states they have been eating out for the last 5 months because pt's grandma became sick. Pt's mom states she has been unable to prepare food. Pt's mom is paying for 2 households and sometimes having issues providing enough food for family from month to month.   Pt expectations: meal planning/prepping ideas, get back on track with exercise   Pt states she enjoyed playing dodgeball and volleyball when in high school; now enjoys watching tv. Pt states she also likes to cook.   Pt states her mouth is typically dry in the morning after taking medications at night; drinks a lot of fluid. Pt states she helps take care of grandma in the mornings at times.   Pt states soy milk and lactose-free milk cause stomach issues when she drinks a lot of it. Pt states she loves vegetables: cauliflower, spinach, carrots.   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   Preferred Learning Style:   No preference indicated   Learning Readiness:   Contemplating  Ready  Change in progress   MEDICATIONS: See list   DIETARY INTAKE:  Usual eating pattern includes 2 meals and 3-4 snacks per day.  Everyday foods include convenience foods, cookies, candy, fast food, juice, and water.  Avoided foods include milk.    24-hr recall:  B ( AM): sometimes skips; fast food-bacon biscuits + grits or sausage + boiled eggs   Snk ( AM): none  L ( PM): fast food-chicken/burger + fries or frozen meal or popcorn, candy, cookies Snk ( PM): candy, cookies, and fruit sometimes D ( PM): fast food-chicken/burger + fries or frozen meal or popcorn, candy, cookies Snk ( PM): none Beverages: water, cranberry/orange/apple juice, soda  Usual physical activity: none stated  Estimated energy needs: 2000  calories 225 g carbohydrates 150 g protein 56 g fat  Progress Towards Goal(s):  In progress.   Nutritional Diagnosis:  NI-5.8.5 Inadeqate fiber intake As related to less than optimal food-preparation practices.  As evidenced by pt report of reliance on convenience, overprocessed foods.    Intervention:  Nutrition education and counseling. Pt was educated and counseled on the benefits of increasing fiber intake, eating balanced meals, preparing more meals at home, and My Plate. Pt was in agreement with goals listed.  Goals: - Aim to eat 3 meals a day.  - Aim to have breakfast daily. Use My Plate handout as guide.  - Increase vegetable intake with lunch and dinner.  - Prepare dinner at home.  - Try preparing salads for lunch instead of eating out.   Teaching Method Utilized:  Visual Auditory Hands on  Handouts given during visit include:  My Plate  Barriers to learning/adherence to lifestyle change: contemplative stage of change  Demonstrated degree of understanding via:  Teach Back   Monitoring/Evaluation:  Dietary intake, exercise, and body weight in 1 month(s).

## 2018-08-02 NOTE — Patient Instructions (Addendum)
-   Aim to eat 3 meals a day.   - Aim to have breakfast daily. Use My Plate handout as guide.   - Increase vegetable intake with lunch and dinner.   - Prepare dinner at home.   - Try preparing salads for lunch instead of eating out.

## 2018-08-15 ENCOUNTER — Ambulatory Visit (HOSPITAL_BASED_OUTPATIENT_CLINIC_OR_DEPARTMENT_OTHER): Payer: Medicaid Other | Attending: Pulmonary Disease | Admitting: Pulmonary Disease

## 2018-08-15 VITALS — Ht <= 58 in | Wt 192.0 lb

## 2018-08-15 DIAGNOSIS — G4733 Obstructive sleep apnea (adult) (pediatric): Secondary | ICD-10-CM | POA: Diagnosis not present

## 2018-08-15 DIAGNOSIS — R0683 Snoring: Secondary | ICD-10-CM | POA: Insufficient documentation

## 2018-08-16 ENCOUNTER — Telehealth: Payer: Self-pay | Admitting: Pulmonary Disease

## 2018-08-16 DIAGNOSIS — G4733 Obstructive sleep apnea (adult) (pediatric): Secondary | ICD-10-CM

## 2018-08-16 NOTE — Procedures (Signed)
    Patient Name: Natalie Wagner, Natalie Wagner Date: 08/15/2018   Gender: Female  D.O.B: [birth year]  Age (years): 22  Referring Provider: Chesley Mires MD, ABSM  Height (inches): 70  Interpreting Physician: Chesley Mires MD, ABSM  Weight (lbs): 192  RPSGT: Jorge Ny  BMI: 25  MRN: [id number]  Neck Size: 14.00  <br> <br>  CLINICAL INFORMATION  Sleep Study Type: NPSG Indication for sleep study: Obesity, OSA, Snoring Epworth Sleepiness Score: 17  Most recent home sleep study dated 01/12/2018 revealed an AHI of 8.0/h. Unfortunately medicaid does not accept home sleep studies. As a result she presents for an in lab sleep study. SLEEP STUDY TECHNIQUE  As per the AASM Manual for the Scoring of Sleep and Associated Events v2.3 (April 2016) with a hypopnea requiring 4% desaturations. The channels recorded and monitored were frontal, central and occipital EEG, electrooculogram (EOG), submentalis EMG (chin), nasal and oral airflow, thoracic and abdominal wall motion, anterior tibialis EMG, snore microphone, electrocardiogram, and pulse oximetry. MEDICATIONS  Medications self-administered by patient taken the night of the study : PROAIR HFA, BENZTROPINE, HYSOCYAMINE, [place]  The study was initiated at 9:56:55 PM and ended at 4:53:04 AM. Sleep onset time was 11.4 minutes and the sleep efficiency was 96.2%%. The total sleep time was 400.5 minutes. Stage REM latency was 176.5 minutes. The patient spent 0.6%% of the night in stage N1 sleep, 41.8%% in stage N2 sleep, 47.6%% in stage N3 and 10% in REM. Alpha intrusion was absent. Supine sleep was 100.00%. RESPIRATORY PARAMETERS  The overall apnea/hypopnea index (AHI) was 3.9 per hour. There were 13 total apneas, including 4 obstructive, 9 central and 0 mixed apneas. There were 13 hypopneas and 0 RERAs. The AHI during Stage REM sleep was 6.0 per hour. AHI while supine was 3.9 per hour. The mean oxygen saturation was 94.0%. The  minimum SpO2 during sleep was 87.0%. moderate snoring was noted during this study. CARDIAC DATA  The 2 lead EKG demonstrated sinus rhythm. The mean heart rate was 75.1 beats per minute. Other EKG findings include: PVCs.  LEG MOVEMENT DATA  The total PLMS were 0 with a resulting PLMS index of 0.0. Associated arousal with leg movement index was 0.6 . IMPRESSIONS  - While she had severe obstructive respiratory events these were not frequent enough to meet the diagnostic criteris for sleep apnea. Her overall AHI was 3.9 with an SpO2 low of 87%. - The patient snored with moderate snoring volume. DIAGNOSIS  - Snoring. RECOMMENDATIONS  - Avoid alcohol, sedatives and other CNS depressants that may worsen sleep apnea and disrupt normal sleep architecture.  - Sleep hygiene should be reviewed to assess factors that may improve sleep quality.  - Weight management and regular exercise should be initiated or continued if appropriate. [Electronically signed] 08/16/2018 01:58 PM Chesley Mires MD, [place], American Board of Sleep Medicine  NPI: [id number]

## 2018-08-16 NOTE — Telephone Encounter (Signed)
PSG 08/15/18 >> AHI 3.9, SpO2 low 87%   Please schedule ROV with me to discuss test results.

## 2018-08-18 ENCOUNTER — Ambulatory Visit (INDEPENDENT_AMBULATORY_CARE_PROVIDER_SITE_OTHER): Payer: Medicaid Other | Admitting: Psychiatry

## 2018-08-18 ENCOUNTER — Encounter (HOSPITAL_COMMUNITY): Payer: Self-pay | Admitting: Psychiatry

## 2018-08-18 DIAGNOSIS — G4700 Insomnia, unspecified: Secondary | ICD-10-CM

## 2018-08-18 DIAGNOSIS — F913 Oppositional defiant disorder: Secondary | ICD-10-CM

## 2018-08-18 DIAGNOSIS — F201 Disorganized schizophrenia: Secondary | ICD-10-CM

## 2018-08-18 MED ORDER — BENZTROPINE MESYLATE 1 MG PO TABS: 1.0000 mg | ORAL_TABLET | Freq: Two times a day (BID) | ORAL | 0 refills | Status: DC

## 2018-08-18 MED ORDER — OLANZAPINE 20 MG PO TABS: 20.0000 mg | ORAL_TABLET | Freq: Every day | ORAL | 0 refills | Status: DC

## 2018-08-18 NOTE — Telephone Encounter (Signed)
Called and spoke with patient she has been scheduled nothing further needed.  

## 2018-08-18 NOTE — Telephone Encounter (Signed)
Patient returned call, CB is [phone]

## 2018-08-18 NOTE — Progress Notes (Signed)
BH MD/PA/NP OP Progress Note  08/18/2018 9:44 AM Natalie Wagner  MRN:  [id number]  Chief Complaint:  Chief Complaint    Follow-up     HPI: Patient is here with her mother.  Mom states that she has been so busy she has not really been paying much attention to her daughter's symptoms.  Patient states that she is doing well.  She denies depression and irritability.  She denies any verbal or physical outbursts.  She states that she has been a little bit more active and goes out once or twice a week with her mother for a few hours.  After being out until noon she does some chores around the house.  She still spends a lot of time watching TV or napping.  She states that she is always tired and last week completed a sleep study in the clinic.  They are waiting on the results and hoping that Medicare will finally agreed to pay for her CPAP machine.  Patient is working on her diet.  She is using less oil, eating less fried foods and eating more vegetables.  They have met with a nutritional list who made some recommendations about food and quantity.  They want to start exercising together.  Patient is denying any symptoms of hallucinations and paranoia.  She denies SI/HI.  She reports she is taking her medications as prescribed.   Visit Diagnosis:    ICD-10-CM   1. 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 Asthma    prn inhaler  . Constipation 05/05/2013  . Difficulty swallowing pills   . Elevated prolactin level ([place]) 12/27/2014  . Galactorrhea 12/27/2014  . History of  seizures as a child    mother states were triggered by migraines; no seizures in > 7 yr.  . 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 Migraines   . Schizophrenia ([place])   . Sleep apnea     Past Surgical History:  Procedure Laterality Date  . COLONOSCOPY    . MRI     under anesthesia  . 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 UMBILICAL HERNIA REPAIR      Family Psychiatric History:  Family History  Problem Relation Age of Onset  . Diabetes Maternal Grandfather   . Hypertension Maternal Grandfather   . Heart disease Maternal Grandfather   . Kidney disease Maternal Grandfather   . Anesthesia problems Maternal Grandfather        hx. of being hard to wake up post-op  . Hypertension Maternal Grandmother   . Stroke Maternal Grandmother   . Asthma Mother   . Hypertension Maternal Uncle   . Depression Sister   . Cancer Other     Social History:  Social History   Socioeconomic History  . Marital status: Single    Spouse name: Not on file  . Number of  children: Not on file  . Years of education: Not on file  . Highest education level: Not on file  Occupational History  . Not on file  Social Needs  . Financial resource strain: Not on file  . Food insecurity:    Worry: Not on file    Inability: Not on file  . Transportation needs:    Medical: Not on file    Non-medical: Not on file  Tobacco Use  . Smoking status: Never Smoker  . Smokeless tobacco: Never Used  Substance and Sexual Activity  . Alcohol use: No    Alcohol/week: 0.0 standard drinks  . Drug use: No  . Sexual activity: Never    Birth control/protection: IUD  Lifestyle  . Physical activity:    Days per week: Not on file    Minutes per session: Not on file  . Stress: Not on file   Relationships  . Social connections:    Talks on phone: Not on file    Gets together: Not on file    Attends religious service: Not on file    Active member of club or organization: Not on file    Attends meetings of clubs or organizations: Not on file    Relationship status: Not on file  Other Topics Concern  . Not on file  Social History Narrative  . Not on file    Allergies:  Allergies  Allergen Reactions  . Lactose Intolerance (Gi) Diarrhea    Metabolic Disorder Labs: Lab Results  Component Value Date   HGBA1C 5.4 12/02/2017   MPG 105 03/02/2017   MPG 94 07/27/2016   Lab Results  Component Value Date   PROLACTIN 48.8 (H) 12/02/2017   PROLACTIN 34.0 (H) 11/21/2016   Lab Results  Component Value Date   CHOL 195 12/02/2017   TRIG 74 12/02/2017   HDL 34 (L) 12/02/2017   CHOLHDL 4.8 03/02/2017   VLDL 16 03/02/2017   LDLCALC 146 (H) 12/02/2017   LDLCALC 137 (H) 03/02/2017   Lab Results  Component Value Date   TSH 3.470 12/02/2017   TSH 2.67 03/02/2017    Therapeutic Level Labs: No results found for: LITHIUM No results found for: VALPROATE No components found for:  CBMZ  Current Medications: Current Outpatient Medications  Medication Sig Dispense Refill  . albuterol (PROAIR HFA) 108 (90 Base) MCG/ACT inhaler Inhale 2 puffs into the lungs every 4 (four) hours as needed for wheezing or shortness of breath. 1 Inhaler 1  . benztropine (COGENTIN) 1 MG tablet Take 1 tablet (1 mg total) by mouth 2 (two) times daily. 180 tablet 0  . fluticasone (FLONASE) 50 MCG/ACT nasal spray Place 2 sprays into both nostrils 2 (two) times daily. 16 g 11  . hyoscyamine (LEVSIN SL) 0.125 MG SL tablet Place 1 tablet (0.125 mg total) under the tongue every 4 (four) hours as needed. 180 tablet 5  . Melatonin 5 MG TABS Take 1 tablet by mouth daily as needed (sleep).     . OLANZapine (ZYPREXA) 20 MG tablet Take 1 tablet (20 mg total) by mouth at bedtime. 90 tablet 0  . Olopatadine HCl  (PAZEO) 0.7 % SOLN Place 1 mL into both eyes daily as needed (allergies).     . polyethylene glycol powder (GLYCOLAX/MIRALAX) powder Take 1/2 capful daily 255 g 3  . levonorgestrel (MIRENA) 20 MCG/24HR IUD 1 Intra Uterine Device (1 each total) by Intrauterine route once. 1 each 0  . topiramate (TOPAMAX) 25 MG tablet Take 1  tablet at night (Patient not taking: Reported on 07/06/2018) 90 tablet 3   No current facility-administered medications for this visit.      Musculoskeletal: Strength & Muscle Tone: within normal limits Gait & Station: normal Patient leans: straight  Psychiatric Specialty Exam: Review of Systems  Constitutional: Positive for malaise/fatigue. Negative for chills and fever.  Neurological: Positive for tingling. Negative for tremors and headaches.       Fingers on/off    Blood pressure 124/70, height _0  (1.473 m), weight 188 lb (85.3 kg).Body mass index is 39.29 kg/m.  General Appearance: Fairly Groomed  Eye Contact:  Good  Speech:  Clear and Coherent and Normal Rate  Volume:  Normal  Mood:  Euthymic  Affect:  Full Range  Thought Process:  Coherent, Linear and Descriptions of Associations: Intact  Orientation:  Full (Time, Place, and Person)  Thought Content:  Logical  Suicidal Thoughts:  No  Homicidal Thoughts:  No  Memory:  Immediate;   Good  Judgement:  Fair  Insight:  Shallow  Psychomotor Activity:  Normal  Concentration:  Concentration: Fair  Recall:  Good  Fund of Knowledge:  Good  Language:  Fair  Akathisia:  No  Handed:  Right  AIMS (if indicated):     Assets:  Desire for Improvement Housing Social Support  ADL's:  Intact  Cognition:  WNL  Sleep:   poor     Screenings: PHQ2-9     Nutrition from 08/02/2018 in [facility] Visit from 03/22/2017 in [facility]  PHQ-2 Total Score  0  0       Reviewed the information below on 08/18/2018 and have updated it Assessment and Plan:  Schizophrenia-undifferentiated type; ODD; insomnia      Medication management with supportive therapy. Risks/benefits and SE of the medication discussed. Pt verbalized understanding and verbal consent obtained for treatment.  Affirm with the patient that the medications are taken as ordered. Patient expressed understanding of how their medications were to be used.    The risk of un-intended pregnancy is low based on the fact that pt reports she has an IUD. Pt is aware that these meds carry a teratogenic risk. Pt will discuss plan of action if she does or plans to become pregnant in the future.   Current symptoms: stable. They are actively working on weight loss thru diet and have seen a nutrionalist   Meds: Zyprexa 20 mg p.o. nightly for schizophrenia Cogentin 1 mg p.o. twice daily to prevent EPS from antipsychotic use.      Labs: today I again reviewed labs with pt- 12/02/2017 CBC shows platelets at 428, CMP shows glucose 101, HbA1c 5.4, Lipid panel shows elevated LDL, TSH WNL, Prolactin level 48.8  We discussed the need for diet modification related to labs and weight gain   Therapy: brief supportive therapy provided. Discussed psychosocial stressors in detail.   At our last visit we reviewed sleep study consult with pt- she has mild sleep apnea. She went for a sleep study in the clinic and is waiting on the results. Medicare will not pay for the machine unless she did the study in the clinic    Consultations: none  Pt denies SI and is at an acute low risk for suicide. Patient told to call clinic if any problems occur. Patient advised to go to ER if they should develop SI/HI, side effects, or if symptoms worsen. Has crisis numbers to call if needed. Pt verbalized  understanding.   F/up in 3 months or sooner if needed   [name], MD 08/18/2018, 9:44 AM

## 2018-08-18 NOTE — Telephone Encounter (Signed)
Attempted to call patient today regarding results. I did not receive an answer at time of call. I have left a voicemail message for pt to return call. X1  

## 2018-08-30 ENCOUNTER — Ambulatory Visit (INDEPENDENT_AMBULATORY_CARE_PROVIDER_SITE_OTHER): Payer: Medicaid Other | Admitting: Pulmonary Disease

## 2018-08-30 ENCOUNTER — Encounter: Payer: Self-pay | Admitting: Registered"

## 2018-08-30 ENCOUNTER — Encounter: Payer: Medicaid Other | Attending: Pulmonary Disease | Admitting: Registered"

## 2018-08-30 ENCOUNTER — Encounter: Payer: Self-pay | Admitting: Pulmonary Disease

## 2018-08-30 VITALS — BP 106/76 | HR 86 | Ht <= 58 in | Wt 191.0 lb

## 2018-08-30 DIAGNOSIS — Z6835 Body mass index (BMI) 35.0-35.9, adult: Secondary | ICD-10-CM | POA: Diagnosis not present

## 2018-08-30 DIAGNOSIS — E669 Obesity, unspecified: Secondary | ICD-10-CM

## 2018-08-30 DIAGNOSIS — G4733 Obstructive sleep apnea (adult) (pediatric): Secondary | ICD-10-CM | POA: Diagnosis present

## 2018-08-30 DIAGNOSIS — Z713 Dietary counseling and surveillance: Secondary | ICD-10-CM | POA: Insufficient documentation

## 2018-08-30 DIAGNOSIS — R0683 Snoring: Secondary | ICD-10-CM | POA: Diagnosis not present

## 2018-08-30 NOTE — Progress Notes (Signed)
[facility]  Chief Complaint  Patient presents with  . Follow-up    Pt is here today for HST results. Pt is requesting flu shot today.    Constitutional:  BP 106/76 (BP Location: Left Arm, Cuff Size: Normal)   Pulse 86   Ht 4\' 10"  (1.473 m)   Wt 191 lb (86.6 kg)   SpO2 100%   BMI 39.92 kg/m   Past Medical History:  Asthma, Seizures, Migraine HA, Schizophrenia  Brief Summary:  Natalie Wagner is a 22 y.o. female with snoring.  She had in lab sleep study.  Did not show significant obstructive sleep apnea.  Physical Exam:   Appearance - well kempt  ENMT - clear nasal mucosa, midline nasal septum, no oral exudates, no LAN, trachea midline Respiratory - normal chest wall, normal respiratory effort, no accessory muscle use, no wheeze/rales, MP 4, enlarged tongue, elongated uvula CV - s1s2 regular rate and rhythm, no murmurs, no peripheral edema, radial pulses symmetric GI - soft, non tender, no masses Lymph - no adenopathy noted in neck and axillary areas MSK - normal muscle strength and tone, normal gait Ext - no cyanosis, clubbing, or joint inflammation noted Skin - no rashes, lesions, or ulcers Neuro - oriented to person, place, and time Psych - normal mood and affect  Discussion:  She has snoring and sleep disruption.  Home sleep study showed mild sleep apnea, but this was not accepted by her insurance.  In lab sleep study showed borderline sleep apnea, but her AHI was only 3.9.  As such she would not qualify for insurance to cover therapies.  Assessment/Plan:   Snoring. - discussed positional therapies - discussed importance of weight loss - if her symptoms progress, then might need repeat sleep testing - she can d/w her dentist about oral appliance for snoring; explained that this would likely not be covered by insurance   Patient Instructions  Flu shot today  Follow up in 1 year    Chesley Mires, MD [place] Pager: [phone] 08/30/2018, 11:43 AM  Flow Sheet    Sleep tests:  HST 01/12/18 >> AHI 8, SpO2 low 76% PSG 08/15/18 >> AHI 3.9, SpO2 low 87%  Medications:   Allergies as of 08/30/2018      Reactions   Lactose Intolerance (gi) Diarrhea      Medication List        Accurate as of 08/30/18 11:43 AM. Always use your most recent med list.          albuterol 108 (90 Base) MCG/ACT inhaler Commonly known as:  PROVENTIL HFA;VENTOLIN HFA Inhale 2 puffs into the lungs every 4 (four) hours as needed for wheezing or shortness of breath.   benztropine 1 MG tablet Commonly known as:  COGENTIN Take 1 tablet (1 mg total) by mouth 2 (two) times daily.   fluticasone 50 MCG/ACT nasal spray Commonly known as:  FLONASE Place 2 sprays into both nostrils 2 (two) times daily.   hyoscyamine 0.125 MG SL tablet Commonly known as:  LEVSIN SL Place 1 tablet (0.125 mg total) under the tongue every 4 (four) hours as needed.   levonorgestrel 20 MCG/24HR IUD Commonly known as:  MIRENA 1 Intra Uterine Device (1 each total) by Intrauterine route once.   Melatonin 5 MG Tabs Take 1 tablet by mouth daily as needed (sleep).   OLANZapine 20 MG tablet Commonly known as:  ZYPREXA Take 1 tablet (20 mg total) by mouth at  bedtime.   PAZEO 0.7 % Soln Generic drug:  Olopatadine HCl Place 1 mL into both eyes daily as needed (allergies).   polyethylene glycol powder powder Commonly known as:  GLYCOLAX/MIRALAX Take 1/2 capful daily   topiramate 25 MG tablet Commonly known as:  TOPAMAX Take 1 tablet at night       Past Surgical History:  She  has a past surgical history that includes Umbilical hernia repair; MRI; Colonoscopy; and Tendon repair (Left, 05/28/2014).  Family History:  Her family history includes Anesthesia problems in her maternal grandfather; Asthma in her mother; Cancer in her other; Depression in her sister; Diabetes in her maternal grandfather; Heart  disease in her maternal grandfather; Hypertension in her maternal grandfather, maternal grandmother, and maternal uncle; Kidney disease in her maternal grandfather; Stroke in her maternal grandmother.  Social History:  She  reports that she has never smoked. She has never used smokeless tobacco. She reports that she does not drink alcohol or use drugs.

## 2018-08-30 NOTE — Patient Instructions (Addendum)
-   Try plain soy milk or Fair Life milk with cereal.   - Aim for breakfast daily including at least carbohydrate and protein such as cereal + milk, grits + bacon, fruit + sausage, etc.   - Practice making grits.

## 2018-08-30 NOTE — Progress Notes (Signed)
Medical Nutrition Therapy:  Appt start time: 10:00 end time: 10:45.  Assessment:  Primary concerns today: Pt arrives with mom. Pt states she has not been able to work on previously stated goals. Pt states she has been craving chocolate and candy lately because its around Halloween. Pt states she has been thinking about healthier changes and trying to eat breakfast more often. Pt states when she thinks of breakfast, she automatically thinks of eggs and she is not a fan of eggs; eats them sometimes. Pt states she has does not eat breakfast daily because sometimes she is at her grandmothers house or she does not have someone to cook them for her. Pt states most dairy causes stomach discomfort except [place] cheese; makes grilled cheese with this sometimes.  Mom states she works a lot while pt is at home. Mom states they have been eating out for the last 5 months because pt's grandma became sick. Pt's mom states she has been unable to prepare food. Pt's mom is paying for 2 households and sometimes having issues providing enough food for family from month to month.   Pt expectations: meal planning/prepping ideas, get back on track with exercise   Pt states she enjoyed playing dodgeball and volleyball when in high school; now enjoys watching tv. Pt states she also likes to cook.   Pt states her mouth is typically dry in the morning after taking medications at night; drinks a lot of fluid. Pt states she helps take care of grandma in the mornings at times.   Pt states soy milk and lactose-free milk cause stomach issues when she drinks a lot of it. Pt states she loves vegetables: cauliflower, spinach, carrots.   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    Preferred Learning Style:   No preference indicated   Learning Readiness:   Contemplating  Ready  Change in progress   MEDICATIONS: See list   DIETARY INTAKE:  Usual eating pattern includes 2  meals and 3-4 snacks per day.  Everyday foods include convenience foods, cookies, candy, fast food, juice, and water.  Avoided foods include milk.    24-hr recall:  B ( AM): sometimes skips; fast food-bacon biscuits + grits or sausage + boiled eggs or sausage Snk ( AM): none  L ( PM): sandwich + fruit + candy Snk ( PM): candy, cookies, and fruit sometimes D ( PM): sometimes skips; boiled chicken + rice + hot pocket + tv meal  Snk ( PM): none Beverages: water, cranberry/orange/apple juice, soda  Usual physical activity: none stated  Estimated energy needs: 2000 calories 225 g carbohydrates 150 g protein 56 g fat  Progress Towards Goal(s):  In progress.   Nutritional Diagnosis:  NI-5.8.5 Inadeqate fiber intake As related to less than optimal food-preparation practices.  As evidenced by pt report of reliance on convenience, overprocessed foods.    Intervention:  Nutrition education and counseling. Pt was educated and counseled on eating balanced meals and learning to prepare breakfast items that she enjoys. Pt was in agreement with goals listed.  Goals: - Try plain soy milk or Fair Life milk with cereal.  - Aim for breakfast daily including at least carbohydrate and protein such as cereal + milk, grits + bacon, fruit + sausage, etc.  - Practice making grits.   Teaching Method Utilized:  Visual Auditory Hands on  Handouts given during visit include:  Meal Ideas sheet  Barriers to learning/adherence to lifestyle change: contemplative stage  of change  Demonstrated degree of understanding via:  Teach Back   Monitoring/Evaluation:  Dietary intake, exercise, and body weight in 1 month(s).

## 2018-08-30 NOTE — Patient Instructions (Signed)
Flu shot today Follow up in 1 year 

## 2018-09-05 ENCOUNTER — Encounter (HOSPITAL_COMMUNITY): Payer: Self-pay | Admitting: Licensed Clinical Social Worker

## 2018-09-05 ENCOUNTER — Ambulatory Visit (INDEPENDENT_AMBULATORY_CARE_PROVIDER_SITE_OTHER): Payer: Medicaid Other | Admitting: Licensed Clinical Social Worker

## 2018-09-05 DIAGNOSIS — F201 Disorganized schizophrenia: Secondary | ICD-10-CM | POA: Diagnosis not present

## 2018-09-05 NOTE — Progress Notes (Signed)
Comprehensive Clinical Assessment (CCA) Note  09/05/2018 Natalie Wagner [id number]  Visit Diagnosis:      ICD-10-CM   1. 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  (See scanned document in Chart Review)  CCA Part Two A  Intake/Chief Complaint:  CCA Intake With Chief Complaint CCA Part Two Date: 09/05/18 CCA Part Two Time: 0818 Chief Complaint/Presenting Problem: eating habits have gotten bad, health scares, concerns about diabetes, needs CPAP. Feeling depressed.  Patients Currently Reported Symptoms/Problems: Grandma has cancer, very stressful, helps care for her, helps mom. Lives with Mom. Some depression, anxiety, hard to control it. Hard to get motivated for daily exercise Collateral Involvement: Mom. goes to a gastroenterologist, nutritionist.  Individual's Strengths: caring, tries to be helpful Individual's Preferences: eating, dairy, but not supposed to eat it. Coloring, cooking, tv, play video games Individual's Abilities: some sports, playing video games, try new foods Type of Services Patient Feels Are Needed: OPT, medication management with Dr. [name] Initial Clinical Notes/Concerns: Schizophrenia, has PCOS  Mental Health Symptoms Depression:  Depression: Change in energy/activity, Difficulty Concentrating, Fatigue, Hopelessness, Worthlessness, Weight gain/loss, Increase/decrease in appetite, Irritability, Sleep (too much or little)  Mania:  Mania: N/A  Anxiety:   Anxiety: Worrying, Tension, Sleep, Restlessness, Irritability, Fatigue, Difficulty concentrating  Psychosis:  Psychosis: Other negative symptoms(laughs inappropriately)  Trauma:  Trauma: N/A  Obsessions:  Obsessions: N/A  Compulsions:  Compulsions: N/A  Inattention:  Inattention: (hx of ADHD treatment as a child)  Hyperactivity/Impulsivity:  Hyperactivity/Impulsivity: N/A  Oppositional/Defiant Behaviors:  Oppositional/Defiant  Behaviors: Argumentative, Temper  Borderline Personality:  Emotional Irregularity: Mood lability  Other Mood/Personality Symptoms:  Other Mood/Personality Symtpoms: Many sxs of schizophrenia are under control with Zyprexa, but increased hair growth and weight gain.   Mental Status Exam Appearance and self-care  Stature:  Stature: Small  Weight:  Weight: Overweight  Clothing:  Clothing: Casual  Grooming:  Grooming: Normal  Cosmetic use:  Cosmetic Use: Age appropriate  Posture/gait:  Posture/Gait: Normal  Motor activity:  Motor Activity: Restless  Sensorium  Attention:  Attention: Normal  Concentration:  Concentration: Anxiety interferes  Orientation:  Orientation: X5  Recall/memory:  Recall/Memory: Defective in Recent  Affect and Mood  Affect:  Affect: Appropriate  Mood:  Mood: Euthymic  Relating  Eye contact:  Eye Contact: Normal  Facial expression:  Facial Expression: Responsive  Attitude toward examiner:  Attitude Toward Examiner: Cooperative  Thought and Language  Speech flow: Speech Flow: Normal  Thought content:  Thought Content: Appropriate to mood and circumstances  Preoccupation:   NA  Hallucinations:   NA  Organization:   scattered  Transport planner of Knowledge:  Fund of Knowledge: Average  Intelligence:  Intelligence: Average  Abstraction:  Abstraction: Normal  Judgement:  Judgement: Fair  Art therapist:  Reality Testing: Variable  Insight:  Insight: Fair  Decision Making:  Decision Making: Vacilates  Social Functioning  Social Maturity:  Social Maturity: Isolates  Social Judgement:  Social Judgement: Naive  Stress  Stressors:  Stressors: Grief/losses, Illness  Coping Ability:  Coping Ability: English as a second language teacher Deficits:   NA  Supports:   mother   Family and Psychosocial History: Family history Marital status: Single Are you sexually active?: No What is your sexual orientation?: heterosexual Has your sexual activity been affected by drugs,  alcohol, medication, or emotional stress?: N/A Does patient have children?: No  Childhood History:  Childhood History By  whom was/is the patient raised?: Mother Additional childhood history information: ADHD made sure I didn't have a lot of friends, tried to stay near the teachers. Kids didn't like me, I was weird.  Description of patient's relationship with caregiver when they were a child: okay relationship with mom Patient's description of current relationship with people who raised him/her: Okay now, lives with Mom How were you disciplined when you got in trouble as a child/adolescent?: time out Does patient have siblings?: Yes Number of Siblings: 4 Description of patient's current relationship with siblings: 2 brothers, 2 sisters- I'm middle child (78rd of 5). Terrible relationship with siblings, they have their own issues and don't want to hear about me. Brothers in the home.  Did patient suffer any verbal/emotional/physical/sexual abuse as a child?: No Did patient suffer from severe childhood neglect?: No Has patient ever been sexually abused/assaulted/raped as an adolescent or adult?: No Was the patient ever a victim of a crime or a disaster?: No Witnessed domestic violence?: No Has patient been effected by domestic violence as an adult?: No  CCA Part Two B  Employment/Work Situation: Employment / Work Copywriter, advertising Employment situation: On disability Why is patient on disability: Schizophrenia and learning disability How long has patient been on disability: since childhood Patient's job has been impacted by current illness: No What is the longest time patient has a held a job?: n/a Did You Receive Any Psychiatric Treatment/Services While in the [employer]?: No Are There Guns or Other Weapons in Freedom?: Yes Types of Guns/Weapons: Patent attorney  Education: Education Name of [place]: [employer] Did Teacher, adult education From [employer]?: Yes Did Scientific laboratory technician?: No Did Heritage manager?: No Did You Have Any Special Interests In School?: art, PE Did You Have An Individualized Education Program (IIEP): Yes Did You Have Any Difficulty At School?: Yes Were Any Medications Ever Prescribed For These Difficulties?: Yes Medications Prescribed For School Difficulties?: ADHD medications  Religion: Religion/Spirituality Are You A Religious Person?: No  Leisure/Recreation: Leisure / Recreation Leisure and Hobbies: coloring, watch tv, play video games  Exercise/Diet: Exercise/Diet Do You Exercise?: No Have You Gained or Lost A Significant Amount of Weight in the Past Six Months?: Yes-Gained Do You Follow a Special Diet?: Yes(no dairy, should eat more healthy foods) Do You Have Any Trouble Sleeping?: Yes Explanation of Sleeping Difficulties: tosses and turns, hard to get comfortable, snores, but not in need of CPAP  CCA Part Two C  Alcohol/Drug Use: Alcohol / Drug Use Pain Medications: SEE MAR Prescriptions: SEE MAR Over the Counter: SEE MAR History of alcohol / drug use?: No history of alcohol / drug abuse                      CCA Part Three  ASAM's:  Six Dimensions of Multidimensional Assessment  Dimension 1:  Acute Intoxication and/or Withdrawal Potential:     Dimension 2:  Biomedical Conditions and Complications:     Dimension 3:  Emotional, Behavioral, or Cognitive Conditions and Complications:     Dimension 4:  Readiness to Change:     Dimension 5:  Relapse, Continued use, or Continued Problem Potential:     Dimension 6:  Recovery/Living Environment:      Substance use Disorder (SUD)    Social Function:  Social Functioning Social Maturity: Isolates Social Judgement: Naive  Stress:  Stress Stressors: Grief/losses, Illness Coping Ability: Overwhelmed Patient Takes Medications The Way The Doctor Instructed?: Yes Priority  Risk: Low Acuity  Risk Assessment- Self-Harm Potential: Risk Assessment For  Self-Harm Potential Thoughts of Self-Harm: No current thoughts Method: No plan Availability of Means: No access/NA Additional Information for Self-Harm Potential: Previous Attempts Additional Comments for Self-Harm Potential: has been hospitalized at [facility] and [facility]  Risk Assessment -Dangerous to Others Potential: Risk Assessment For Dangerous to Others Potential Method: No Plan Availability of Means: No access or NA Intent: Vague intent or NA Notification Required: No need or identified person  DSM5 Diagnoses: Patient Active Problem List   Diagnosis Date Noted  . Sleep apnea 02/10/2018  . Nipple discharge 03/22/2017  . Vaginal discharge 03/22/2017  . Vaginal bleeding 03/09/2017  . Obesity 03/02/2017  . Mild intermittent asthma, uncomplicated [id number]  . Tension-type headache, not intractable 10/21/2016  . Eating disorder 07/27/2016  . Simple cyst of kidney 01/24/2016  . Convulsions ([place]) 09/03/2015  . 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 Acanthosis 05/20/2015  . CN (constipation) 12/27/2014  . Therapeutic drug monitoring 06/03/2014  . Hyperlipidemia LDL goal <130 05/23/2014  . Lactose intolerance documented with breath testing 10/25/2013  . Irritable bowel syndrome 05/23/2013  . 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 Acne vulgaris 05/05/2013  . Allergic rhinitis 05/05/2013  . PCOS (polycystic ovarian syndrome) 05/05/2013    Patient Centered Plan: Patient is on the following Treatment Plan(s):  Anxiety and Depression  Recommendations for Services/Supports/Treatments: Recommendations for Services/Supports/Treatments Recommendations For Services/Supports/Treatments: Individual Therapy, Medication Management  Treatment Plan Summary: OP Treatment Plan Summary: "I want to work on my anxiety, depression, self-confidence, and my ability to socialize"  Referrals to Alternative Service(s): Referred to Alternative Service(s):   Place:   Date:   Time:    Referred to  Alternative Service(s):   Place:   Date:   Time:    Referred to Alternative Service(s):   Place:   Date:   Time:    Referred to Alternative Service(s):   Place:   Date:   Time:     [name], LCSW

## 2018-09-06 ENCOUNTER — Ambulatory Visit: Payer: Self-pay | Admitting: Pulmonary Disease

## 2018-09-30 ENCOUNTER — Encounter: Payer: Self-pay | Admitting: Registered"

## 2018-09-30 ENCOUNTER — Encounter: Payer: Medicaid Other | Attending: Pulmonary Disease | Admitting: Registered"

## 2018-09-30 DIAGNOSIS — Z6835 Body mass index (BMI) 35.0-35.9, adult: Secondary | ICD-10-CM | POA: Diagnosis not present

## 2018-09-30 DIAGNOSIS — E669 Obesity, unspecified: Secondary | ICD-10-CM

## 2018-09-30 DIAGNOSIS — Z713 Dietary counseling and surveillance: Secondary | ICD-10-CM | POA: Insufficient documentation

## 2018-09-30 DIAGNOSIS — G4733 Obstructive sleep apnea (adult) (pediatric): Secondary | ICD-10-CM | POA: Insufficient documentation

## 2018-09-30 NOTE — Progress Notes (Signed)
Medical Nutrition Therapy:  Appt start time: 8:05 end time: 8:36.  Assessment:  Primary concerns today: Pt arrives with mom.  Pt states she is helping her sister cook for Thanksgiving. Pt states she has been eating fried chicken a lot lately and drinking sweet tea. Pt states she cooked baked fish and rice for dinner; turned out well and planning to do it again with more seasonings and spices. Pt states she tried making grits once and they did not turn out well, too lumpy but willing to try it again. Pt states she handles lactose-free milk well but does not consume too much of it. Pt states she used to drink orange juice but it would cause chest pain when drinking at night. Pt has been working on some goals from previous visit and cooking more often.   Pt's mom states she eats at [employer] daily.    Pt states when she thinks of breakfast, she automatically thinks of eggs and she is not a fan of eggs; eats them sometimes. Pt states most dairy causes stomach discomfort except Vermont cheese; makes grilled cheese with this sometimes.  Mom states she works a lot while pt is at home. Mom states they have been eating out for the last 5 months because pt's grandma became sick. Pt's mom states she has been unable to prepare food. Pt's mom is paying for 2 households and sometimes having issues providing enough food for family from month to month.   Pt expectations: meal planning/prepping ideas, get back on track with exercise   Pt states she enjoyed playing dodgeball and volleyball when in high school; now enjoys watching tv. Pt states she also likes to cook.   Pt states her mouth is typically dry in the morning after taking medications at night; drinks a lot of fluid. Pt states she helps take care of grandma in the mornings at times.   Pt states soy milk and lactose-free milk cause stomach issues when she drinks a lot of it. Pt states she loves vegetables: cauliflower, spinach, carrots.   Pt reports  history of IBS; triggers include orange juice, milk, and sometimes candy.    Preferred Learning Style:   No preference indicated   Learning Readiness:   Contemplating  Ready  Change in progress   MEDICATIONS: See list   DIETARY INTAKE:  Usual eating pattern includes 2 meals and 3-4 snacks per day.  Everyday foods include convenience foods, cookies, candy, fast food, juice, and water.  Avoided foods include milk.    24-hr recall:  B ( AM):tried making grits  Snk ( AM): none  L ( PM): fried chicken + potatoes/french fries + sweet tea Snk ( PM): candy, cookies, and fruit sometimes D ( PM): baked fish + rice   Snk ( PM): none Beverages: water, cranberry/orange, soda, ginger ale, sweet tea  Usual physical activity: none stated  Estimated energy needs: 2000 calories 225 g carbohydrates 150 g protein 56 g fat  Progress Towards Goal(s):  In progress.   Nutritional Diagnosis:  NI-5.8.5 Inadeqate fiber intake As related to less than optimal food-preparation practices.  As evidenced by pt report of reliance on convenience, overprocessed foods.    Intervention:  Nutrition education and counseling. Pt was educated and counseled on eating balanced meals and learning to prepare breakfast items that she enjoys. Pt was in agreement with goals listed.  Goals: - Try plain soy milk or Fair Life milk with cereal.  - Aim for breakfast daily including at least  carbohydrate and protein such as cereal + milk, grits + bacon, fruit + sausage, etc.  - Practice making grits.   Teaching Method Utilized:  Visual Auditory Hands on  Handouts given during visit include:  Meal Ideas sheet  Barriers to learning/adherence to lifestyle change: contemplative stage of change  Demonstrated degree of understanding via:  Teach Back   Monitoring/Evaluation:  Dietary intake, exercise, and body weight in 1 month(s).

## 2018-09-30 NOTE — Patient Instructions (Addendum)
-   Add vegetables to lunch and dinner such as broccoli, carrots, potatoes, cauliflower.   - Continue to try making grits for breakfast.

## 2018-10-20 ENCOUNTER — Ambulatory Visit (INDEPENDENT_AMBULATORY_CARE_PROVIDER_SITE_OTHER): Payer: Medicaid Other | Admitting: Licensed Clinical Social Worker

## 2018-10-20 ENCOUNTER — Encounter (HOSPITAL_COMMUNITY): Payer: Self-pay | Admitting: Licensed Clinical Social Worker

## 2018-10-20 DIAGNOSIS — F201 Disorganized schizophrenia: Secondary | ICD-10-CM | POA: Diagnosis not present

## 2018-10-20 NOTE — Progress Notes (Signed)
   THERAPIST PROGRESS NOTE  Session Time: 11:00am-11:55am  Participation Level: Active  Behavioral Response: CasualAlertAnxious  Type of Therapy: Individual Therapy  Treatment Goals addressed: Improve Psychiatric Symptoms, regulate mood, improve unhelpful thought patterns, social skills (socialize more), stress management, reduce hallucinations and delusions, learn about diagnosis, healthy coping skills  Interventions: Motivational Interviewing, CBT, Grounding & Mindfulness Techniques, psychoeducation  Summary: Natalie Wagner is a 67.yo. female who presents with Schizophrenia, disorganized type     Suicidal/Homicidal: No -without intent/plan  Therapist Response: Islam met with clinician for an individual session. [name] discussed her psychiatric symptoms, her current life events and her homework. [name] shared increased anxiety lately about her eating, breathing, etc. She reports she is interested in completing worksheets for accountability and coping skills. Clinician provided pack of anxiety worksheets from [employer]. Clinician explained the use of breathing skills, identifying anxiety sxs, gratitude meditation, and provided a packet of information about grounding techniques. [name] also identified concerns about sleep. Clinician provided a list of sleep hygiene rules.   Plan: Return again in 1- 2 weeks  Diagnosis:     Axis I: Schizophrenia, disorganized type    [name], LCSW 10/20/2018

## 2018-10-26 ENCOUNTER — Encounter: Payer: Medicaid Other | Attending: Pulmonary Disease | Admitting: Registered"

## 2018-10-26 ENCOUNTER — Encounter: Payer: Self-pay | Admitting: Registered"

## 2018-10-26 DIAGNOSIS — Z6835 Body mass index (BMI) 35.0-35.9, adult: Secondary | ICD-10-CM | POA: Diagnosis not present

## 2018-10-26 DIAGNOSIS — Z713 Dietary counseling and surveillance: Secondary | ICD-10-CM | POA: Diagnosis not present

## 2018-10-26 DIAGNOSIS — E669 Obesity, unspecified: Secondary | ICD-10-CM

## 2018-10-26 DIAGNOSIS — G4733 Obstructive sleep apnea (adult) (pediatric): Secondary | ICD-10-CM | POA: Insufficient documentation

## 2018-10-26 NOTE — Progress Notes (Signed)
  Medical Nutrition Therapy:  Appt start time: 9:30 end time: 10:12.  Assessment:  Primary concerns today: Pt arrives with mom.  Pt expectations: meal planning/prepping ideas, get back on track with exercise   Pt states she has been having anxiety and depression lately due to grandmother being sick and helping take care of her. Pt states she saw therapist on 12/12; sees therapist every 2 weeks. Pt states medications causes her sleeping and appetite to fluctuate between being sleepy/wide awake and hungry/not hungry.   Pt reports recent IBS flare-ups. Pt states she is working on eating more vegetables and fruits. Pt states she has not tried cooking grits again. Pt states she has been intentional with drinking more water lately, reducing soda intake.     Pt states when she thinks of breakfast, she automatically thinks of eggs and she is not a fan of eggs; eats them sometimes. Pt states most dairy causes stomach discomfort except [place] cheese; makes grilled cheese with this sometimes. 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    Preferred Learning Style:   No preference indicated   Learning Readiness:   Contemplating  Ready  Change in progress   MEDICATIONS: See list   DIETARY INTAKE:  Usual eating pattern includes 2 meals and 3-4 snacks per day.  Everyday foods include convenience foods, cookies, candy, fast food, juice, and water.  Avoided foods include milk.    24-hr recall:  B ( AM): banana Snk ( AM): slim jim L ( PM): Mrs[employer]inner's-chicken tenders + green beans + biscuit Snk ( PM): candy, cookies, and fruit sometimes D ( PM): jumbalaya (rice + sausage) Snk ( PM): none Beverages: water (4 bottles, 64 oz), sweet tea  Usual physical activity: none stated  Estimated energy needs: 2000 calories 225 g carbohydrates 150 g protein 56 g fat  Progress Towards Goal(s):  In progress.   Nutritional Diagnosis:  NI-5.8.5 Inadeqate  fiber intake As related to less than optimal food-preparation practices.  As evidenced by pt report of reliance on convenience, overprocessed foods.    Intervention:  Nutrition education and counseling. Pt was educated and counseled on eating balanced meals, the purpose of increasing fiber intake, and ways to increase fiber intake with meals. Pt was encouraged to keep up the great work with being intentional about having vegetables with lunch and increasing water intake. Pt was in agreement with goals listed.  Goals: - Aim to increase fiber intake by having vegetables with lunch and dinner.  - Lunch options can include: salad + protein or protein + vegetables + carbohydrates  - Prebiotics vs probiotics  Prebiotics are types of dietary fiber that feed the friendly bacteria in your gut.   Probiotics are living microorganisms that, when ingested, provide numerous health benefits. They are often taken as supplements that are supposed to colonize your gut with health-boosting microorganisms  Teaching Method Utilized:  Visual Auditory Hands on  Handouts given during visit include:  none  Barriers to learning/adherence to lifestyle change: contemplative stage of change  Demonstrated degree of understanding via:  Teach Back   Monitoring/Evaluation:  Dietary intake, exercise, and body weight in 1 month(s).

## 2018-10-26 NOTE — Patient Instructions (Addendum)
-   Aim to increase fiber intake by having vegetables with lunch and dinner.   - Lunch options can include: salad + protein or protein + vegetables + carbohydrates   - Prebiotics vs probiotics  Prebiotics are types of dietary fiber that feed the friendly bacteria in your gut.   Probiotics are living microorganisms that, when ingested, provide numerous health benefits. They are often taken as supplements that are supposed to colonize your gut with health-boosting microorganisms

## 2018-11-08 ENCOUNTER — Ambulatory Visit (INDEPENDENT_AMBULATORY_CARE_PROVIDER_SITE_OTHER): Payer: Medicaid Other | Admitting: Licensed Clinical Social Worker

## 2018-11-08 ENCOUNTER — Encounter (HOSPITAL_COMMUNITY): Payer: Self-pay | Admitting: Licensed Clinical Social Worker

## 2018-11-08 DIAGNOSIS — F201 Disorganized schizophrenia: Secondary | ICD-10-CM

## 2018-11-08 NOTE — Progress Notes (Signed)
   THERAPIST PROGRESS NOTE  Session Time: 11:00am-11:45am  Participation Level: Active  Behavioral Response: NeatAlertEuthymic  Type of Therapy: Individual Therapy  Treatment Goals addressed: "I want to work on my anxiety, depression, self-confidence, and my ability to socialize"  Interventions: Motivational Interviewing, CBT, Grounding & Mindfulness Techniques, psychoeducation  Summary: Natalie Wagner is a 22 yo. female who presents with Schizophrenia, disorganized type     Suicidal/Homicidal: No -without intent/plan  Therapist Response: Aleka met with clinician for an individual session. Jasmen discussed her psychiatric symptoms, her current life events and her homework. Laney shared that she has been working on Medical laboratory scientific officer, such as deep breathing, but she had not completed any worksheets. Clinician reviewed worksheets and identified the usefulness of engaging in coping skills practice. Clinician discussed other activities that can be helpful for managing stress. Clinician provided coloring sheets and word search puzzles.   Plan: Return again in 3-4 weeks  Diagnosis:     Axis I: Schizophrenia, disorganized type    [name], LCSW 11/08/2018

## 2018-11-17 ENCOUNTER — Encounter (HOSPITAL_COMMUNITY): Payer: Self-pay | Admitting: Psychiatry

## 2018-11-17 ENCOUNTER — Ambulatory Visit (INDEPENDENT_AMBULATORY_CARE_PROVIDER_SITE_OTHER): Payer: Medicaid Other | Admitting: Psychiatry

## 2018-11-17 VITALS — BP 126/74 | Ht 59.0 in | Wt 185.0 lb

## 2018-11-17 DIAGNOSIS — F411 Generalized anxiety disorder: Secondary | ICD-10-CM

## 2018-11-17 DIAGNOSIS — G4700 Insomnia, unspecified: Secondary | ICD-10-CM | POA: Diagnosis not present

## 2018-11-17 DIAGNOSIS — F201 Disorganized schizophrenia: Secondary | ICD-10-CM

## 2018-11-17 MED ORDER — BENZTROPINE MESYLATE 1 MG PO TABS: 1.0000 mg | ORAL_TABLET | Freq: Two times a day (BID) | ORAL | 0 refills | Status: DC

## 2018-11-17 MED ORDER — OLANZAPINE 20 MG PO TABS: 20.0000 mg | ORAL_TABLET | Freq: Every day | ORAL | 0 refills | Status: DC

## 2018-11-17 NOTE — Progress Notes (Signed)
[place] MD/PA/NP OP Progress Note  11/17/2018 10:00 AM Natalie Wagner  MRN:  [id number]  Chief Complaint:  Chief Complaint    Anxiety; Insomnia; Follow-up     HPI: Patient is here with her mother.  Mother did not share or contribute  to today's interview  Patient tells me that she is working with a nutritional list for weight loss.  She is having a lot of trouble controlling her snacking and portion size.  She tells me that she has been eating a lot of unhealthy foods.  She has increased her water intake but is also drinking orange juice and cranberry juice.  She states that she has flareups of IBS and is not sure of the trigger.  Most of the time she is constipated but several times months she has diarrhea.  Her main concern at this point in time is her anxiety related to her grandmother who is sick.  She states she spends a lot of time thinking about her and what she can do to help.  This sometimes interferes with her sleep.  Her sleep overall remains poor.  Patient did get the results of her sleep study.  She states that she was told she has very mild sleep apnea but does not need a CPAP.  She would benefit from weight loss.  Even during the day her naps are not restful.  She is denying any symptoms of depression including isolation, crying and anhedonia.  She denies SI/HI.  She states that her schizophrenia is under control and is denying any symptoms of hallucinations, ideas of reference, paranoia or magical thinking.     Visit Diagnosis:    ICD-10-CM   1. Anxiety state F41.1   2. Schizophrenia, disorganized type ([place]) F20.1 benztropine (COGENTIN) 1 MG tablet    OLANZapine (ZYPREXA) 20 MG tablet  3. Insomnia, unspecified type G47.00        Past Psychiatric History:  Anxiety: No Bipolar Disorder: No Depression: No Mania: No Psychosis: Yes Schizophrenia: Yes Personality Disorder: No Hospitalization for psychiatric illness: Yes History of Electroconvulsive Shock Therapy: No Prior  Suicide Attempts: Yes   Past Medical History:  Past Medical History:  Diagnosis Date  . Asthma    prn inhaler  . Constipation 05/05/2013  . Difficulty swallowing pills   . Elevated prolactin level ([place]) 12/27/2014  . Galactorrhea 12/27/2014  . History of seizures as a child    mother states were triggered by migraines; no seizures in > 7 yr.  . 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 Migraines   . Schizophrenia ([place])   . Sleep apnea     Past Surgical History:  Procedure Laterality Date  . COLONOSCOPY    . MRI     under anesthesia  . 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 UMBILICAL HERNIA REPAIR      Family Psychiatric History:  Family History  Problem Relation Age of Onset  . Diabetes Maternal Grandfather   . Hypertension Maternal Grandfather   . Heart disease Maternal Grandfather   . Kidney disease Maternal Grandfather   . Anesthesia problems Maternal Grandfather        hx. of being hard to wake up post-op  . Hypertension Maternal Grandmother   .  Stroke Maternal Grandmother   . Asthma Mother   . Hypertension Maternal Uncle   . Depression Sister   . Cancer Other     Social History:  Social History   Socioeconomic History  . Marital status: Single    Spouse name: Not on file  . Number of children: Not on file  . Years of education: Not on file  . Highest education level: Not on file  Occupational History  . Not on file  Social Needs  . Financial resource strain: Not on file  . Food insecurity:    Worry: Not on file    Inability: Not on file  . Transportation needs:    Medical: Not on file    Non-medical: Not on file  Tobacco Use  . Smoking status: Never Smoker  . Smokeless tobacco: Never Used  Substance and Sexual  Activity  . Alcohol use: No    Alcohol/week: 0.0 standard drinks  . Drug use: No  . Sexual activity: Never    Birth control/protection: I.U.D.  Lifestyle  . Physical activity:    Days per week: Not on file    Minutes per session: Not on file  . Stress: Not on file  Relationships  . Social connections:    Talks on phone: Not on file    Gets together: Not on file    Attends religious service: Not on file    Active member of club or organization: Not on file    Attends meetings of clubs or organizations: Not on file    Relationship status: Not on file  Other Topics Concern  . Not on file  Social History Narrative  . Not on file    Allergies:  Allergies  Allergen Reactions  . Lactose Intolerance (Gi) Diarrhea    Metabolic Disorder Labs: Lab Results  Component Value Date   HGBA1C 5.4 12/02/2017   MPG 105 03/02/2017   MPG 94 07/27/2016   Lab Results  Component Value Date   PROLACTIN 48.8 (H) 12/02/2017   PROLACTIN 34.0 (H) 11/21/2016   Lab Results  Component Value Date   CHOL 195 12/02/2017   TRIG 74 12/02/2017   HDL 34 (L) 12/02/2017   CHOLHDL 4.8 03/02/2017   VLDL 16 03/02/2017   LDLCALC 146 (H) 12/02/2017   LDLCALC 137 (H) 03/02/2017   Lab Results  Component Value Date   TSH 3.470 12/02/2017   TSH 2.67 03/02/2017    Therapeutic Level Labs: No results found for: LITHIUM No results found for: VALPROATE No components found for:  CBMZ  Current Medications: Current Outpatient Medications  Medication Sig Dispense Refill  . albuterol (PROAIR HFA) 108 (90 Base) MCG/ACT inhaler Inhale 2 puffs into the lungs every 4 (four) hours as needed for wheezing or shortness of breath. 1 Inhaler 1  . benztropine (COGENTIN) 1 MG tablet Take 1 tablet (1 mg total) by mouth 2 (two) times daily. 180 tablet 0  . fluticasone (FLONASE) 50 MCG/ACT nasal spray Place 2 sprays into both nostrils 2 (two) times daily. 16 g 11  . hyoscyamine (LEVSIN SL) 0.125 MG SL tablet Place 1  tablet (0.125 mg total) under the tongue every 4 (four) hours as needed. 180 tablet 5  . Melatonin 5 MG TABS Take 1 tablet by mouth daily as needed (sleep).     . OLANZapine (ZYPREXA) 20 MG tablet Take 1 tablet (20 mg total) by mouth at bedtime. 90 tablet 0  . Olopatadine HCl (PAZEO) 0.7 % SOLN Place 1 mL  into both eyes daily as needed (allergies).     . polyethylene glycol powder (GLYCOLAX/MIRALAX) powder Take 1/2 capful daily 255 g 3  . RESTASIS 0.05 % ophthalmic emulsion 1 DROP TWICE A DAY INTO BOTH EYES  3  . levonorgestrel (MIRENA) 20 MCG/24HR IUD 1 Intra Uterine Device (1 each total) by Intrauterine route once. 1 each 0  . topiramate (TOPAMAX) 25 MG tablet Take 1 tablet at night (Patient not taking: Reported on 09/05/2018) 90 tablet 3   No current facility-administered medications for this visit.      Musculoskeletal: Strength & Muscle Tone: within normal limits Gait & Station: normal Patient leans: straight  Psychiatric Specialty Exam: Review of Systems  Constitutional: Negative for chills, diaphoresis and fever.  Respiratory: Negative for cough, shortness of breath and wheezing.   Gastrointestinal: Positive for constipation and diarrhea. Negative for abdominal pain.    Blood pressure 126/74, height 4\' 11"  (1.499 m), weight 185 lb (83.9 kg).Body mass index is 37.37 kg/m.  General Appearance: Fairly Groomed  Eye Contact:  Good  Speech:  Clear and Coherent and Normal Rate  Volume:  Normal  Mood:  Euthymic  Affect:  Blunt  Thought Process:  Coherent, Linear, concrete and Descriptions of Associations: Intact  Orientation:  Full (Time, Place, and Person)  Thought Content:  Rumination  Suicidal Thoughts:  No  Homicidal Thoughts:  No  Memory:  Immediate;   Good  Judgement:  Poor  Insight:  Shallow  Psychomotor Activity:  Normal  Concentration:  Concentration: Fair  Recall:  [employer] of Knowledge:  Fair  Language:  Fair  Akathisia:  No  Handed:  Right  AIMS (if  indicated):     Assets:  Desire for Improvement Housing Social Support Transportation  ADL's:  Intact  Cognition:  WNL  Sleep:   poor       Screenings: PHQ2-9     N[facility] Nutrition [facility] 09/30/2018 [facility] Nutrition from 08/30/2018 in [facility] Nutrition from 08/02/2018 in [facility] Office Visit from 03/22/2017 in [facility]  PHQ-2 Total Score  2  0  0  0  0  PHQ-9 Total Score  8  -  -  -  -       I reviewed the information below on 11/17/2018 and have updated it Assessment and Plan: Schizophrenia-undifferentiated type; ODD; insomnia; r/o eating disorder      Medication management with supportive therapy. Risks/benefits and SE of the medication discussed. Pt verbalized understanding and verbal consent obtained for treatment.  Affirm with the patient that the medications are taken as ordered. Patient expressed understanding of how their medications were to be used.    The risk of un-intended pregnancy is low based on the fact that pt reports she has an IUD. Pt is aware that these meds carry a teratogenic risk. Pt will discuss plan of action if she does or plans to become pregnant in the future.   Current symptoms: poor sleep, ongoing anxiety, denying symptoms of psychosis   Meds: Zyprexa 20 mg p.o. nightly for schizophrenia Cogentin 1 mg p.o. twice daily to prevent EPS from antipsychotic use.      Labs: none today    Therapy: brief supportive therapy provided. Discussed psychosocial stressors in detail.   -pt is working with a nutrionalist.  We reviewed her weight over the last 1 year. -pt follow up  with sleep doctor- no cpap needed but does recommend weight loss    Consultations: none  Pt denies SI and is at an acute low risk for suicide. Patient told to call clinic if any problems occur.  Patient advised to go to ER if they should develop SI/HI, side effects, or if symptoms worsen. Has crisis numbers to call if needed. Pt verbalized understanding.   F/up in 3 months or sooner if needed   [name], MD 11/17/2018, 10:00 AM

## 2018-11-23 ENCOUNTER — Encounter: Payer: Self-pay | Admitting: Registered"

## 2018-11-23 ENCOUNTER — Encounter: Payer: Medicaid Other | Attending: Pulmonary Disease | Admitting: Registered"

## 2018-11-23 DIAGNOSIS — Z6835 Body mass index (BMI) 35.0-35.9, adult: Secondary | ICD-10-CM | POA: Diagnosis not present

## 2018-11-23 DIAGNOSIS — Z713 Dietary counseling and surveillance: Secondary | ICD-10-CM | POA: Insufficient documentation

## 2018-11-23 DIAGNOSIS — G4733 Obstructive sleep apnea (adult) (pediatric): Secondary | ICD-10-CM | POA: Insufficient documentation

## 2018-11-23 DIAGNOSIS — E669 Obesity, unspecified: Secondary | ICD-10-CM

## 2018-11-23 NOTE — Patient Instructions (Addendum)
-   Try to have breakfast consistently. Aim to have fruit in the morning along with egg whites.    - Aim to for at least 15-25 minutes of physical activity, 2-3 days a week. Use [employer] App.   - Keep up the great work with changes made!

## 2018-11-23 NOTE — Progress Notes (Signed)
  Medical Nutrition Therapy:  Appt start time: 9:25 end time: 9:50.  Assessment:  Primary concerns today: Pt arrives with mom.  Pt expectations: meal planning/prepping ideas, get back on track with exercise   Pt states she has been eating more vegetables. Pt states she has been using low-fat ranch instead of regular ranch. Pt states she has not tried tomatoes yet. Pt states she has been eating more baked items rather than fried options. Pt states she has decreased cookies, cake, and candy intake. Pt states she sometimes does not take medicine at the same time daily. Pt states she has not been eating breakfast consistently; does not like eggs with yolk and working on learning how to cook grits.   Pt states when she thinks of breakfast, she automatically thinks of eggs and she is not a fan of eggs; eats them sometimes. Pt states most dairy causes stomach discomfort except [place] cheese; makes grilled cheese with this sometimes. 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    Preferred Learning Style:   No preference indicated   Learning Readiness:   Contemplating  Ready  Change in progress   MEDICATIONS: See list   DIETARY INTAKE:  Usual eating pattern includes 2 meals and 3-4 snacks per day.  Everyday foods include convenience foods, cookies, candy, fast food, juice, and water.  Avoided foods include milk.    24-hr recall:  B ( AM): sometimes skips; banana Snk ( AM): none L ( PM): chicken + rice + fruit  Snk ( PM): none D ( PM): steak + squash + onions Snk ( PM): none Beverages: water (4 bottles, 64 oz), juice, ginger ale  Usual physical activity: none stated  Estimated energy needs: 2000 calories 225 g carbohydrates 150 g protein 56 g fat  Progress Towards Goal(s):  In progress.   Nutritional Diagnosis:  NI-5.8.5 Inadeqate fiber intake As related to less than optimal food-preparation practices.  As evidenced by pt report of  reliance on convenience, overprocessed foods.    Intervention:  Nutrition education and counseling. Pt was encouraged to keep up the great work  With changes already established. Pt was counseled on ways to implement physical activity while at home. Pt was in agreement with goals listed.  Goals: - Try to have breakfast consistently. Aim to have fruit in the morning along with egg whites.   - Aim to for at least 15-25 minutes of physical activity, 2-3 days a week. Use [employer] Training App.  - Keep up the great work with changes made! microorganisms  Teaching Method Utilized:  Visual Auditory Hands on  Handouts given during visit include:  none  Barriers to learning/adherence to lifestyle change: contemplative stage of change  Demonstrated degree of understanding via:  Teach Back   Monitoring/Evaluation:  Dietary intake, exercise, and body weight in 1 month(s).

## 2018-11-29 ENCOUNTER — Ambulatory Visit (INDEPENDENT_AMBULATORY_CARE_PROVIDER_SITE_OTHER): Payer: Medicaid Other | Admitting: Licensed Clinical Social Worker

## 2018-11-29 DIAGNOSIS — F201 Disorganized schizophrenia: Secondary | ICD-10-CM | POA: Diagnosis not present

## 2018-11-30 ENCOUNTER — Encounter (HOSPITAL_COMMUNITY): Payer: Self-pay | Admitting: Licensed Clinical Social Worker

## 2018-11-30 NOTE — Progress Notes (Signed)
   THERAPIST PROGRESS NOTE  Session Time: 12:30pm-1:25pm  Participation Level: Active  Behavioral Response: CasualAlertAnxious  Type of Therapy: Individual Therapy  Treatment Goals addressed: "I want to work on my anxiety, depression, self-confidence, and my ability to socialize"   Interventions: Motivational Interviewing, CBT, Grounding & Mindfulness Techniques, psychoeducation  Summary: Alexandre Lightsey is a 23 yo. female who presents with Schizophrenia, disorganized type     Suicidal/Homicidal: No -without intent/plan  Therapist Response: Beulah met with clinician for an individual session. Gearldean discussed her psychiatric symptoms, her current life events and her homework. Maymie shared that she has been keeping herself busy baking, watching tv, playing video games on her phone, etc. She reports she feels pretty good the majority of the time, but she feels somewhat bored and would like to do something. Clinician researched personal enrichment classes at [facility] in session and provided her information about cookie decorating classes. Freida has not completed worksheets provided in previous sessions and will continue to work on them for next session.  Plan: Return again in 3-4 weeks  Diagnosis:     Axis I: Schizophrenia, disorganized type     [name], LCSW 11/30/2018

## 2018-12-27 ENCOUNTER — Encounter: Payer: Medicaid Other | Attending: Pulmonary Disease | Admitting: Registered"

## 2018-12-27 ENCOUNTER — Ambulatory Visit: Payer: Self-pay | Admitting: Registered"

## 2018-12-27 ENCOUNTER — Encounter: Payer: Self-pay | Admitting: Registered"

## 2018-12-27 DIAGNOSIS — G4733 Obstructive sleep apnea (adult) (pediatric): Secondary | ICD-10-CM | POA: Diagnosis present

## 2018-12-27 DIAGNOSIS — E669 Obesity, unspecified: Secondary | ICD-10-CM

## 2018-12-27 DIAGNOSIS — Z713 Dietary counseling and surveillance: Secondary | ICD-10-CM | POA: Diagnosis not present

## 2018-12-27 DIAGNOSIS — Z6835 Body mass index (BMI) 35.0-35.9, adult: Secondary | ICD-10-CM | POA: Insufficient documentation

## 2018-12-27 NOTE — Progress Notes (Signed)
  Medical Nutrition Therapy:  Appt start time: 9:30 end time: 9:55.  Assessment:  Primary concerns today: Pt arrives with mom.  Pt expectations: meal planning/prepping ideas, get back on track with exercise   Pt arrives with mom stating she has been eating more vegetables and preparing them in different ways. Pt states she has been roasting vegetables as well as cooking fish and rice. Pt states she tried eggs but didn't like them. Pt states she has not been exercising. Pt states she watches tv, cooks, and cleans during the day.   Pt states she wants to weigh but is afraid.   Pt likes Transformers.  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    Preferred Learning Style:   No preference indicated   Learning Readiness:   Contemplating  Ready  Change in progress   MEDICATIONS: See list   DIETARY INTAKE:  Usual eating pattern includes 2 meals and 3-4 snacks per day.  Everyday foods include convenience foods, cookies, candy, fast food, juice, and water. Avoided foods include milk.    24-hr recall:  B ( AM): ypically skips; fried bologna sandwich  Snk ( AM): none L ( PM): fish + rice + vegetables ([place] blend) Snk ( PM): none D ( PM): leftovers-fish + rice + vegetables ([place] blend) Snk ( PM): none Beverages: water (4 bottles, 64 oz), juice, ginger ale, Lactaid milk  Usual physical activity: none stated  Estimated energy needs: 2000 calories 225 g carbohydrates 150 g protein 56 g fat  Progress Towards Goal(s):  In progress.   Nutritional Diagnosis:  NI-5.8.5 Inadeqate fiber intake As related to less than optimal food-preparation practices.  As evidenced by pt report of reliance on convenience, overprocessed foods.    Intervention:  Nutrition education and counseling. Pt was encouraged to keep up the great work with changes already established. Pt was educated and counseled on breakfast options and encouraged to  include throughout the week. Pt was in agreement with goals listed.  Goals: - Aim to have breakfast at least 3 days a week: fruit + egg whites or bologna sandwich or cereal + Lactaid milk or [place] sandwich, etc.  - Reset password for [employer] App or create a new account.   Teaching Method Utilized:  Visual Auditory Hands on  Handouts given during visit include:  none  Barriers to learning/adherence to lifestyle change: contemplative stage of change  Demonstrated degree of understanding via:  Teach Back   Monitoring/Evaluation:  Dietary intake, exercise, and body weight in 1 month(s).

## 2018-12-27 NOTE — Patient Instructions (Addendum)
-   Aim to have breakfast at least 3 days a week: fruit + egg whites or bologna sandwich or cereal + Lactaid milk or [place] sandwich, etc.   - Reset password for [employer] App or create a new account.

## 2018-12-28 ENCOUNTER — Ambulatory Visit (HOSPITAL_COMMUNITY): Payer: Medicaid Other | Admitting: Licensed Clinical Social Worker

## 2018-12-29 ENCOUNTER — Ambulatory Visit: Payer: Self-pay | Admitting: Registered"

## 2019-01-05 ENCOUNTER — Ambulatory Visit: Payer: Self-pay | Admitting: Neurology

## 2019-01-10 ENCOUNTER — Ambulatory Visit: Payer: Medicaid Other | Admitting: Neurology

## 2019-01-10 ENCOUNTER — Other Ambulatory Visit: Payer: Self-pay

## 2019-01-10 ENCOUNTER — Encounter: Payer: Self-pay | Admitting: Neurology

## 2019-01-10 VITALS — BP 110/68 | HR 91 | Ht 58.5 in | Wt 185.0 lb

## 2019-01-10 DIAGNOSIS — G44209 Tension-type headache, unspecified, not intractable: Secondary | ICD-10-CM | POA: Diagnosis not present

## 2019-01-10 NOTE — Patient Instructions (Signed)
Happy to hear that we are doing great! Continue follow-up with your psychiatrist. Follow-up as needed, call for any changes

## 2019-01-10 NOTE — Progress Notes (Signed)
NEUROLOGY FOLLOW UP OFFICE NOTE  Natalie Wagner [id number]  DOB: 09-23-1996  HISTORY OF PRESENT ILLNESS: I had the pleasure of seeing Natalie Wagner in follow-up in the neurology clinic on 01/10/2019. She is again accompanied by her mother's friend who helps supplement the history today. The patient was last seen 8 months ago for headaches and new onset seizure last 08/26/15. Her mother previously reported concern for seizures as an infant, but she was never started on seizure medication. Imaging and 24-hour EEG were normal. No further seizures with loss of consciousness or shaking spells since October 2016. She had stopped Topamax almost a year ago due to daytime drowsiness, off Topamax she has done well and states she is not having any headaches. She denies any dizziness, diplopia, no falls. She has occasional paresthesias in her hands and feet and some knee/ankle pain. Mood is good, she continues with Psychiatry follow-up for schizophrenia.Sleep is better, she is getting a lot of rest.   HPI: This is a 23 yo RH woman with a history of schizophrenia who presented after a seizure last 08/26/15. She recalls waking up then starting to feel dizzy and lightheaded, then heard her mother screaming and waking up on the couch. Her mother reports she lost consciousness and started shaking, rocking side to side. No tongue bite or incontinence. She was brought to [facility] ER where CBC and BMP were normal, no urine drug screen done. I personally reviewed head CT without contrast which was normal. Her mother reports she was born premature at 12 weeks and had delayed development with occupation and speech therapy as a child. She was in special education classes. As a baby, she would have episodes where she would get stiff as a board and shake, then in the 2nd grade she passed out with shaking and incontinence. She also had brief staring spells occurring around 3 times a week. She had seen pediatric neurologist Dr.  Gaynell Face at that time, mother is unsure of diagnosis but denies any treatment for seizures. There is note that symptoms were felt to be due to migraines. The last staring spell was in the 7th grade. She was diagnosed with schizophrenia in the 9th grade, she was having visual hallucinations and would forget who she was, suddenly drinking from a fountain and spitting it out. She was admitted to inpatient psychiatry for 2 weeks and has been taking Zyprexa for the past 3 years. She denies any more hallucinations, but her mother is concerned about "strange behaviors" the past month. In the waiting room today, she was staring at the form and her mother told her to write something down. She then wrote over what her mother had filled out. She talks as if there is a third person in the room, sometimes she "just talks and does not know it." She closes and opens doors repetitively. She lives with her mother and denies any alcohol intake. She reports good sleep.   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 There is no history of febrile convulsions, CNS infections such as meningitis/encephalitis, significant traumatic brain injury, neurosurgical procedures, or family history of seizures.  Diagnostic Data: MRI brain with and without contrast done 01/14/15 for galactorrhea. I personally reviewed images, no acute changes, hippocampi symmetric with no abnormal signal or enhancement seen. Pituitary gland normal. EEG as above  I personally reviewed MRI brain with and without contrast 10/21/15 which was normal, hippocampi symmetric with no abnormal signal or enhancement seen. Her 1-hour  sleep-deprived EEG, as well as 24-hour EEG were normal. Typical events were not captured.    PAST MEDICAL HISTORY: Past Medical History:  Diagnosis Date  . Asthma    prn inhaler  . Constipation 05/05/2013  . Difficulty swallowing pills   . Elevated prolactin level ([place]) 12/27/2014  . Galactorrhea 12/27/2014    . History of seizures as a child    mother states were triggered by migraines; no seizures in > 7 yr.  . 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 Migraines   . Schizophrenia ([place])   . Sleep apnea     MEDICATIONS: Current Outpatient Medications on File Prior to Visit  Medication Sig Dispense Refill  . albuterol (PROAIR HFA) 108 (90 Base) MCG/ACT inhaler Inhale 2 puffs into the lungs every 4 (four) hours as needed for wheezing or shortness of breath. 1 Inhaler 1  . benztropine (COGENTIN) 1 MG tablet Take 1 tablet (1 mg total) by mouth 2 (two) times daily. 180 tablet 0  . fluticasone (FLONASE) 50 MCG/ACT nasal spray Place 2 sprays into both nostrils 2 (two) times daily. 16 g 11  . hyoscyamine (LEVSIN SL) 0.125 MG SL tablet Place 1 tablet (0.125 mg total) under the tongue every 4 (four) hours as needed. 180 tablet 5  . levonorgestrel (MIRENA) 20 MCG/24HR IUD 1 Intra Uterine Device (1 each total) by Intrauterine route once. 1 each 0  . Melatonin 5 MG TABS Take 1 tablet by mouth daily as needed (sleep).     . OLANZapine (ZYPREXA) 20 MG tablet Take 1 tablet (20 mg total) by mouth at bedtime. 90 tablet 0  . Olopatadine HCl (PAZEO) 0.7 % SOLN Place 1 mL into both eyes daily as needed (allergies).     . polyethylene glycol powder (GLYCOLAX/MIRALAX) powder Take 1/2 capful daily 255 g 3  . RESTASIS 0.05 % ophthalmic emulsion 1 DROP TWICE A DAY INTO BOTH EYES  3  . topiramate (TOPAMAX) 25 MG tablet Take 1 tablet at night (Patient not taking: Reported on 09/05/2018) 90 tablet 3   No current facility-administered medications on file prior to visit.     ALLERGIES: Allergies  Allergen Reactions  . Lactose Intolerance (Gi) Diarrhea    FAMILY HISTORY: Family History  Problem Relation Age of Onset  . Diabetes Maternal Grandfather   . Hypertension Maternal Grandfather   . Heart disease Maternal Grandfather   . Kidney disease Maternal  Grandfather   . Anesthesia problems Maternal Grandfather        hx. of being hard to wake up post-op  . Hypertension Maternal Grandmother   . Stroke Maternal Grandmother   . Asthma Mother   . Hypertension Maternal Uncle   . Depression Sister   . Cancer Other     SOCIAL HISTORY: Social History   Socioeconomic History  . Marital status: Single    Spouse name: Not on file  . Number of children: Not on file  . Years of education: Not on file  . Highest education level: Not on file  Occupational History  . Not on file  Social Needs  . Financial resource strain: Not on file  . Food insecurity:    Worry: Not on file    Inability: Not on file  . Transportation needs:    Medical: Not on file    Non-medical: Not on file  Tobacco Use  . Smoking status: Never Smoker  . Smokeless  tobacco: Never Used  Substance and Sexual Activity  . Alcohol use: No    Alcohol/week: 0.0 standard drinks  . Drug use: No  . Sexual activity: Never    Birth control/protection: I.U.D.  Lifestyle  . Physical activity:    Days per week: Not on file    Minutes per session: Not on file  . Stress: Not on file  Relationships  . Social connections:    Talks on phone: Not on file    Gets together: Not on file    Attends religious service: Not on file    Active member of club or organization: Not on file    Attends meetings of clubs or organizations: Not on file    Relationship status: Not on file  . Intimate partner violence:    Fear of current or ex partner: Not on file    Emotionally abused: Not on file    Physically abused: Not on file    Forced sexual activity: Not on file  Other Topics Concern  . Not on file  Social History Narrative  . Not on file    REVIEW OF SYSTEMS: Constitutional: No fevers, chills, or sweats, no generalized fatigue, change in appetite Eyes: No visual changes, double vision, eye pain Ear, nose and throat: No hearing loss, ear pain, nasal congestion, sore  throat Cardiovascular: No chest pain, palpitations Respiratory:  No shortness of breath at rest or with exertion, wheezes GastrointestinaI: No nausea, vomiting, diarrhea, abdominal pain, fecal incontinence Genitourinary:  No dysuria, urinary retention or frequency Musculoskeletal:  No neck pain, back pain Integumentary: No rash, pruritus, skin lesions Neurological: as above Psychiatric: No depression, insomnia, anxiety Endocrine: No palpitations, fatigue, diaphoresis, mood swings, change in appetite, change in weight, increased thirst Hematologic/Lymphatic:  No anemia, purpura, petechiae. Allergic/Immunologic: No itchy/runny eyes, nasal congestion, no recent allergic reactions, rashes  PHYSICAL EXAM: Vitals:   01/10/19 1020  BP: 110/68  Pulse: 91  SpO2: 99%   General: No acute distress Head:  Normocephalic/atraumatic Neck: supple, no paraspinal tenderness, full range of motion Heart:  Regular rate and rhythm Lungs:  Clear to auscultation bilaterally Back: No paraspinal tenderness Skin/Extremities: No rash, no edema Neurological Exam: alert and oriented to person, place, and time. No aphasia or dysarthria. Fund of knowledge is appropriate.  Recent and remote memory are intact.  Attention and concentration are normal.    Able to name objects and repeat phrases. Cranial nerves: Pupils equal, round, reactive to light. Extraocular movements intact with no nystagmus. Visual fields full. Facial sensation intact. No facial asymmetry. Tongue, uvula, palate midline.  Motor: Bulk and tone normal, muscle strength 5/5 throughout with no pronator drift.  Sensation to light touch intact.  No extinction to double simultaneous stimulation.  Deep tendon reflexes 2+ throughout, toes downgoing.  Finger to nose testing intact.  Gait narrow-based and steady, able to tandem walk adequately.  Romberg negative.  IMPRESSION: This is a 23 yo RH woman with a history of schizophrenia, with a witnessed convulsion  last 08/26/15. Her mother reported episodes concerning for possible seizures as a child (staring spells, stiffening episodes), however she was never started on seizure medication by pediatric neurology. Her 24-hour EEG is normal. MRI brain normal. No further similar symptoms since October 2016. She was initially having frequent headaches that had good response to Topamax, however patient stopped Topamax due to daytime drowsiness. Off Topamax, headaches have not recurred, she reports doing well with no headaches in the past 6 months. She will follow-up on  a prn basis and knows to call for any changes.   Thank you for allowing me to participate in her care.  Please do not hesitate to call for any questions or concerns.  The duration of this appointment visit was 15 minutes of face-to-face time with the patient.  Greater than 50% of this time was spent in counseling, explanation of diagnosis, planning of further management, and coordination of care.   Ellouise Newer, M.D.   CC: Dr. Vanetta Shawl

## 2019-01-18 ENCOUNTER — Ambulatory Visit (HOSPITAL_COMMUNITY): Payer: Self-pay | Admitting: Licensed Clinical Social Worker

## 2019-01-23 ENCOUNTER — Ambulatory Visit: Payer: Self-pay | Admitting: Registered"

## 2019-01-24 ENCOUNTER — Encounter: Payer: Medicaid Other | Attending: Pulmonary Disease | Admitting: Registered"

## 2019-01-24 ENCOUNTER — Other Ambulatory Visit: Payer: Self-pay

## 2019-01-24 DIAGNOSIS — Z6835 Body mass index (BMI) 35.0-35.9, adult: Secondary | ICD-10-CM | POA: Insufficient documentation

## 2019-01-24 DIAGNOSIS — E669 Obesity, unspecified: Secondary | ICD-10-CM

## 2019-01-24 DIAGNOSIS — Z713 Dietary counseling and surveillance: Secondary | ICD-10-CM | POA: Insufficient documentation

## 2019-01-24 DIAGNOSIS — G4733 Obstructive sleep apnea (adult) (pediatric): Secondary | ICD-10-CM | POA: Diagnosis present

## 2019-01-24 NOTE — Patient Instructions (Signed)
-   Reset password for [employer] or create a new account.   - Utilize free lunch options for 2 sons during the time when school is out to help with food access.   - Follow [employer] on social media.   - Keep up the great work having 3 meals a day, eating more vegetables, cooking more things, and eating out less often. You are doing phenomenal!

## 2019-01-24 NOTE — Progress Notes (Signed)
  Medical Nutrition Therapy:  Appt start time: 8:30 end time: 9:00.  Assessment:  Primary concerns today:   Pt expectations: meal planning/prepping ideas, get back on track with exercise   Pt states she has been cooking more, accomplished making grits to her satisfaction. Pt states she has been baking fish and chicken; prefers chicken and eating more vegetables. Pt states she has been eating less fast food. Mom asked about Ensure and pt wants to drink them, but pt was encouraged to only drink Ensure when unable to have breakfast. Mom has not been able to restock with groceries related to recent COVID-19 pandemic.  Pt states she has not been exercising. Pt states she watches tv, cooks, and cleans during the day.   Pt states she wants to weigh but is afraid. Mom is very weigh-focused, comparing pictures from younger of when she and pt were "skinnier".  Pt likes Transformers.  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    Preferred Learning Style:   No preference indicated   Learning Readiness:   Contemplating  Ready  Change in progress   MEDICATIONS: See list   DIETARY INTAKE:  Usual eating pattern includes 1-2 meals and 3-4 snacks per day.  Everyday foods include convenience foods, cookies, candy, fast food, juice, and water. Avoided foods include milk.    24-hr recall:  B ( AM): salmon or fried bologna sandwich  Snk ( AM): none L ( PM): oranges or fish + rice + vegetables ([place] blend) Snk ( PM): none D ( PM): cereal or leftovers-fish + rice + vegetables ([place] blend) Snk ( PM): none Beverages: water (4 bottles, 64 oz), juice, ginger ale, Lactaid milk  Usual physical activity: none stated  Estimated energy needs: 2000 calories 225 g carbohydrates 150 g protein 56 g fat  Progress Towards Goal(s):  In progress.   Nutritional Diagnosis:  NI-5.8.5 Inadeqate fiber intake As related to less than optimal  food-preparation practices.  As evidenced by pt report of reliance on convenience, overprocessed foods.    Intervention:  Nutrition education and counseling. Pt was encouraged to keep up the great work with changes already established. Pt and mom were provided free lunch resource options and other resources in the community to take advantage of during this time. Pt was in agreement with goals listed.  Goals: - Reset password for [employer] App or create a new account.  - Utilize free lunch options for 2 sons during the time when school is out to help with food access.  - Follow [employer] on social media.  - Keep up the great work having 3 meals a day, eating more vegetables, cooking more things, and eating out less often. You are doing phenomenal!  Teaching Method Utilized:  Visual Auditory Hands on  Handouts given during visit include:  none  Barriers to learning/adherence to lifestyle change: contemplative stage of change  Demonstrated degree of understanding via:  Teach Back   Monitoring/Evaluation:  Dietary intake, exercise, and body weight in 1 month(s).

## 2019-01-25 ENCOUNTER — Ambulatory Visit: Payer: Self-pay | Admitting: Registered"

## 2019-02-13 ENCOUNTER — Other Ambulatory Visit: Payer: Self-pay

## 2019-02-13 ENCOUNTER — Ambulatory Visit (INDEPENDENT_AMBULATORY_CARE_PROVIDER_SITE_OTHER): Payer: Medicaid Other | Admitting: Licensed Clinical Social Worker

## 2019-02-13 ENCOUNTER — Other Ambulatory Visit (HOSPITAL_COMMUNITY): Payer: Self-pay

## 2019-02-13 ENCOUNTER — Encounter (HOSPITAL_COMMUNITY): Payer: Self-pay | Admitting: Licensed Clinical Social Worker

## 2019-02-13 DIAGNOSIS — F201 Disorganized schizophrenia: Secondary | ICD-10-CM

## 2019-02-13 MED ORDER — BENZTROPINE MESYLATE 1 MG PO TABS: 1.0000 mg | ORAL_TABLET | Freq: Two times a day (BID) | ORAL | 0 refills | Status: DC

## 2019-02-13 NOTE — Progress Notes (Signed)
Virtual Visit via Telephone Note  I connected with Natalie Wagner on 02/13/19 at  9:00 AM EDT by telephone and verified that I am speaking with the correct person using two identifiers.   I discussed the limitations, risks, security and privacy concerns of performing an evaluation and management service by telephone and the availability of in person appointments. I also discussed with the patient that there may be a patient responsible charge related to this service. The patient expressed understanding and agreed to proceed.   Type of Therapy: Individual Therapy  Treatment Goals addressed: "I want to work on my anxiety, depression, self-confidence, and my ability to socialize"   Interventions: Motivational Interviewing, CBT, Grounding & Mindfulness Techniques, psychoeducation  Summary: Natalie Wagner is a 23 yo. female who presents with Schizophrenia, disorganized type     Suicidal/Homicidal: No -without intent/plan  Therapist Response: Chrishawna met with clinician for an individual session. Miaa discussed her psychiatric symptoms, her current life events and her homework. Koryn shared that she has been staying in her house since the COVID-19 orders have come through. She identified feeling okay, but having some anxiety due to being indoors all the time and reading the news daily. Clinician explored goals of keeping up to date on the news. Clinician encouraged [name] to limit her exposure to news on a constant basis and just check in once or twice a day to be informed. Clinician discussed the connection between anxiety and reading the news stories and noted increased following exposure to this stressful information.  [name] identified that she was reading anxiety packets daily and working on deep breathing, relaxation techniques, and gratitude.  She reports she is running low on Cogentin. This was reported to nurse and refill will be submitted.   Plan: Return again in 2  weeks  Diagnosis:     Axis I: Schizophrenia, disorganized type                                  I discussed the assessment and treatment plan with the patient. The patient was provided an opportunity to ask questions and all were answered. The patient agreed with the plan and demonstrated an understanding of the instructions.   The patient was advised to call back or seek an in-person evaluation if the symptoms worsen or if the condition fails to improve as anticipated.  I provided 40 minutes of non-face-to-face time during this encounter.   [name], LCSW

## 2019-02-16 ENCOUNTER — Ambulatory Visit (INDEPENDENT_AMBULATORY_CARE_PROVIDER_SITE_OTHER): Payer: Medicaid Other | Admitting: Psychiatry

## 2019-02-16 ENCOUNTER — Other Ambulatory Visit: Payer: Self-pay

## 2019-02-16 DIAGNOSIS — F201 Disorganized schizophrenia: Secondary | ICD-10-CM

## 2019-02-16 MED ORDER — OLANZAPINE 20 MG PO TABS: 20.0000 mg | ORAL_TABLET | Freq: Every day | ORAL | 0 refills | Status: DC

## 2019-02-16 MED ORDER — BENZTROPINE MESYLATE 1 MG PO TABS: 1.0000 mg | ORAL_TABLET | Freq: Two times a day (BID) | ORAL | 0 refills | Status: DC

## 2019-02-16 NOTE — Progress Notes (Signed)
Virtual Visit via Telephone Note  I connected with [name] on 02/16/19 at  9:30 AM EDT by telephone and verified that I am speaking with the correct person using two identifiers.   I discussed the limitations, risks, security and privacy concerns of performing an evaluation and management service by telephone and the availability of in person appointments. I also discussed with the patient that there may be a patient responsible charge related to this service. The patient expressed understanding and agreed to proceed.   History of Present Illness: I spoke with Natalie Wagner on the phone.  She tells me that overall she is doing well.  She has been having sessions with her therapist and is actively working on the homework worksheets that she gets.  Natalie Wagner has been trying to do the exercises that she learned from therapy when she is feeling down.  She tells me that most days she does feel depressed and is largely unmotivated to do anything.  Her family is around her to lift her spirits and she will then engage in the coping exercises she learned.  She is sleeping well now.  She is working on weight loss as of yet she is not needing a CPAP per the sleep doctor.  She is working on a daily routine and is not napping during the day.  She tells me that she does have spikes of anxiety 1-2 times a day.  During this time she will feel very nervous have racing thoughts and feel jittery.  Lasts about 10 minutes and improves when she distracts herself and uses coping skills.  She spends a lot of time in the day trying to stay busy with chores, helping around the house, coloring and watching TV.  She denies any suicidal or homicidal ideations.  She is denying paranoia and hallucinations and ideas of reference.  She is been compliant with her medications and notes that there is a significant difference in psychosis when she is compliant with her medications.  I spoke with mom after speaking with Natalie Wagner.  Mom states  that she has no concerns and "I think she is doing fine".  Mom denies any recent outbursts or defiant behavior.  Mom believes this is partially due to the fact that she is no longer working nights and so that [name] feels more secure.   Observations/Objective: I spoke with Natalie Wagner over the phone.  She was calm, cooperative, pleasant and engaged in the conversation.  Her speech was clear and coherent with a normal rate and volume.  She overall expressed monotone when speaking.  There was some mild latency when responding.  Mood is euthymic and affect is blunted.  Thought processes were linear, concrete and intact.  Thought content was within normal limits.  She did not appear to be responding to internal stimuli.  Natalie Wagner is denying auditory and visual hallucinations and denies SI/HI.  Her concentration and attention were good.  Her fund of knowledge and use of language are fair.  Her insight and judgment are shallow but seem to be improving as compared to previous interactions.  I am unable to comment on General appearance, hygiene, eye contact or psychomotor activity as I was unable to physically see the patient.  Assessment and Plan: Schizophrenia-undifferentiated type; oppositional defiant disorder; insomnia; rule out eating disorder Continue Zyprexa 20 mg p.o. nightly Continue Cogentin 1 mg p.o. twice daily  Follow Up Instructions: Follow-up with me on April 20, 2019 at 11 AM   I discussed the assessment  and treatment plan with the patient. The patient was provided an opportunity to ask questions and all were answered. The patient agreed with the plan and demonstrated an understanding of the instructions.   The patient was advised to call back or seek an in-person evaluation if the symptoms worsen or if the condition fails to improve as anticipated.  I provided 20 minutes of non-face-to-face time during this encounter.   [name], MD

## 2019-02-21 ENCOUNTER — Encounter: Payer: Medicaid Other | Attending: Pulmonary Disease | Admitting: Registered"

## 2019-02-21 DIAGNOSIS — Z6835 Body mass index (BMI) 35.0-35.9, adult: Secondary | ICD-10-CM | POA: Insufficient documentation

## 2019-02-21 DIAGNOSIS — E669 Obesity, unspecified: Secondary | ICD-10-CM

## 2019-02-21 DIAGNOSIS — G4733 Obstructive sleep apnea (adult) (pediatric): Secondary | ICD-10-CM | POA: Insufficient documentation

## 2019-02-21 DIAGNOSIS — Z713 Dietary counseling and surveillance: Secondary | ICD-10-CM | POA: Insufficient documentation

## 2019-02-21 NOTE — Progress Notes (Signed)
Telephone Encounter Medical Nutrition Therapy:  Appt start time: 8:00 end time: 8:12  Assessment:  Primary concerns today:   Pt expectations: meal planning/prepping ideas, get back on track with exercise   Pt states she has been having more vegetables and baked items. Pt reports having 3 meals a day and one meal including vegetables. Pt states she didn't have vegetables with dinner last night because she was unable to get salad from the grocery store due to store not being stocked and lines being long. Pt states she deleted the fitness app on her phone because she did not have enough storage. Pt states she has not been able to walk outside since previous visit.   Pt states she has not been exercising. Pt states she watches tv, cooks, and cleans during the day.   Pt states she wants to weigh but is afraid. Mom is very weigh-focused, comparing pictures from younger of when she and pt were "skinnier".  Pt likes Transformers.  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    Preferred Learning Style:   No preference indicated   Learning Readiness:   Contemplating  Ready  Change in progress   MEDICATIONS: See list   DIETARY INTAKE:  Usual eating pattern includes 1-2 meals and 3-4 snacks per day.  Everyday foods include convenience foods, cookies, candy, fast food, juice, and water. Avoided foods include milk.    24-hr recall:  B ( AM): bagel with cream cheese + banana or salmon or fried bologna sandwich  Snk ( AM): none L ( PM): microwave dinner-chicken fried rice with vegetables + lemonade or oranges or fish + rice + vegetables ([place] blend) Snk ( PM): banana + orange + carrots D ( PM): lasagna + lemonade or cereal or leftovers-fish + rice + vegetables ([place] blend) Snk ( PM): none Beverages: lemonade, water (2 bottles, 32 oz)  Usual physical activity: none stated  Estimated energy needs: 2000 calories 225 g  carbohydrates 150 g protein 56 g fat  Progress Towards Goal(s):  In progress.   Nutritional Diagnosis:  NI-5.8.5 Inadeqate fiber intake As related to less than optimal food-preparation practices.  As evidenced by pt report of reliance on convenience, overprocessed foods.    Intervention:  Nutrition education and counseling. Pt was encouraged to keep up the great work with changes already established. Pt was counseled on ways to have vegetables with meals even if unable to get desired items from the store. Pt was in agreement with goals listed.  Goals: - Try to drink at least 64 ounces of water a day.  - Have vegetables with lunch and dinner. This can be canned, frozen, or fresh vegetables.   Teaching Method Utilized:  Visual Auditory Hands on  Handouts given during visit include:  none  Barriers to learning/adherence to lifestyle change: contemplative stage of change  Demonstrated degree of understanding via:  Teach Back   Monitoring/Evaluation:  Dietary intake, exercise, and body weight in 1 month(s).

## 2019-02-21 NOTE — Patient Instructions (Addendum)
-   Try to drink at least 64 ounces of water a day.   - Have vegetables with lunch and dinner. This can be canned, frozen, or fresh vegetables.

## 2019-03-02 ENCOUNTER — Ambulatory Visit (INDEPENDENT_AMBULATORY_CARE_PROVIDER_SITE_OTHER): Payer: Medicaid Other | Admitting: Licensed Clinical Social Worker

## 2019-03-02 ENCOUNTER — Encounter (HOSPITAL_COMMUNITY): Payer: Self-pay | Admitting: Licensed Clinical Social Worker

## 2019-03-02 ENCOUNTER — Other Ambulatory Visit: Payer: Self-pay

## 2019-03-02 DIAGNOSIS — F201 Disorganized schizophrenia: Secondary | ICD-10-CM

## 2019-03-02 NOTE — Progress Notes (Signed)
Virtual Visit via Telephone Note  I connected with Natalie Wagner on 03/02/19 at 11:00 AM EDT by telephone and verified that I am speaking with the correct person using two identifiers.   I discussed the limitations, risks, security and privacy concerns of performing an evaluation and management service by telephone and the availability of in person appointments. I also discussed with the patient that there may be a patient responsible charge related to this service. The patient expressed understanding and agreed to proceed.   Type of Therapy: Individual Therapy   Treatment Goals addressed: "I want to work on my anxiety, depression, self-confidence, and my ability to socialize"    Interventions: Motivational Interviewing, CBT, Grounding & Mindfulness Techniques, psychoeducation   Summary: Natalie Wagner is a 23 yo. female who presents with Schizophrenia, disorganized type    Suicidal/Homicidal: No -without intent/plan   Therapist Response:  Marleta met with clinician for an individual session. Anija discussed her psychiatric symptoms, her current life events and her homework. Mallorie shared that she has been staying in her house, cooking, watching tv, and trying to control her anxiety. Callista identified that she is watching the news some, but also trying to balance it out with some funny shows and movies. Clinician explored using exercise as a coping skill, particularly taking a walk. Clinician provided guidelines recommended by the governor, including walking in your neighborhood, as safe practice, as long as she stays socially distant with others. Clinician discussed importance of remaining socially connected with friends and family over the phone and social media in order to feel connected and less depressed.   Plan: Return again in 4 weeks   Diagnosis: Axis I: Schizophrenia, disorganized type   I discussed the assessment and treatment plan with the patient. The patient was  provided an opportunity to ask questions and all were answered. The patient agreed with the plan and demonstrated an understanding of the instructions.   The patient was advised to call back or seek an in-person evaluation if the symptoms worsen or if the condition fails to improve as anticipated.  I provided 30 minutes of non-face-to-face time during this encounter.   [name], LCSW

## 2019-03-21 ENCOUNTER — Ambulatory Visit: Payer: Self-pay | Admitting: Registered"

## 2019-03-29 ENCOUNTER — Ambulatory Visit (INDEPENDENT_AMBULATORY_CARE_PROVIDER_SITE_OTHER): Payer: Medicaid Other | Admitting: Licensed Clinical Social Worker

## 2019-03-29 ENCOUNTER — Encounter (HOSPITAL_COMMUNITY): Payer: Self-pay | Admitting: Licensed Clinical Social Worker

## 2019-03-29 ENCOUNTER — Other Ambulatory Visit: Payer: Self-pay

## 2019-03-29 DIAGNOSIS — F201 Disorganized schizophrenia: Secondary | ICD-10-CM

## 2019-03-29 NOTE — Progress Notes (Signed)
Virtual Visit via Telephone Note  I connected with Natalie Wagner on 03/29/19 at 11:00 AM EDT by telephone and verified that I am speaking with the correct person using two identifiers.    I discussed the limitations, risks, security and privacy concerns of performing an evaluation and management service by telephone and the availability of in person appointments. I also discussed with the patient that there may be a patient responsible charge related to this service. The patient expressed understanding and agreed to proceed.  Type of Therapy: Individual Therapy   Treatment Goals addressed: "I want to work on my anxiety, depression, self-confidence, and my ability to socialize"    Interventions: Motivational Interviewing, CBT, Grounding & Mindfulness Techniques, psychoeducation   Summary: Natalie Wagner is a 23 yo. female who presents with Schizophrenia, disorganized type    Suicidal/Homicidal: No -without intent/plan   Therapist Response:  Payton met with clinician for an individual session. Finola discussed her psychiatric symptoms, her current life events and her homework. Leauna shared that she has been doing okay, but she still needs to work on some depression and anxiety coping skills. Clinician explored triggers and noted that the quarantine has been okay, but she is missing being able to go to stores and be out and about. Clinician discussed thought patterns and identified some anxiety and worst-case scenario thoughts. Clinician provided and reviewed a few work sheets on anxiety, as well as introduced mindfulness. Clinician provided worksheets with several Mindfulness activities. Clinician also provided internet search words on Youtube for Mindfulness and progressive muscle relaxation.   Plan: Return again in 4 weeks   Diagnosis: Axis I: Schizophrenia, disorganized type    I discussed the assessment and treatment plan with the patient. The patient was provided an  opportunity to ask questions and all were answered. The patient agreed with the plan and demonstrated an understanding of the instructions.   The patient was advised to call back or seek an in-person evaluation if the symptoms worsen or if the condition fails to improve as anticipated.  I provided 45 minutes of non-face-to-face time during this encounter.   [name], LCSW

## 2019-04-18 ENCOUNTER — Encounter: Payer: Medicaid Other | Attending: Pulmonary Disease | Admitting: Registered"

## 2019-04-18 DIAGNOSIS — G4733 Obstructive sleep apnea (adult) (pediatric): Secondary | ICD-10-CM | POA: Insufficient documentation

## 2019-04-18 DIAGNOSIS — Z713 Dietary counseling and surveillance: Secondary | ICD-10-CM | POA: Insufficient documentation

## 2019-04-18 DIAGNOSIS — Z6835 Body mass index (BMI) 35.0-35.9, adult: Secondary | ICD-10-CM | POA: Insufficient documentation

## 2019-04-20 ENCOUNTER — Ambulatory Visit (INDEPENDENT_AMBULATORY_CARE_PROVIDER_SITE_OTHER): Payer: Medicaid Other | Admitting: Psychiatry

## 2019-04-20 ENCOUNTER — Encounter (HOSPITAL_COMMUNITY): Payer: Self-pay | Admitting: Psychiatry

## 2019-04-20 ENCOUNTER — Other Ambulatory Visit: Payer: Self-pay

## 2019-04-20 DIAGNOSIS — F201 Disorganized schizophrenia: Secondary | ICD-10-CM | POA: Diagnosis not present

## 2019-04-20 MED ORDER — OLANZAPINE 20 MG PO TABS: 20.0000 mg | ORAL_TABLET | Freq: Every day | ORAL | 0 refills | Status: DC

## 2019-04-20 MED ORDER — BENZTROPINE MESYLATE 1 MG PO TABS: 1.0000 mg | ORAL_TABLET | Freq: Two times a day (BID) | ORAL | 0 refills | Status: DC

## 2019-04-20 NOTE — Progress Notes (Signed)
Virtual Visit via Telephone Note  I connected with Natalie Wagner on 04/20/19 at  3:45 PM EDT by telephone and verified that I am speaking with the correct person using two identifiers.  Location: Patient: Home Provider: Office   I discussed the limitations, risks, security and privacy concerns of performing an evaluation and management service by telephone and the availability of in person appointments. I also discussed with the patient that there may be a patient responsible charge related to this service. The patient expressed understanding and agreed to proceed.   History of Present Illness: Pt states she is doing well. She is home and not really doing much of anything. She talks with her mom and is eating healthier. The depression is a little better with meds and therapy. She continues to feel unmotivated and tired. She states it is because she is home and can not go out.  She has not made much of an effort to develop any sort of daily routine.  She knows it would be helpful.  She says that she sometimes looks through the papers that she was given for coping skills.  She is denying any paranoia, ideas of reference and hallucinations.  She is very fearful of getting sick and dying with COVID as her grandmother did.  She states that anytime she passes by a hospital she wonders if there are ghosts or other things there.  She believes this is due to watching too many shows on TV.   Observations/Objective: Drenda is calm and cooperative on the phone today.  She is engaged in the conversation and her attention and concentration were good.  Speech is of slow rate and is somewhat monotone as per her usual.  She is endorsing depression and affect is congruent.  Thought processes are coherent and circumstantial.  Thought content is with some paranoia.  Use of language and fund of knowledge are average.  She denies SI/HI.  She denies AVH and did not appear to be responding to internal stimuli.  Her  insight and judgment are on the poor side.  I am unable to comment on eye contact, general appearance or psychomotor activity as I was unable to physically see the patient.  Assessment and Plan: Schizophrenia-undifferentiated type; oppositional defiant disorder; insomnia; rule out eating disorder   Continue Zyprexa 20 mg p.o. nightly Continue Cogentin 1 mg p.o. twice daily   Follow Up Instructions: 38mo or sooner if needed   I discussed the assessment and treatment plan with the patient. The patient was provided an opportunity to ask questions and all were answered. The patient agreed with the plan and demonstrated an understanding of the instructions.   The patient was advised to call back or seek an in-person evaluation if the symptoms worsen or if the condition fails to improve as anticipated.  I provided 15 minutes of non-face-to-face time during this encounter.   [name], MD

## 2019-04-25 ENCOUNTER — Other Ambulatory Visit: Payer: Self-pay

## 2019-04-25 ENCOUNTER — Encounter (HOSPITAL_COMMUNITY): Payer: Self-pay | Admitting: Licensed Clinical Social Worker

## 2019-04-25 ENCOUNTER — Ambulatory Visit (INDEPENDENT_AMBULATORY_CARE_PROVIDER_SITE_OTHER): Payer: Medicaid Other | Admitting: Licensed Clinical Social Worker

## 2019-04-25 DIAGNOSIS — F201 Disorganized schizophrenia: Secondary | ICD-10-CM | POA: Diagnosis not present

## 2019-04-25 NOTE — Progress Notes (Signed)
Virtual Visit via Telephone Note  I connected with Natalie Wagner on 04/25/19 at 10:00 AM EDT by telephone and verified that I am speaking with the correct person using two identifiers.    I discussed the limitations, risks, security and privacy concerns of performing an evaluation and management service by telephone and the availability of in person appointments. I also discussed with the patient that there may be a patient responsible charge related to this service. The patient expressed understanding and agreed to proceed.  Type of Therapy: Individual Therapy   Treatment Goals addressed: "I want to work on my anxiety, depression, self-confidence, and my ability to socialize"    Interventions: Motivational Interviewing, CBT, Grounding & Mindfulness Techniques, psychoeducation   Summary: Natalie Wagner is a 23 yo. female who presents with Schizophrenia, disorganized type    Suicidal/Homicidal: No -without intent/plan   Therapist Response:  Ariannie met with clinician for an individual session. Natalie Wagner discussed her psychiatric symptoms, her current life events and her homework. Natalie Wagner shared that she continues to stay in the house most of the time. She reports she has been listening to [employer], cooking, watching tv, etc. She identified using anxiety workbooks and meditation to decrease her anxiety and they have been helpful. She also identified that she has been staying away from her grandmother due to concern about germs. Clinician explored other options for Natalie Wagner to communicate, as grandmother is non-verbal due to tracheotomy. Clinician encouraged Natalie Wagner to write grandmother a letter or draw her a picture, just to let her know she is thinking about her and missing her.    Plan: Return again in 4 weeks   Diagnosis: Axis I: Schizophrenia, disorganized type    I discussed the assessment and treatment plan with the patient. The patient was provided an opportunity to ask  questions and all were answered. The patient agreed with the plan and demonstrated an understanding of the instructions.   The patient was advised to call back or seek an in-person evaluation if the symptoms worsen or if the condition fails to improve as anticipated.  I provided 25 minutes of non-face-to-face time during this encounter.   [name], LCSW

## 2019-05-17 ENCOUNTER — Other Ambulatory Visit: Payer: Self-pay

## 2019-05-17 ENCOUNTER — Ambulatory Visit (INDEPENDENT_AMBULATORY_CARE_PROVIDER_SITE_OTHER): Payer: Medicaid Other | Admitting: Family Medicine

## 2019-05-17 ENCOUNTER — Ambulatory Visit: Payer: Medicaid Other | Admitting: Family Medicine

## 2019-05-17 ENCOUNTER — Encounter: Payer: Self-pay | Admitting: Family Medicine

## 2019-05-17 DIAGNOSIS — K589 Irritable bowel syndrome without diarrhea: Secondary | ICD-10-CM

## 2019-05-17 DIAGNOSIS — K625 Hemorrhage of anus and rectum: Secondary | ICD-10-CM

## 2019-05-17 MED ORDER — DOCUSATE SODIUM 50 MG/5ML PO LIQD: 50.0000 mg | Freq: Every day | ORAL | 3 refills | Status: DC | PRN

## 2019-05-17 NOTE — Assessment & Plan Note (Signed)
Worsened.  See after visit summary for plan

## 2019-05-17 NOTE — Patient Instructions (Addendum)
Good to see you today!  Thanks for coming in.  For the IBS - Use docusate (stool softner) as needed to keep stools soft - usually every other day - Metamucil (Psyllium) - one tablespoon in liquid daily   If the bleeding is not gone or if the abdomen pain is getting worse then come   Make an appointment with your PCP in one month  I will call you if your tests are not good.  Otherwise I will send you a message.  If you do not hear from me with in 2 weeks please call our office.

## 2019-05-17 NOTE — Progress Notes (Signed)
Subjective  Natalie Wagner is a 23 y.o. female is presenting with the following  ABDOMEN PAIN Primarily in the LUQ but often all over.  Comes and goes.  She feels this is her IBS that she has had for years  Has been having scant amount of rectal bleeding on off for the last week when wipes after hard bowel movement .   Her bowel movements range from hard to soft runny.  She takes Levsin and miralax intermittently.  No nausea and vomiting or lightheadness or chest pain or shortness of breath or fever. Is eating more than normal since home more with covid crisis and gaining weight   IUD Has had in for 3-4 years.  MIrena placed for periods.  Does not need contraception.  Is considering removing wondering if worsening her abdomen pain   Chief Complaint noted Review of Symptoms - see HPI PMH - Smoking status noted.   Has been seen at [facility] once in 2018 for IBS.  Had extensive work up at [facility] years ago  Objective Vital Signs reviewed BP 106/72   Pulse 92   Wt 193 lb 6.4 oz (87.7 kg)   SpO2 99%   BMI 39.73 kg/m  Heart - Regular rate and rhythm.  No murmurs, gallops or rubs.    Lungs:  Normal respiratory effort, chest expands symmetrically. Lungs are clear to auscultation, no crackles or wheezes. No CVAT Abdomen - mild LUQ tenderness without guarding or rebound.  No masses felt  Extremities:  No cyanosis, edema, or deformity noted with good range of motion of all major joints.    Assessments/Plans  See after visit summary for details of patient instuctions  Rectal bleeding With abdomen pain that is very consistent with IBS.  No signs for focal GI pathology.  Given age and has endoscopy in the past will treat IBS and hard stools with stool softner and fiber instead of any work up for bleeding or pain at this time.  Will check cbc for possible anemia   Irritable bowel syndrome Worsened.  See after visit summary for plan

## 2019-05-17 NOTE — Assessment & Plan Note (Signed)
With abdomen pain that is very consistent with IBS.  No signs for focal GI pathology.  Given age and has endoscopy in the past will treat IBS and hard stools with stool softner and fiber instead of any work up for bleeding or pain at this time.  Will check cbc for possible anemia

## 2019-05-18 ENCOUNTER — Encounter: Payer: Self-pay | Admitting: Family Medicine

## 2019-05-18 LAB — CBC
Hematocrit: 37.1 % (ref 34.0–46.6)
Hemoglobin: 12 g/dL (ref 11.1–15.9)
MCH: 29.3 pg (ref 26.6–33.0)
MCHC: 32.3 g/dL (ref 31.5–35.7)
MCV: 91 fL (ref 79–97)
Platelets: 333 10*3/uL (ref 150–450)
RBC: 4.1 x10E6/uL (ref 3.77–5.28)
RDW: 12.6 % (ref 11.7–15.4)
WBC: 8.2 10*3/uL (ref 3.4–10.8)

## 2019-05-19 MED ORDER — POLYETHYLENE GLYCOL 3350 17 GM/SCOOP PO POWD: ORAL | 3 refills | Status: DC

## 2019-05-22 ENCOUNTER — Ambulatory Visit: Payer: Medicaid Other | Admitting: Family Medicine

## 2019-05-24 ENCOUNTER — Ambulatory Visit (INDEPENDENT_AMBULATORY_CARE_PROVIDER_SITE_OTHER): Payer: Medicaid Other | Admitting: Licensed Clinical Social Worker

## 2019-05-24 ENCOUNTER — Other Ambulatory Visit: Payer: Self-pay

## 2019-05-24 ENCOUNTER — Encounter (HOSPITAL_COMMUNITY): Payer: Self-pay | Admitting: Licensed Clinical Social Worker

## 2019-05-24 DIAGNOSIS — F201 Disorganized schizophrenia: Secondary | ICD-10-CM | POA: Diagnosis not present

## 2019-05-24 NOTE — Progress Notes (Signed)
Virtual Visit via Telephone Note  I connected with Natalie Wagner on 05/24/19 at 10:00 AM EDT by telephone and verified that I am speaking with the correct person using two identifiers.    I discussed the limitations, risks, security and privacy concerns of performing an evaluation and management service by telephone and the availability of in person appointments. I also discussed with the patient that there may be a patient responsible charge related to this service. The patient expressed understanding and agreed to proceed.  Type of Therapy: Individual Therapy  Treatment Goals addressed: "I want to work on my anxiety, depression, self-confidence, and my ability to socialize"  Interventions: Motivational Interviewing, CBT, Grounding & Mindfulness Techniques, psychoeducation  Summary: Natalie Wagner is a 23 yo. female who presents with Schizophrenia, disorganized type  Suicidal/Homicidal: No -without intent/plan  Therapist Response:  Natalie Wagner met with clinician for an individual session. Natalie Wagner discussed her psychiatric symptoms, her current life events and her homework. Natalie Wagner shared that she has been feeling a little more depressed and anxious lately. Clinician explored triggers and noted that not much has changed in daily life. They got a puppy, which scares Natalie Wagner because he is teething and has bitten her. However, she reports she is working with him to get him trained and to make sure he chews on toys and not on her. Clinician identified the positive outcomes of having a dog to provide support, comfort, company, and protection. Natalie Wagner reports she has been working on her Copy and doing more reading lately. Clinician encouraged Natalie Wagner to keep her thoughts positive and to make sure that her reading materials are not too stressful for her. Clinician utilized CBT to review positive coping skills.   Plan: Return again in 4 weeks  Diagnosis: Axis I:  Schizophrenia, disorganized type    I discussed the assessment and treatment plan with the patient. The patient was provided an opportunity to ask questions and all were answered. The patient agreed with the plan and demonstrated an understanding of the instructions.   The patient was advised to call back or seek an in-person evaluation if the symptoms worsen or if the condition fails to improve as anticipated.  I provided 30 minutes of non-face-to-face time during this encounter.   [name], LCSW

## 2019-06-08 ENCOUNTER — Other Ambulatory Visit: Payer: Self-pay

## 2019-06-08 ENCOUNTER — Ambulatory Visit (INDEPENDENT_AMBULATORY_CARE_PROVIDER_SITE_OTHER): Payer: Medicaid Other | Admitting: Psychiatry

## 2019-06-08 ENCOUNTER — Other Ambulatory Visit (HOSPITAL_COMMUNITY): Payer: Self-pay

## 2019-06-08 ENCOUNTER — Encounter (HOSPITAL_COMMUNITY): Payer: Self-pay | Admitting: Psychiatry

## 2019-06-08 DIAGNOSIS — G4700 Insomnia, unspecified: Secondary | ICD-10-CM

## 2019-06-08 DIAGNOSIS — F913 Oppositional defiant disorder: Secondary | ICD-10-CM | POA: Diagnosis not present

## 2019-06-08 DIAGNOSIS — Z79899 Other long term (current) drug therapy: Secondary | ICD-10-CM

## 2019-06-08 DIAGNOSIS — F203 Undifferentiated schizophrenia: Secondary | ICD-10-CM

## 2019-06-08 DIAGNOSIS — F201 Disorganized schizophrenia: Secondary | ICD-10-CM

## 2019-06-08 MED ORDER — OLANZAPINE 20 MG PO TABS: 20.0000 mg | ORAL_TABLET | Freq: Every day | ORAL | 0 refills | Status: DC

## 2019-06-08 MED ORDER — BENZTROPINE MESYLATE 1 MG PO TABS: 1.0000 mg | ORAL_TABLET | Freq: Two times a day (BID) | ORAL | 0 refills | Status: DC

## 2019-06-08 NOTE — Progress Notes (Signed)
Virtual Visit via Telephone Note  I connected with Natalie Wagner on 06/08/19 at 11:30 AM EDT by telephone and verified that I am speaking with the correct person using two identifiers.  Location: Patient: home Provider: office   I discussed the limitations, risks, security and privacy concerns of performing an evaluation and management service by telephone and the availability of in person appointments. I also discussed with the patient that there may be a patient responsible charge related to this service. The patient expressed understanding and agreed to proceed.   History of Present Illness: "Pretty good". Pt has been trying to get out the house more. Pt states she is not watching as much tv. She is working on trying to make a better routine. She is not exercising. She has not made any diet changes. Depression is better but she remains unmotivated and tired. She knows it is because of COVID. She is also having significant anxiety. She is working on homework and trying to sue coping skills from therapy ut it doesn't  help much. Pt denies any AVH and paranoia. She has been more consistent with her meds. Pt is most concerned about her weight gain. Sleep is good and she is not taking Melatonin.  Observations/Objective: I spoke with Natalie Wagner on the phone.  Pt was calm, pleasant and cooperative.  Pt was engaged in the conversation and answered questions appropriately.  Speech was clear and coherent with normal rate, tone and volume.  Mood is mildly depressed and anxious, affect is congruent. Thought processes are coherent and concrete.  Thought content is logical.  Pt denies SI/HI.   Pt denies auditory and visual hallucinations and did not appear to be responding to internal stimuli.  Memory and concentration are good.  Fund of knowledge and use of language are average.  Insight and judgment are fair.  I am unable to comment on psychomotor activity, general appearance, hygiene, or eye  contact as I was unable to physically see the patient on the phone.  Vital signs not available since interview conducted virtually.    Assessment and Plan: Schizophrenia- undifferentiated type; ODD; Insomnia; r/o eating d/o  Continue Zyprexa 20mg  po qHS Cogentin 1mg  po BID   Labs: ordered CBC, CMP, HbA1c, Lipid panel, TSH, Prolactin level   Follow Up Instructions: In 2 months or sooner if needed   I discussed the assessment and treatment plan with the patient. The patient was provided an opportunity to ask questions and all were answered. The patient agreed with the plan and demonstrated an understanding of the instructions.   The patient was advised to call back or seek an in-person evaluation if the symptoms worsen or if the condition fails to improve as anticipated.  I provided 15 minutes of non-face-to-face time during this encounter.   [name], MD

## 2019-06-09 LAB — CBC WITH DIFFERENTIAL/PLATELET
Basophils Absolute: 0 10*3/uL (ref 0.0–0.2)
Basos: 0 %
EOS (ABSOLUTE): 0 10*3/uL (ref 0.0–0.4)
Eos: 0 %
Hematocrit: 34 % (ref 34.0–46.6)
Hemoglobin: 11 g/dL — ABNORMAL LOW (ref 11.1–15.9)
Immature Grans (Abs): 0 10*3/uL (ref 0.0–0.1)
Immature Granulocytes: 0 %
Lymphocytes Absolute: 2.4 10*3/uL (ref 0.7–3.1)
Lymphs: 25 %
MCH: 29 pg (ref 26.6–33.0)
MCHC: 32.4 g/dL (ref 31.5–35.7)
MCV: 90 fL (ref 79–97)
Monocytes Absolute: 0.5 10*3/uL (ref 0.1–0.9)
Monocytes: 5 %
Neutrophils Absolute: 6.6 10*3/uL (ref 1.4–7.0)
Neutrophils: 70 %
Platelets: 384 10*3/uL (ref 150–450)
RBC: 3.79 x10E6/uL (ref 3.77–5.28)
RDW: 12.8 % (ref 11.7–15.4)
WBC: 9.5 10*3/uL (ref 3.4–10.8)

## 2019-06-09 LAB — COMPREHENSIVE METABOLIC PANEL
ALT: 19 IU/L (ref 0–32)
AST: 21 IU/L (ref 0–40)
Albumin/Globulin Ratio: 1.8 (ref 1.2–2.2)
Albumin: 4.2 g/dL (ref 3.9–5.0)
Alkaline Phosphatase: 86 IU/L (ref 39–117)
BUN/Creatinine Ratio: 11 (ref 9–23)
BUN: 11 mg/dL (ref 6–20)
Bilirubin Total: 0.3 mg/dL (ref 0.0–1.2)
CO2: 20 mmol/L (ref 20–29)
Calcium: 9 mg/dL (ref 8.7–10.2)
Chloride: 104 mmol/L (ref 96–106)
Creatinine, Ser: 1.03 mg/dL — ABNORMAL HIGH (ref 0.57–1.00)
GFR calc Af Amer: 89 mL/min/{1.73_m2} (ref >59)
GFR calc non Af Amer: 77 mL/min/{1.73_m2} (ref >59)
Globulin, Total: 2.3 g/dL (ref 1.5–4.5)
Glucose: 102 mg/dL — ABNORMAL HIGH (ref 65–99)
Potassium: 4.7 mmol/L (ref 3.5–5.2)
Sodium: 141 mmol/L (ref 134–144)
Total Protein: 6.5 g/dL (ref 6.0–8.5)

## 2019-06-09 LAB — LIPID PANEL WITH LDL/HDL RATIO
Cholesterol, Total: 191 mg/dL (ref 100–199)
HDL: 36 mg/dL — ABNORMAL LOW (ref >39)
LDL Calculated: 134 mg/dL — ABNORMAL HIGH (ref 0–99)
LDl/HDL Ratio: 3.7 ratio — ABNORMAL HIGH (ref 0.0–3.2)
Triglycerides: 104 mg/dL (ref 0–149)
VLDL Cholesterol Cal: 21 mg/dL (ref 5–40)

## 2019-06-09 LAB — TSH: TSH: 2.31 u[IU]/mL (ref 0.450–4.500)

## 2019-06-09 LAB — HEMOGLOBIN A1C
Est. average glucose Bld gHb Est-mCnc: 117 mg/dL
Hgb A1c MFr Bld: 5.7 % — ABNORMAL HIGH (ref 4.8–5.6)

## 2019-06-09 LAB — PROLACTIN: Prolactin: 49.3 ng/mL — ABNORMAL HIGH (ref 4.8–23.3)

## 2019-06-21 ENCOUNTER — Encounter (HOSPITAL_COMMUNITY): Payer: Self-pay | Admitting: Licensed Clinical Social Worker

## 2019-06-21 ENCOUNTER — Other Ambulatory Visit: Payer: Self-pay

## 2019-06-21 ENCOUNTER — Ambulatory Visit (INDEPENDENT_AMBULATORY_CARE_PROVIDER_SITE_OTHER): Payer: Medicaid Other | Admitting: Licensed Clinical Social Worker

## 2019-06-21 DIAGNOSIS — F201 Disorganized schizophrenia: Secondary | ICD-10-CM

## 2019-06-21 NOTE — Progress Notes (Signed)
Virtual Visit via Telephone Note  I connected with Natalie Wagner on 06/21/19 at 10:00 AM EDT by telephone and verified that I am speaking with the correct person using two identifiers.     I discussed the limitations, risks, security and privacy concerns of performing an evaluation and management service by telephone and the availability of in person appointments. I also discussed with the patient that there may be a patient responsible charge related to this service. The patient expressed understanding and agreed to proceed.  Type of Therapy: Individual Therapy  Treatment Goals addressed: "I want to work on my anxiety, depression, self-confidence, and my ability to socialize"  Interventions: Motivational Interviewing, CBT, Grounding & Mindfulness Techniques, psychoeducation  Summary: Natalie Wagner is a 23 yo. female who presents with Schizophrenia, disorganized type  Suicidal/Homicidal: No -without intent/plan  Therapist Response:  Natalie Wagner met with clinician for an individual session. Natalie Wagner discussed her psychiatric symptoms, her current life events and her homework. Natalie Wagner shared that things have been stable. She has been mostly home spending time with her family, watching tv, reading a little, and cooking. She reports she has been coloring sometimes and listening to meditation music, which soothes her. Clinician explored anxiety and depression levels and noted some improvement with little work or attention. Natalie Wagner reports feeling some excitement for her brothers who are going off to college this week. She also reports thoughts of going to school at [employer], but no action as yet.  Clinician discussed ways to incorporate exercise in daily routine for assistance with mental health and physical health. Clinician also discussed the value of using her art in channeling energy, so as not to sit in front of the tv all day long.   Plan: Return again in 4 weeks  Diagnosis: Axis I:  Schizophrenia, disorganized type  I discussed the assessment and treatment plan with the patient. The patient was provided an opportunity to ask questions and all were answered. The patient agreed with the plan and demonstrated an understanding of the instructions.   The patient was advised to call back or seek an in-person evaluation if the symptoms worsen or if the condition fails to improve as anticipated.  I provided 40 minutes of non-face-to-face time during this encounter.   [name], LCSW

## 2019-07-20 ENCOUNTER — Ambulatory Visit (INDEPENDENT_AMBULATORY_CARE_PROVIDER_SITE_OTHER): Payer: Medicaid Other | Admitting: Licensed Clinical Social Worker

## 2019-07-20 ENCOUNTER — Encounter (HOSPITAL_COMMUNITY): Payer: Self-pay | Admitting: Licensed Clinical Social Worker

## 2019-07-20 ENCOUNTER — Other Ambulatory Visit: Payer: Self-pay

## 2019-07-20 DIAGNOSIS — F201 Disorganized schizophrenia: Secondary | ICD-10-CM | POA: Diagnosis not present

## 2019-07-20 NOTE — Progress Notes (Signed)
Virtual Visit via Telephone Note  I connected with Natalie Wagner on 07/20/19 at 10:00 AM EDT by telephone and verified that I am speaking with the correct person using two identifiers.     I discussed the limitations, risks, security and privacy concerns of performing an evaluation and management service by telephone and the availability of in person appointments. I also discussed with the patient that there may be a patient responsible charge related to this service. The patient expressed understanding and agreed to proceed.  Type of Therapy: Individual Therapy  Treatment Goals addressed: "I want to work on my anxiety, depression, self-confidence, and my ability to socialize"  Interventions: Motivational Interviewing, CBT, Grounding & Mindfulness Techniques, psychoeducation  Summary: Natalie Wagner is a 23 yo. female who presents with Schizophrenia, disorganized type  Suicidal/Homicidal: No -without intent/plan  Therapist Response:  Yianna met with clinician for an individual session. Natalie Wagner discussed her psychiatric symptoms, her current life events and her homework. Natalie Wagner shared that she is doing pretty well. No concerns about depression or anxiety. She reports she does not go out much and spends most of her time watching tv. Clinician explored options for exercising, taking walks, or even sitting outdoors to get some fresh air. Natalie Wagner denied much interest in going anywhere on her own and noted that due to mom's work schedule she has not been out much at all. Clinician discussed use of exercise videos online or simply doing situps while watching tv.    Plan: Return again in 4 weeks  Diagnosis: Axis I: Schizophrenia, disorganized type    I discussed the assessment and treatment plan with the patient. The patient was provided an opportunity to ask questions and all were answered. The patient agreed with the plan and demonstrated an understanding of the instructions.   The  patient was advised to call back or seek an in-person evaluation if the symptoms worsen or if the condition fails to improve as anticipated.  I provided 25 minutes of non-face-to-face time during this encounter.   [name], LCSW

## 2019-08-03 ENCOUNTER — Other Ambulatory Visit: Payer: Self-pay

## 2019-08-03 ENCOUNTER — Encounter (HOSPITAL_COMMUNITY): Payer: Self-pay | Admitting: Psychiatry

## 2019-08-03 ENCOUNTER — Ambulatory Visit (INDEPENDENT_AMBULATORY_CARE_PROVIDER_SITE_OTHER): Payer: Medicaid Other | Admitting: Psychiatry

## 2019-08-03 DIAGNOSIS — F201 Disorganized schizophrenia: Secondary | ICD-10-CM

## 2019-08-03 MED ORDER — OLANZAPINE 20 MG PO TABS: 20.0000 mg | ORAL_TABLET | Freq: Every day | ORAL | 0 refills | Status: DC

## 2019-08-03 MED ORDER — BENZTROPINE MESYLATE 1 MG PO TABS: 1.0000 mg | ORAL_TABLET | Freq: Two times a day (BID) | ORAL | 0 refills | Status: DC

## 2019-08-03 NOTE — Progress Notes (Signed)
Virtual Visit via Telephone Note  I connected with Natalie Wagner  on 08/03/19 at 10:30 AM EDT by telephone and verified that I am speaking with the correct person using two identifiers.  Location: Patient: home Provider: office   I discussed the limitations, risks, security and privacy concerns of performing an evaluation and management service by telephone and the availability of in person appointments. I also discussed with the patient that there may be a patient responsible charge related to this service. The patient expressed understanding and agreed to proceed.   History of Present Illness: Natalie Wagner states Natalie Wagner is doing well and she has no concerns at this time.  Natalie Wagner reports her depression is level and she is trying to stay positive. She still thinks about the past. She doesn't have much interest in going out. She does out with family. At home she will do some chores or watch tv.  Her anxiety remain high. During the day she has racing thoughts, SOB and fast heart rate. She will then take some deep breaths and wait. It gets better.  Natalie Wagner is working on her therapy homework and trying to use coping skills. It does help. She is sleeping better now that her tv broke and she is no longer napping during the day. Natalie Wagner denies SI/HI. Natalie Wagner tries to keep busy and it is helping to keep her from eating much. She is trying to eat healthier stuff. She has not been motivated to exercise. Natalie Wagner has gained weight despite her efforts. Natalie Wagner denies AVH and paranoia.     Observations/Objective: I spoke with Natalie Wagner on the phone.  Natalie Wagner was calm, pleasant and cooperative.  Natalie Wagner was engaged in the conversation and answered questions appropriately.  Speech was clear and coherent with normal rate, tone and volume.  Mood is depressed and anxious, affect is congruent. Thought processes are coherent, goal oriented and intact.  Thought content is logical.  Natalie Wagner denies SI/HI.   Natalie Wagner denies auditory and visual hallucinations and  did not appear to be responding to internal stimuli.  Memory and concentration are good.  Fund of knowledge and use of language are average.  Insight and judgment are fair.  I am unable to comment on psychomotor activity, general appearance, hygiene, or eye contact as I was unable to physically see the patient on the phone.  Vital signs not available since interview conducted virtually.     Assessment and Plan: Schizophrenia undifferentiate type; ODD; Insomnia; r/o eating d/o  Status of current symptoms: improvement  Zyprexa 20mg  po qHS Cogentin 1mg  po BID  Reviewed labs- 06/08/2019 Recommended diet and exercise and f/up with PCP Prolactin elevated- Natalie Wagner notes she has been having clear discharge from her breasts have enlarged and she has random breast tenderness. When tender she will squeeze her breast and have some mild d/c. She says this happens about once month. Natalie Wagner does not want to change antipsychotics or try bromocriptine. She does not want to follow up with gynecologist. She wants to monitor for now.   Follow Up Instructions: In 12 weeks or sooner if needed   I discussed the assessment and treatment plan with the patient. The patient was provided an opportunity to ask questions and all were answered. The patient agreed with the plan and demonstrated an understanding of the instructions.   The patient was advised to call back or seek an in-person evaluation if the symptoms worsen or if the condition fails to improve as anticipated.  I provided 30 minutes  of non-face-to-face time during this encounter.   [name], MD

## 2019-08-17 ENCOUNTER — Ambulatory Visit (INDEPENDENT_AMBULATORY_CARE_PROVIDER_SITE_OTHER): Payer: Medicaid Other | Admitting: Family Medicine

## 2019-08-17 ENCOUNTER — Telehealth (HOSPITAL_COMMUNITY): Payer: Self-pay | Admitting: Licensed Clinical Social Worker

## 2019-08-17 ENCOUNTER — Encounter: Payer: Self-pay | Admitting: Family Medicine

## 2019-08-17 ENCOUNTER — Ambulatory Visit (HOSPITAL_COMMUNITY): Payer: Medicaid Other | Admitting: Licensed Clinical Social Worker

## 2019-08-17 ENCOUNTER — Other Ambulatory Visit: Payer: Self-pay

## 2019-08-17 VITALS — BP 122/60 | HR 110 | Ht 59.0 in | Wt 192.0 lb

## 2019-08-17 DIAGNOSIS — Z23 Encounter for immunization: Secondary | ICD-10-CM | POA: Diagnosis not present

## 2019-08-17 DIAGNOSIS — K589 Irritable bowel syndrome without diarrhea: Secondary | ICD-10-CM | POA: Diagnosis not present

## 2019-08-17 DIAGNOSIS — F203 Undifferentiated schizophrenia: Secondary | ICD-10-CM

## 2019-08-17 MED ORDER — POLYETHYLENE GLYCOL 3350 17 GM/SCOOP PO POWD: 17.0000 g | Freq: Two times a day (BID) | ORAL | 1 refills | Status: DC | PRN

## 2019-08-17 NOTE — Progress Notes (Signed)
  Subjective:  Patient ID: Natalie Wagner  DOB: 1996/09/16 MRN: [id number]  [name] is a 23 y.o. female with a PMH of asthma, OSA, IBS, PCOS, lactose intolerance, HLD, schizophrenia, here today for IBS with constipation.   HPI:  IBS: - *Patient reports that she was previously diagnosed with IBS.  She reports that she is more of a constipation type.  She currently denies any blood in her stool, melena or diarrhea.  Patient states that she was previously seen by a GI doctor but when her insurance changed she no longer followed with them. -Patient states that She has not been taking any medication at home.  She states she previously took [place] but she has not had any of this in a while.  She did not try any of the docusate that was previously given to her.  Patient states that she cannot swallow pills  -she denies any abdominal pain, fever, chills, nausea, vomiting  ROS: As mentioned in HPI  Social hx: Denies use of illicit drugs, alcohol use Smoking status reviewed  Patient Active Problem List   Diagnosis Date Noted  . Sleep apnea 02/10/2018  . Obesity 03/02/2017  . Mild intermittent asthma, uncomplicated [id number]  . Tension-type headache, not intractable 10/21/2016  . Convulsions ([place]) 09/03/2015  . Undifferentiated schizophrenia ([place]) 09/03/2015  . Hyperlipidemia LDL goal <130 05/23/2014  . Lactose intolerance documented with breath testing 10/25/2013  . Irritable bowel syndrome 05/23/2013  . 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 Allergic rhinitis 05/05/2013  . PCOS (polycystic ovarian syndrome) 05/05/2013     Objective:  BP 122/60   Pulse (!) 110   Ht 4\' 11"  (1.499 m)   Wt 192 lb (87.1 kg)   LMP 08/14/2019 (Approximate) Comment: spotting occasional with IUD  SpO2 97%   BMI 38.78 kg/m   Vitals and nursing note reviewed  General: NAD, pleasant Pulm: normal effort Extremities: no edema or cyanosis. WWP. Skin: warm and dry, no rashes noted Neuro: alert and  oriented, no focal deficits Psych: normal affect, normal thought content  Assessment & Plan:   Irritable bowel syndrome Patient previously seen by GI for her IBS, overall it is improved.  She states she is just having some trouble with constipation but she has not been trying any medications.  Counseled patient on importance of increasing fiber in her diet and given handout on high-fiber foods.  We will also treat with MiraLAX that she may use as often as she likes.  Counseled that she could also try the docusate in the meantime for her current constipation.  She is denying any blood in her stool or abdominal pain that she has previously complained of.  She is requesting that she have a referral placed to GI, did counsel her that this may not be necessary but given her prior need for multiple visits with GI do not think this is unreasonable.  Will place consult as nonemergent.   Natalie Zoiee Wimmer, DO Family Medicine Resident PGY-3

## 2019-08-17 NOTE — Patient Instructions (Signed)
Thank you for coming to see me today. It was a pleasure! Today we talked about:   Increase the amount of fiber in diet with gummies or cereal (all-bran) or flax seed.  You may use miralax daily to prevent constipation.  You may use the colace to help you now to have a bowel movement.   Please follow-up as needed.  If you have any questions or concerns, please do not hesitate to call the office at [phone].  Take Care,   [place] Min Collymore, DO  High-Fiber Diet Fiber, also called dietary fiber, is a type of carbohydrate that is found in fruits, vegetables, whole grains, and beans. A high-fiber diet can have many health benefits. Your health care provider may recommend a high-fiber diet to help:  Prevent constipation. Fiber can make your bowel movements more regular.  Lower your cholesterol.  Relieve the following conditions: ? Swelling of veins in the anus (hemorrhoids). ? Swelling and irritation (inflammation) of specific areas of the digestive tract (uncomplicated diverticulosis). ? A problem of the large intestine (colon) that sometimes causes pain and diarrhea (irritable bowel syndrome, IBS).  Prevent overeating as part of a weight-loss plan.  Prevent heart disease, type 2 diabetes, and certain cancers. What is my plan? The recommended daily fiber intake in grams (g) includes:  38 g for men age 35 or younger.  30 g for men over age 82.  21 g for women age 36 or younger.  21 g for women over age 57. You can get the recommended daily intake of dietary fiber by:  Eating a variety of fruits, vegetables, grains, and beans.  Taking a fiber supplement, if it is not possible to get enough fiber through your diet. What do I need to know about a high-fiber diet?  It is better to get fiber through food sources rather than from fiber supplements. There is not a lot of research about how effective supplements are.  Always check the fiber content on the nutrition facts label of  any prepackaged food. Look for foods that contain 5 g of fiber or more per serving.  Talk with a diet and nutrition specialist (dietitian) if you have questions about specific foods that are recommended or not recommended for your medical condition, especially if those foods are not listed below.  Gradually increase how much fiber you consume. If you increase your intake of dietary fiber too quickly, you may have bloating, cramping, or gas.  Drink plenty of water. Water helps you to digest fiber. What are tips for following this plan?  Eat a wide variety of high-fiber foods.  Make sure that half of the grains that you eat each day are whole grains.  Eat breads and cereals that are made with whole-grain flour instead of refined flour or white flour.  Eat brown rice, bulgur wheat, or millet instead of white rice.  Start the day with a breakfast that is high in fiber, such as a cereal that contains 5 g of fiber or more per serving.  Use beans in place of meat in soups, salads, and pasta dishes.  Eat high-fiber snacks, such as berries, raw vegetables, nuts, and popcorn.  Choose whole fruits and vegetables instead of processed forms like juice or sauce. What foods can I eat?  Fruits Berries. Pears. Apples. Oranges. Avocado. Prunes and raisins. Dried figs. Vegetables Sweet potatoes. Spinach. Kale. Artichokes. Cabbage. Broccoli. Cauliflower. Green peas. Carrots. Squash. Grains Whole-grain breads. Multigrain cereal. Oats and oatmeal. Brown rice. Barley. Bulgur  wheat. Millet. Quinoa. Bran muffins. Popcorn. Rye wafer crackers. Meats and other proteins Navy, kidney, and pinto beans. Soybeans. Split peas. Lentils. Nuts and seeds. Dairy Fiber-fortified yogurt. Beverages Fiber-fortified soy milk. Fiber-fortified orange juice. Other foods Fiber bars. The items listed above may not be a complete list of recommended foods and beverages. Contact a dietitian for more options. What foods are not  recommended? Fruits Fruit juice. Cooked, strained fruit. Vegetables Fried potatoes. Canned vegetables. Well-cooked vegetables. Grains White bread. Pasta made with refined flour. White rice. Meats and other proteins Fatty cuts of meat. Fried chicken or fried fish. Dairy Milk. Yogurt. Cream cheese. Sour cream. Fats and oils Butters. Beverages Soft drinks. Other foods Cakes and pastries. The items listed above may not be a complete list of foods and beverages to avoid. Contact a dietitian for more information. Summary  Fiber is a type of carbohydrate. It is found in fruits, vegetables, whole grains, and beans.  There are many health benefits of eating a high-fiber diet, such as preventing constipation, lowering blood cholesterol, helping with weight loss, and reducing your risk of heart disease, diabetes, and certain cancers.  Gradually increase your intake of fiber. Increasing too fast can result in cramping, bloating, and gas. Drink plenty of water while you increase your fiber.  The best sources of fiber include whole fruits and vegetables, whole grains, nuts, seeds, and beans. This information is not intended to replace advice given to you by your health care provider. Make sure you discuss any questions you have with your health care provider. Document Released: 10/26/2005 Document Revised: 08/30/2017 Document Reviewed: 08/30/2017 Elsevier Patient Education  2020 [employer].

## 2019-08-17 NOTE — Telephone Encounter (Signed)
Clinician called Aina on her cell phone, which is how we have been communicating for monthly sessions. Clinician left 2 messages at 10:03am and 10:15am. No response, no answer. This will be considered a no show.

## 2019-08-18 ENCOUNTER — Encounter: Payer: Self-pay | Admitting: Family Medicine

## 2019-08-18 NOTE — Assessment & Plan Note (Signed)
Patient previously seen by GI for her IBS, overall it is improved.  She states she is just having some trouble with constipation but she has not been trying any medications.  Counseled patient on importance of increasing fiber in her diet and given handout on high-fiber foods.  We will also treat with MiraLAX that she may use as often as she likes.  Counseled that she could also try the docusate in the meantime for her current constipation.  She is denying any blood in her stool or abdominal pain that she has previously complained of.  She is requesting that she have a referral placed to GI, did counsel her that this may not be necessary but given her prior need for multiple visits with GI do not think this is unreasonable.  Will place consult as nonemergent.

## 2019-08-18 NOTE — Addendum Note (Signed)
Addended by: Sayana Salley, [place] J on: 08/18/2019 11:17 AM   Modules accepted: Orders

## 2019-08-22 ENCOUNTER — Encounter: Payer: Self-pay | Admitting: Family Medicine

## 2019-08-31 ENCOUNTER — Encounter: Payer: Self-pay | Admitting: Gastroenterology

## 2019-08-31 ENCOUNTER — Other Ambulatory Visit: Payer: Self-pay

## 2019-08-31 ENCOUNTER — Ambulatory Visit (INDEPENDENT_AMBULATORY_CARE_PROVIDER_SITE_OTHER): Payer: Medicaid Other | Admitting: Gastroenterology

## 2019-08-31 VITALS — BP 118/76 | HR 92 | Temp 99.5°F | Ht 59.0 in | Wt 189.0 lb

## 2019-08-31 DIAGNOSIS — K589 Irritable bowel syndrome without diarrhea: Secondary | ICD-10-CM | POA: Diagnosis not present

## 2019-08-31 DIAGNOSIS — E739 Lactose intolerance, unspecified: Secondary | ICD-10-CM

## 2019-08-31 MED ORDER — HYOSCYAMINE SULFATE 0.125 MG SL SUBL: 0.1250 mg | SUBLINGUAL_TABLET | SUBLINGUAL | 5 refills | Status: DC | PRN

## 2019-08-31 NOTE — Progress Notes (Signed)
08/31/2019 Natalie Wagner [id number] [birth year]   HISTORY OF PRESENT ILLNESS: This is a 23 year old female who is a patient of Dr. Woodward Ku.  She has a history of schizophrenia and irritable bowel syndrome as well as lactose intolerance.  She was previously followed by Dr. Lillia Mountain at pediatric gastroenterology at [facility].  She is accompanied by her mother.  She has had extensive GI evaluation including the following:  Colonoscopy in 2014 showed abnormal mucosa with lymphoid nodular hyperplasia.  She had normal ileum.  Random biopsies were obtained throughout the colon and showed focal lymphoid hyperplasia on most of those biopsies with some showing no diagnostic abnormalities.  EGD in 2014 at [facility] showed duodenitis, but was otherwise normal.  Small bowel and gastric biopsies were normal.  She also had a CT enterography was unremarkable and lactose breath test positive for lactose intolerance.  Her ultimate diagnosis once again was irritable bowel syndrome and lactose intolerance.  She is here today with complaints of constipation primarily, but occasional episodes of diarrhea.  Complains of abdominal cramping on both sides of her abdomen.  Uses Levsin as needed, which does seem to help with her abdominal pain.  Is asking for refill.  Has MiraLAX on her medication list, but does not use it regularly.  Complains of bloating after eating.   Past Medical History:  Diagnosis Date  . Asthma    prn inhaler  . Constipation 05/05/2013  . Difficulty swallowing pills   . Eating disorder 07/27/2016  . Elevated prolactin level 12/27/2014  . Galactorrhea 12/27/2014  . History of seizures as a child    mother states were triggered by migraines; no seizures in > 7 yr.  . 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 Migraines   . Schizophrenia ([place])   . Simple cyst of kidney 01/24/2016  . Sleep apnea    Past Surgical  History:  Procedure Laterality Date  . COLONOSCOPY    . MRI     under anesthesia  . 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 UMBILICAL HERNIA REPAIR      reports that she has never smoked. She has never used smokeless tobacco. She reports that she does not drink alcohol or use drugs. family history includes Anesthesia problems in her maternal grandfather; Asthma in her mother; Cancer in an other family member; Depression in her sister; Diabetes in her maternal grandfather; Heart disease in her maternal grandfather; Hypertension in her maternal grandfather, maternal grandmother, and maternal uncle; Kidney disease in her maternal grandfather; Stroke in her maternal grandmother. Allergies  Allergen Reactions  . Lactose Intolerance (Gi) Diarrhea      Outpatient Encounter Medications as of 08/31/2019  Medication Sig  . albuterol (PROAIR HFA) 108 (90 Base) MCG/ACT inhaler Inhale 2 puffs into the lungs every 4 (four) hours as needed for wheezing or shortness of breath.  . benztropine (COGENTIN) 1 MG tablet Take 1 tablet (1 mg total) by mouth 2 (two) times daily.  Marland Kitchen docusate (COLACE) 50 MG/5ML liquid Take 5 mLs (50 mg total) by mouth daily as needed for mild constipation.  . fluticasone (FLONASE) 50 MCG/ACT nasal spray Place 2 sprays into both nostrils 2 (two) times daily.  . hyoscyamine (LEVSIN SL) 0.125  MG SL tablet Place 1 tablet (0.125 mg total) under the tongue every 4 (four) hours as needed.  Marland Kitchen levonorgestrel (MIRENA) 20 MCG/24HR IUD 1 Intra Uterine Device (1 each total) by Intrauterine route once.  . Melatonin 5 MG TABS Take 1 tablet by mouth daily as needed (sleep).   . OLANZapine (ZYPREXA) 20 MG tablet Take 1 tablet (20 mg total) by mouth at bedtime.  . Olopatadine HCl (PAZEO) 0.7 % SOLN Place 1 mL into both eyes daily as  needed (allergies).   . polyethylene glycol powder (GLYCOLAX/MIRALAX) 17 GM/SCOOP powder Take 17 g by mouth 2 (two) times daily as needed. (Patient taking differently: Take 17 g by mouth daily. )  . RESTASIS 0.05 % ophthalmic emulsion 1 DROP TWICE A DAY INTO BOTH EYES   No facility-administered encounter medications on file as of 08/31/2019.      REVIEW OF SYSTEMS  : All other systems reviewed and negative except where noted in the History of Present Illness.   PHYSICAL EXAM: BP 118/76 (BP Location: Left Arm, Patient Position: Sitting, Cuff Size: Normal)   Pulse 92   Temp 99.5 F (37.5 C)   Ht 4\' 11"  (1.499 m) Comment: height measured without shoes  Wt 189 lb (85.7 kg)   LMP 08/14/2019 (Approximate) Comment: spotting occasional with IUD  BMI 38.17 kg/m  General: Well developed black female in no acute distress Head: Normocephalic and atraumatic Eyes:  Sclerae anicteric, conjunctiva pink. Ears: Normal auditory acuity Lungs: Clear throughout to auscultation; no increased WOB. Heart: Regular rate and rhythm; no M/R/G. Abdomen: Soft, non-distended.  BS present.  Non-tender. Musculoskeletal: Symmetrical with no gross deformities  Skin: No lesions on visible extremities Extremities: No edema  Neurological: Alert oriented x 4, grossly non-focal Psychological:  Alert and cooperative. Normal mood and affect  ASSESSMENT AND PLAN: 23 year old female with irritable bowel syndrome, constipation predominant with occasional episodes of diarrhea, and lactose intolerance.  She obviously needs to continue to avoid lactose.  I have advised her to begin using her MiraLAX daily and titrate it to twice daily if needed.  Also told her to consider instead of increasing MiraLAX to twice a day possibly adding in a daily powder fiber supplement such as Benefiber which would act as a bulking agent.  If she were going to do this I would recommend 2 tablespoons mixed in 8 ounces of liquid daily.  New  prescription for Levsin was sent.  We will follow up in about a month.  **Of note, she has difficulty swallowing pills.   CC:  Martyn Malay, MD

## 2019-08-31 NOTE — Patient Instructions (Addendum)
We have refilled your prescription for Levsin.   Use Miralax one capful daily, can increase to twice a day or add add powder fiber such as Benefiber or Citrucel.

## 2019-09-12 ENCOUNTER — Ambulatory Visit (INDEPENDENT_AMBULATORY_CARE_PROVIDER_SITE_OTHER): Payer: Medicaid Other | Admitting: Licensed Clinical Social Worker

## 2019-09-12 ENCOUNTER — Encounter (HOSPITAL_COMMUNITY): Payer: Self-pay | Admitting: Licensed Clinical Social Worker

## 2019-09-12 ENCOUNTER — Other Ambulatory Visit: Payer: Self-pay

## 2019-09-12 DIAGNOSIS — F201 Disorganized schizophrenia: Secondary | ICD-10-CM | POA: Diagnosis not present

## 2019-09-12 NOTE — Progress Notes (Signed)
Virtual Visit via Telephone Note  I connected with [name] on 09/12/19 at  1:30 PM EST by telephone and verified that I am speaking with the correct person using two identifiers.    I discussed the limitations, risks, security and privacy concerns of performing an evaluation and management service by telephone and the availability of in person appointments. I also discussed with the patient that there may be a patient responsible charge related to this service. The patient expressed understanding and agreed to proceed.  Type of Therapy: Individual Therapy  Treatment Goals addressed: "I want to work on my anxiety, depression, self-confidence, and my ability to socialize"  Interventions: Motivational Interviewing, CBT, Grounding & Mindfulness Techniques, psychoeducation  Summary: Margy Sumler is a 23 y.o. female who presents with Schizophrenia, disorganized type  Suicidal/Homicidal: No -without intent/plan  Therapist Response:  Mahogany met with clinician for an individual session. Cherryl discussed her psychiatric symptoms, her current life events and her homework. Leeza shared that she is doing pretty well. She reports excitement about upcoming move into a new house. Clinician discussed plans to move and adjustment to changes. Clinician processed ways for Christyanna to get used to her new home.  Clinician discussed mood and interactions with family. Juliahna reported all is going well at home. She reports limited interactions outside of her family. However, she reported that she has been getting out a little more often, which has been helpful with her mood. Clinician discussed other options for being more social.    Plan: Return again in 4 weeks  Diagnosis: Axis I: Schizophrenia, disorganized type    I discussed the assessment and treatment plan with the patient. The patient was provided an opportunity to ask questions and all were answered. The patient agreed  with the plan and demonstrated an understanding of the instructions.   The patient was advised to call back or seek an in-person evaluation if the symptoms worsen or if the condition fails to improve as anticipated.  I provided 20 minutes of non-face-to-face time during this encounter.   [name], LCSW

## 2019-09-14 ENCOUNTER — Encounter: Payer: Self-pay | Admitting: Gastroenterology

## 2019-09-29 ENCOUNTER — Ambulatory Visit: Payer: Medicaid Other | Admitting: Gastroenterology

## 2019-10-09 ENCOUNTER — Ambulatory Visit (INDEPENDENT_AMBULATORY_CARE_PROVIDER_SITE_OTHER): Payer: Medicaid Other | Admitting: Licensed Clinical Social Worker

## 2019-10-09 ENCOUNTER — Encounter (HOSPITAL_COMMUNITY): Payer: Self-pay | Admitting: Licensed Clinical Social Worker

## 2019-10-09 ENCOUNTER — Other Ambulatory Visit: Payer: Self-pay

## 2019-10-09 DIAGNOSIS — F201 Disorganized schizophrenia: Secondary | ICD-10-CM

## 2019-10-09 NOTE — Progress Notes (Signed)
Reviewed and agree with documentation and assessment and plan. K. Veena Nandigam , MD   

## 2019-10-09 NOTE — Progress Notes (Signed)
Virtual Visit via Telephone Note  I connected with Natalie Wagner on 10/09/19 at  1:30 PM EST by telephone and verified that I am speaking with the correct person using two identifiers.    I discussed the limitations, risks, security and privacy concerns of performing an evaluation and management service by telephone and the availability of in person appointments. I also discussed with the patient that there may be a patient responsible charge related to this service. The patient expressed understanding and agreed to proceed.  Type of Therapy: Individual Therapy  Treatment Goals addressed: "I want to work on my anxiety, depression, self-confidence, and my ability to socialize"  Interventions: Motivational Interviewing, CBT, Grounding & Mindfulness Techniques, psychoeducation  Summary: Natalie Wagner is a 23 y.o. female who presents with Schizophrenia, disorganized type  Suicidal/Homicidal: No -without intent/plan  Therapist Response:  Natalie Wagner met with clinician for an individual session. Natalie Wagner discussed her psychiatric symptoms, her current life events and her homework. Natalie Wagner sharedthat she is doing pretty well. She reports that she and her sister cooked Thanksgiving dinner while her mom worked last week and she felt proud that they did the whole thing. Clinician discussed relationships with family members, interactions, and feelings about socialization. Natalie Wagner reports her anxiety has been a little better. She has been taking Cogentin at night now and it seems to be going well. She reports interest in losing weight and exercising. Her brother led her and mom through some exercises yesterday and she reported feeling a little sore, but good.  Clinician encouraged Natalie Wagner to start a food journal and to write down everything she eats and drinks during the day, to keep track and make sure she is making healthier food choices.  Natalie Wagner reports she is starting to have some  tingling in her hands and swelling in her feet. She will communicate this concern to Dr. [name] at the next appointment. Clinician will also communicate this to Dr. [name].   Plan: Return again in3-4weeks  Diagnosis: Axis I: Schizophrenia, disorganized type    I discussed the assessment and treatment plan with the patient. The patient was provided an opportunity to ask questions and all were answered. The patient agreed with the plan and demonstrated an understanding of the instructions.   The patient was advised to call back or seek an in-person evaluation if the symptoms worsen or if the condition fails to improve as anticipated.  I provided 30 minutes of non-face-to-face time during this encounter.   [name], LCSW

## 2019-10-26 ENCOUNTER — Ambulatory Visit (INDEPENDENT_AMBULATORY_CARE_PROVIDER_SITE_OTHER): Payer: Medicaid Other | Admitting: Psychiatry

## 2019-10-26 ENCOUNTER — Other Ambulatory Visit: Payer: Self-pay

## 2019-10-26 DIAGNOSIS — Z5329 Procedure and treatment not carried out because of patient's decision for other reasons: Secondary | ICD-10-CM

## 2019-10-26 NOTE — Progress Notes (Signed)
I called the patient and there was no answer. I left a voice message.

## 2019-10-30 ENCOUNTER — Ambulatory Visit (INDEPENDENT_AMBULATORY_CARE_PROVIDER_SITE_OTHER): Payer: Medicaid Other | Admitting: Licensed Clinical Social Worker

## 2019-10-30 ENCOUNTER — Other Ambulatory Visit: Payer: Self-pay

## 2019-10-30 ENCOUNTER — Encounter (HOSPITAL_COMMUNITY): Payer: Self-pay | Admitting: Licensed Clinical Social Worker

## 2019-10-30 DIAGNOSIS — F201 Disorganized schizophrenia: Secondary | ICD-10-CM

## 2019-10-30 NOTE — Progress Notes (Signed)
Virtual Visit via Telephone Note  I connected with Natalie Wagner on 10/30/19 at 12:30 PM EST by telephone and verified that I am speaking with the correct person using two identifiers.    I discussed the limitations, risks, security and privacy concerns of performing an evaluation and management service by telephone and the availability of in person appointments. I also discussed with the patient that there may be a patient responsible charge related to this service. The patient expressed understanding and agreed to proceed.  Type of Therapy: Individual Therapy  Treatment Goals addressed: "I want to work on my anxiety, depression, self-confidence, and my ability to socialize"  Interventions: Motivational Interviewing, CBT, Grounding & Mindfulness Techniques, psychoeducation  Summary: Natalie Wagner is a 23y.o. female who presents with Schizophrenia, disorganized type  Suicidal/Homicidal: No -without intent/plan  Therapist Response:  Natalie Wagner met with clinician for an individual session. Natalie Wagner discussed her psychiatric symptoms, her current life events and her homework. Natalie Wagner sharedthat she is doing well. She reports she has been busy moving into the family's new home, getting the house unpacked and adjusting to her new setting. She reports she likes the house and it seems to be a nice place to live. Clinician discussed mood and interactions. Natalie Wagner reports she has been spending time with her family, cooking, setting things up for the holidays, etc. Clinician explored anxiety sxs and noted that things have been pretty well settled over the past month now that they have moved. Clinician provided additional anxiety exercises, including grounding techniques, deep breathing, and mindfulness videos on Youtube.   Plan: Return again in3-4weeks  Diagnosis: Axis I: Schizophrenia, disorganized type      I discussed the assessment and treatment plan with the patient. The  patient was provided an opportunity to ask questions and all were answered. The patient agreed with the plan and demonstrated an understanding of the instructions.   The patient was advised to call back or seek an in-person evaluation if the symptoms worsen or if the condition fails to improve as anticipated.  I provided 40 minutes of non-face-to-face time during this encounter.   [name], LCSW

## 2019-11-24 ENCOUNTER — Other Ambulatory Visit: Payer: Self-pay

## 2019-11-24 ENCOUNTER — Ambulatory Visit
Admission: EM | Admit: 2019-11-24 | Discharge: 2019-11-24 | Disposition: A | Discharge status: Home / Self Care | Payer: Medicaid Other | Attending: Emergency Medicine | Admitting: Emergency Medicine

## 2019-11-24 ENCOUNTER — Encounter: Payer: Self-pay | Admitting: Emergency Medicine

## 2019-11-24 DIAGNOSIS — J302 Other seasonal allergic rhinitis: Secondary | ICD-10-CM

## 2019-11-24 DIAGNOSIS — J069 Acute upper respiratory infection, unspecified: Secondary | ICD-10-CM

## 2019-11-24 DIAGNOSIS — Z20822 Contact with and (suspected) exposure to covid-19: Secondary | ICD-10-CM

## 2019-11-24 DIAGNOSIS — J4520 Mild intermittent asthma, uncomplicated: Secondary | ICD-10-CM

## 2019-11-24 MED ORDER — ALBUTEROL SULFATE HFA 108 (90 BASE) MCG/ACT IN AERS: 2.0000 | INHALATION_SPRAY | RESPIRATORY_TRACT | 0 refills | Status: DC | PRN

## 2019-11-24 MED ORDER — AEROCHAMBER PLUS FLO-VU MEDIUM MISC: 1.0000 | Freq: Once | 0 refills | Status: AC

## 2019-11-24 MED ORDER — CETIRIZINE HCL 10 MG PO TABS: 10.0000 mg | ORAL_TABLET | Freq: Every day | ORAL | 0 refills | Status: AC

## 2019-11-24 MED ORDER — FLUTICASONE PROPIONATE 50 MCG/ACT NA SUSP: 2.0000 | Freq: Two times a day (BID) | NASAL | 0 refills | Status: DC

## 2019-11-24 NOTE — ED Notes (Signed)
Patient able to ambulate independently  

## 2019-11-24 NOTE — ED Triage Notes (Signed)
Pt presents to [facility] for 3-4 days of cough, sneezing, chills.  States mother tested positive for COVID.

## 2019-11-24 NOTE — ED Provider Notes (Signed)
[facility]    CSN: [id number] Arrival date & time: 11/24/19  1552      History   Chief Complaint Chief Complaint  Patient presents with  . Exposure to COVID    HPI Natalie Wagner is a 24 y.o. female w/ h/o asthma, sleep apnea  Presenting for Covid testing: Exposure: mother Date of exposure: [place] Any fever, symptoms since exposure: yes - 4 day course of runny nose, dry cough, sneezing, chills.  Patient has been working on eating better and drinking more water.  Not taking allergy meds.  No CPAP for sleep apnea as "it's mild".  Does not currently have inhaler at home.    Past Medical History:  Diagnosis Date  . Asthma    prn inhaler  . Constipation 05/05/2013  . Difficulty swallowing pills   . Eating disorder 07/27/2016  . Elevated prolactin level 12/27/2014  . Galactorrhea 12/27/2014  . History of seizures as a child    mother states were triggered by migraines; no seizures in > 7 yr.  . 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 Migraines   . Schizophrenia ([place])   . Simple cyst of kidney 01/24/2016  . Sleep apnea     Patient Active Problem List   Diagnosis Date Noted  . Sleep apnea 02/10/2018  . Obesity 03/02/2017  . Mild intermittent asthma, uncomplicated [id number]  . Tension-type headache, not intractable 10/21/2016  . Convulsions ([place]) 09/03/2015  . 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 Hyperlipidemia LDL goal <130 05/23/2014  . Lactose intolerance documented with breath testing 10/25/2013  . Irritable bowel syndrome 05/23/2013  . 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 Allergic rhinitis 05/05/2013  . PCOS (polycystic ovarian syndrome) 05/05/2013    Past Surgical History:  Procedure Laterality Date  . COLONOSCOPY    . MRI     under anesthesia  . 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 UMBILICAL HERNIA REPAIR      OB History   No obstetric history on file.      Home Medications    Prior to Admission medications   Medication Sig Start Date End Date Taking? Authorizing Provider  albuterol (PROAIR HFA) 108 (90 Base) MCG/ACT inhaler Inhale 2 puffs into the lungs every 4 (four) hours as needed for wheezing or shortness of breath. 11/24/19   Hall-Potvin, [place], PA-C  benztropine (COGENTIN) 1 MG tablet Take 1 tablet (1 mg total) by mouth 2 (two) times daily. 08/03/19   [name], MD  cetirizine (ZYRTEC ALLERGY) 10 MG tablet Take 1 tablet (10 mg total) by mouth daily. 11/24/19   Hall-Potvin, [place], PA-C  docusate (COLACE) 50 MG/5ML liquid Take 5 mLs (50 mg total) by mouth daily as needed for mild constipation. 05/17/19   Lind Covert, MD  fluticasone (FLONASE) 50 MCG/ACT nasal spray Place 2 sprays into both nostrils 2 (two) times daily. 11/24/19   Hall-Potvin, [place], PA-C  hyoscyamine (LEVSIN SL) 0.125 MG SL tablet Place 1 tablet (0.125 mg total) under the tongue every 4 (four) hours as needed. 08/31/19   Zehr, Laban Emperor, PA-C  levonorgestrel (MIRENA) 20 MCG/24HR IUD 1 Intra Uterine Device (1 each total) by Intrauterine route once. 09/28/16 08/31/19  Parthenia Ames, NP  Melatonin 5 MG  TABS Take 1 tablet by mouth daily as needed (sleep).     [provider]  OLANZapine (ZYPREXA) 20 MG tablet Take 1 tablet (20 mg total) by mouth at bedtime. 08/03/19   [name], MD  Olopatadine HCl (PAZEO) 0.7 % SOLN Place 1 mL into both eyes daily as needed (allergies).  12/25/15   [provider]  polyethylene glycol powder (GLYCOLAX/MIRALAX) 17 GM/SCOOP powder Take 17 g by mouth 2 (two) times daily as needed. Patient taking differently: Take 17 g by mouth daily.  08/17/19   Shirley, [place], DO  RESTASIS 0.05 % ophthalmic emulsion 1 DROP TWICE A DAY INTO BOTH  EYES 08/14/18   [provider]  Spacer/Aero-Holding Chambers (AEROCHAMBER PLUS FLO-VU MEDIUM) MISC 1 each by Other route once for 1 dose. 11/24/19 11/24/19  Hall-Potvin, [place], PA-C    Family History Family History  Problem Relation Age of Onset  . Diabetes Maternal Grandfather   . Hypertension Maternal Grandfather   . Heart disease Maternal Grandfather   . Kidney disease Maternal Grandfather   . Anesthesia problems Maternal Grandfather        hx. of being hard to wake up post-op  . Hypertension Maternal Grandmother   . Stroke Maternal Grandmother   . Asthma Mother   . Hypertension Maternal Uncle   . Depression Sister   . Cancer Other     Social History Social History   Tobacco Use  . Smoking status: Never Smoker  . Smokeless tobacco: Never Used  Substance Use Topics  . Alcohol use: No    Alcohol/week: 0.0 standard drinks  . Drug use: No     Allergies   Lactose intolerance (gi)   Review of Systems Review of Systems  Constitutional: Positive for chills. Negative for activity change, appetite change, fatigue and fever.  HENT: Positive for rhinorrhea and sneezing. Negative for congestion, dental problem, ear pain, facial swelling, hearing loss, sinus pain, sore throat, trouble swallowing and voice change.   Eyes: Negative for photophobia, pain and visual disturbance.  Respiratory: Positive for cough. Negative for shortness of breath, wheezing and stridor.   Cardiovascular: Negative for chest pain, palpitations and leg swelling.  Gastrointestinal: Negative for diarrhea, nausea and vomiting.  Musculoskeletal: Negative for arthralgias and myalgias.  Neurological: Negative for dizziness and headaches.     Physical Exam Triage Vital Signs ED Triage Vitals  Enc Vitals Group     BP      Pulse      Resp      Temp      Temp src      SpO2      Weight      Height      Head Circumference      Peak Flow      Pain Score      Pain Loc      Pain Edu?       Excl. in [place]?    No data found.  Updated Vital Signs BP 119/81 (BP Location: Right Arm)   Pulse (!) 120   Temp 98.1 F (36.7 C) (Temporal)   Resp 18   SpO2 96%   Visual Acuity Right Eye Distance:   Left Eye Distance:   Bilateral Distance:    Right Eye Near:   Left Eye Near:    Bilateral Near:     Physical Exam Constitutional:      General: She is not in acute distress.    Appearance: She is obese. She is not  ill-appearing or diaphoretic.  HENT:     Head: Normocephalic and atraumatic.     Mouth/Throat:     Mouth: Mucous membranes are moist.     Pharynx: Oropharynx is clear. No oropharyngeal exudate or posterior oropharyngeal erythema.  Eyes:     General: No scleral icterus.    Conjunctiva/sclera: Conjunctivae normal.     Pupils: Pupils are equal, round, and reactive to light.  Neck:     Comments: Trachea midline, negative JVD Cardiovascular:     Rate and Rhythm: Normal rate and regular rhythm.     Heart sounds: No murmur. No gallop.   Pulmonary:     Effort: Pulmonary effort is normal. No respiratory distress.     Breath sounds: No wheezing, rhonchi or rales.  Musculoskeletal:     Cervical back: Neck supple. No tenderness.  Lymphadenopathy:     Cervical: No cervical adenopathy.  Skin:    Capillary Refill: Capillary refill takes less than 2 seconds.     Coloration: Skin is not jaundiced or pale.     Findings: No rash.  Neurological:     General: No focal deficit present.     Mental Status: She is alert and oriented to person, place, and time.      UC Treatments / Results  Labs (all labs ordered are listed, but only abnormal results are displayed) Labs Reviewed  NOVEL CORONAVIRUS, NAA    EKG   Radiology No results found.  Procedures Procedures (including critical care time)  Medications Ordered in UC Medications - No data to display  Initial Impression / Assessment and Plan / UC Course  I have reviewed the triage vital signs and the nursing  notes.  Pertinent labs & imaging results that were available during my care of the patient were reviewed by me and considered in my medical decision making (see chart for details).     Patient afebrile, nontoxic, with SpO2 96%.  Tachycardic though asymptomatic, feeling nervous about covid exposure: denies SI/HI.  Covid PCR pending.  Patient to quarantine until results are back.  We will continue supportive management as outlined below: refills sent.  Return precautions discussed, patient verbalized understanding and is agreeable to plan. Final Clinical Impressions(s) / UC Diagnoses   Final diagnoses:  Exposure to COVID-19 virus  Acute URI     Discharge Instructions     Your COVID test is pending - it is important to quarantine / isolate at home until your results are back. If you test positive and would like further evaluation for persistent or worsening symptoms, you may schedule an E-visit or virtual (video) visit throughout the [facility] app or website.  PLEASE NOTE: If you develop severe chest pain or shortness of breath please go to the ER or call [phone] for further evaluation --> DO NOT schedule electronic or virtual visits for this. Please call our office for further guidance / recommendations as needed.    ED Prescriptions    Medication Sig Dispense Auth. Provider   fluticasone (FLONASE) 50 MCG/ACT nasal spray Place 2 sprays into both nostrils 2 (two) times daily. 16 g Hall-Potvin, [place], PA-C   albuterol (PROAIR HFA) 108 (90 Base) MCG/ACT inhaler Inhale 2 puffs into the lungs every 4 (four) hours as needed for wheezing or shortness of breath. 18 g Hall-Potvin, [place], PA-C   Spacer/Aero-Holding Chambers (AEROCHAMBER PLUS FLO-VU MEDIUM) MISC 1 each by Other route once for 1 dose. 1 each Hall-Potvin, [place], PA-C   cetirizine (ZYRTEC ALLERGY) 10 MG  tablet Take 1 tablet (10 mg total) by mouth daily. 30 tablet Hall-Potvin, [place], PA-C     PDMP not reviewed  this encounter.   Hall-Potvin, [place], [place] 11/24/19 1646

## 2019-11-24 NOTE — Discharge Instructions (Signed)
Your COVID test is pending - it is important to quarantine / isolate at home until your results are back. °If you test positive and would like further evaluation for persistent or worsening symptoms, you may schedule an E-visit or virtual (video) visit throughout the [facility] MyChart app or website. ° °PLEASE NOTE: If you develop severe chest pain or shortness of breath please go to the ER or call 9-1-1 for further evaluation --> DO NOT schedule electronic or virtual visits for this. °Please call our office for further guidance / recommendations as needed. °

## 2019-11-25 LAB — NOVEL CORONAVIRUS, NAA: SARS-CoV-2, NAA: DETECTED — AB

## 2019-11-26 ENCOUNTER — Telehealth: Payer: Self-pay | Admitting: Adult Health

## 2019-11-26 NOTE — Telephone Encounter (Signed)
Called patient about her eligibility to receive monoclonal antibody treatment for her COVID19 infection.  Date Tested: 11/24/2019   Date of Symptom onset: 11/22/2019   Symptoms: cough, congestion  Natalie Wagner has asthma and a BMI of 38 (verified her height and weight).  I reviewed with her that she is at increased risk for hospitalization and severe COVID, and she is eligible to receive treatment with a monoclonal antibody which can help decrease that risk.  I reviewed how the treatment works, and that she is within the window to be treated which is 10 days from symptom onset.  We discussed risks and benefits.  Natalie Wagner would like to think it over, and will call us back if she decides to pursue treatment.   Patient voiced understanding and appreciation of the call.  Wilber Bihari, NP

## 2019-11-28 ENCOUNTER — Other Ambulatory Visit: Payer: Self-pay

## 2019-11-28 ENCOUNTER — Emergency Department (HOSPITAL_BASED_OUTPATIENT_CLINIC_OR_DEPARTMENT_OTHER)
Admission: EM | Admit: 2019-11-28 | Discharge: 2019-11-28 | Disposition: A | Discharge status: Home / Self Care | Payer: Medicaid Other | Attending: Emergency Medicine | Admitting: Emergency Medicine

## 2019-11-28 ENCOUNTER — Emergency Department (HOSPITAL_BASED_OUTPATIENT_CLINIC_OR_DEPARTMENT_OTHER): Payer: Medicaid Other

## 2019-11-28 ENCOUNTER — Encounter (HOSPITAL_BASED_OUTPATIENT_CLINIC_OR_DEPARTMENT_OTHER): Payer: Self-pay

## 2019-11-28 DIAGNOSIS — Z79899 Other long term (current) drug therapy: Secondary | ICD-10-CM | POA: Diagnosis not present

## 2019-11-28 DIAGNOSIS — J4520 Mild intermittent asthma, uncomplicated: Secondary | ICD-10-CM | POA: Insufficient documentation

## 2019-11-28 DIAGNOSIS — U071 COVID-19: Secondary | ICD-10-CM | POA: Insufficient documentation

## 2019-11-28 DIAGNOSIS — R0602 Shortness of breath: Secondary | ICD-10-CM | POA: Diagnosis not present

## 2019-11-28 DIAGNOSIS — R05 Cough: Secondary | ICD-10-CM | POA: Diagnosis not present

## 2019-11-28 DIAGNOSIS — R509 Fever, unspecified: Secondary | ICD-10-CM | POA: Diagnosis present

## 2019-11-28 MED ORDER — AEROCHAMBER PLUS FLO-VU MISC
1.0000 | Freq: Once | Status: AC
  Administered 2019-11-28: 1
  Filled 2019-11-28: qty 1

## 2019-11-28 MED ORDER — ACETAMINOPHEN 160 MG/5ML PO SOLN
650.0000 mg | Freq: Once | ORAL | Status: AC
  Administered 2019-11-28: 650 mg via ORAL
  Filled 2019-11-28: qty 20.3

## 2019-11-28 MED ORDER — ALBUTEROL SULFATE HFA 108 (90 BASE) MCG/ACT IN AERS
6.0000 | INHALATION_SPRAY | Freq: Once | RESPIRATORY_TRACT | Status: AC
  Administered 2019-11-28: 6 via RESPIRATORY_TRACT
  Filled 2019-11-28: qty 6.7

## 2019-11-28 MED ORDER — DEXAMETHASONE 6 MG PO TABS
6.0000 mg | ORAL_TABLET | Freq: Once | ORAL | Status: AC
  Administered 2019-11-28: 6 mg via ORAL
  Filled 2019-11-28: qty 1

## 2019-11-28 MED ORDER — DEXAMETHASONE 6 MG PO TABS: 6.0000 mg | ORAL_TABLET | Freq: Every day | ORAL | 0 refills | Status: AC

## 2019-11-28 MED ORDER — ACETAMINOPHEN 325 MG PO TABS: 650.0000 mg | ORAL_TABLET | Freq: Once | ORAL | Status: DC

## 2019-11-28 NOTE — ED Triage Notes (Signed)
Pt arrives POV with mother with c/o cough, chest tightness, and SOB.

## 2019-11-28 NOTE — Discharge Instructions (Addendum)
Your COVID-19 test on January 15 was positive.  You were contacted 2 days ago and offered treatment with monoclonal antibodies.  Please follow-up if this is treatment he would like to pursue. Continue to quarantine as previously instructed.   Take Decadron daily as prescribed, continue to use your inhaler as needed as prescribed. You can also take vitamin C, B, D, zinc and melatonin (take melatonin at night). Return to the ER for new or worsening symptoms.

## 2019-11-28 NOTE — ED Provider Notes (Signed)
[facility] EMERGENCY DEPARTMENT Provider Note   CSN: [id number] Arrival date & time: 11/28/19  1219     History Chief Complaint  Patient presents with  . Chest Pain  . Cough    Natalie Wagner is a 24 y.o. female.  24 year old female presents with complaint of fevers, chills, cough, shortness of breath, chest tightness and wheezing for the past 2 to 3 days.  Patient is exposed to her mom who is sick with similar symptoms.  Patient has a history of asthma, states she does not normally need to use an inhaler and does not have one at home, has not had a chance to pick it up from the pharmacy yet.  Natalie Wagner was evaluated in Emergency Department on 11/28/2019 for the symptoms described in the history of present illness. She was evaluated in the context of the global COVID-19 pandemic, which necessitated consideration that the patient might be at risk for infection with the SARS-CoV-2 virus that causes COVID-19. Institutional protocols and algorithms that pertain to the evaluation of patients at risk for COVID-19 are in a state of rapid change based on information released by regulatory bodies including the CDC and federal and state organizations. These policies and algorithms were followed during the patient's care in the ED.         Past Medical History:  Diagnosis Date  . Asthma    prn inhaler  . Constipation 05/05/2013  . Difficulty swallowing pills   . Eating disorder 07/27/2016  . Elevated prolactin level 12/27/2014  . Galactorrhea 12/27/2014  . History of seizures as a child    mother states were triggered by migraines; no seizures in > 7 yr.  . 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 Migraines   . Schizophrenia ([place])   . Simple cyst of kidney 01/24/2016  . Sleep apnea     Patient Active Problem List   Diagnosis Date Noted  . Sleep apnea 02/10/2018  . Obesity 03/02/2017  . Mild  intermittent asthma, uncomplicated [id number]  . Tension-type headache, not intractable 10/21/2016  . Convulsions ([place]) 09/03/2015  . 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 Hyperlipidemia LDL goal <130 05/23/2014  . Lactose intolerance documented with breath testing 10/25/2013  . Irritable bowel syndrome 05/23/2013  . 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 Allergic rhinitis 05/05/2013  . PCOS (polycystic ovarian syndrome) 05/05/2013    Past Surgical History:  Procedure Laterality Date  . COLONOSCOPY    . MRI     under anesthesia  . 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 UMBILICAL HERNIA REPAIR       OB History   No obstetric history on file.     Family History  Problem Relation Age of Onset  . Diabetes Maternal Grandfather   . Hypertension Maternal Grandfather   . Heart disease Maternal Grandfather   . Kidney disease Maternal Grandfather   . Anesthesia problems Maternal Grandfather        hx. of being hard to wake up post-op  . Hypertension Maternal Grandmother   . Stroke Maternal Grandmother   . Asthma Mother   . Hypertension Maternal Uncle   . Depression Sister   . Cancer Other     Social History  Tobacco Use  . Smoking status: Never Smoker  . Smokeless tobacco: Never Used  Substance Use Topics  . Alcohol use: No    Alcohol/week: 0.0 standard drinks  . Drug use: No    Home Medications Prior to Admission medications   Medication Sig Start Date End Date Taking? Authorizing Provider  albuterol (PROAIR HFA) 108 (90 Base) MCG/ACT inhaler Inhale 2 puffs into the lungs every 4 (four) hours as needed for wheezing or shortness of breath. 11/24/19   Hall-Potvin, [place], PA-C  benztropine (COGENTIN) 1 MG tablet Take 1 tablet (1 mg total) by mouth 2 (two) times daily. 08/03/19   [name],  MD  cetirizine (ZYRTEC ALLERGY) 10 MG tablet Take 1 tablet (10 mg total) by mouth daily. 11/24/19   Hall-Potvin, [place], PA-C  dexamethasone (DECADRON) 6 MG tablet Take 1 tablet (6 mg total) by mouth daily for 9 days. 11/28/19 12/07/19  Tacy Learn, PA-C  docusate (COLACE) 50 MG/5ML liquid Take 5 mLs (50 mg total) by mouth daily as needed for mild constipation. 05/17/19   Lind Covert, MD  fluticasone (FLONASE) 50 MCG/ACT nasal spray Place 2 sprays into both nostrils 2 (two) times daily. 11/24/19   Hall-Potvin, [place], PA-C  hyoscyamine (LEVSIN SL) 0.125 MG SL tablet Place 1 tablet (0.125 mg total) under the tongue every 4 (four) hours as needed. 08/31/19   Zehr, Laban Emperor, PA-C  levonorgestrel (MIRENA) 20 MCG/24HR IUD 1 Intra Uterine Device (1 each total) by Intrauterine route once. 09/28/16 08/31/19  Parthenia Ames, NP  Melatonin 5 MG TABS Take 1 tablet by mouth daily as needed (sleep).     [provider]  OLANZapine (ZYPREXA) 20 MG tablet Take 1 tablet (20 mg total) by mouth at bedtime. 08/03/19   [name], MD  Olopatadine HCl (PAZEO) 0.7 % SOLN Place 1 mL into both eyes daily as needed (allergies).  12/25/15   [provider]  polyethylene glycol powder (GLYCOLAX/MIRALAX) 17 GM/SCOOP powder Take 17 g by mouth 2 (two) times daily as needed. Patient taking differently: Take 17 g by mouth daily.  08/17/19   Shirley, [place], DO  RESTASIS 0.05 % ophthalmic emulsion 1 DROP TWICE A DAY INTO BOTH EYES 08/14/18   [provider]    Allergies    Lactose intolerance (gi)  Review of Systems   Review of Systems  Constitutional: Positive for chills and fever.  HENT: Positive for congestion. Negative for sneezing and sore throat.   Respiratory: Positive for cough, chest tightness, shortness of breath and wheezing.   Gastrointestinal: Negative for abdominal pain, diarrhea, nausea and vomiting.  Musculoskeletal: Positive for arthralgias and myalgias.  Skin:  Negative for rash and wound.  Allergic/Immunologic: Negative for immunocompromised state.  Neurological: Negative for weakness.  Psychiatric/Behavioral: Negative for confusion.  All other systems reviewed and are negative.   Physical Exam Updated Vital Signs BP 112/83 (BP Location: Right Arm)   Pulse (!) 117   Temp (!) 101.3 F (38.5 C) (Oral)   Resp 20   Ht 4\' 11"  (1.499 m)   Wt 88.5 kg   SpO2 95%   BMI 39.39 kg/m   Physical Exam Vitals and nursing note reviewed.  Constitutional:      General: She is not in acute distress.    Appearance: She is well-developed. She is not diaphoretic.  HENT:     Head: Normocephalic and atraumatic.  Cardiovascular:     Rate and Rhythm: Regular rhythm. Tachycardia present.     Heart  sounds: Normal heart sounds.  Pulmonary:     Effort: Pulmonary effort is normal.     Breath sounds: Decreased breath sounds present. No wheezing.  Abdominal:     Palpations: Abdomen is soft.     Tenderness: There is no abdominal tenderness.  Musculoskeletal:     Cervical back: Neck supple.     Right lower leg: No edema.     Left lower leg: No edema.  Lymphadenopathy:     Cervical: No cervical adenopathy.  Skin:    General: Skin is warm and dry.     Findings: No erythema or rash.  Neurological:     Mental Status: She is alert and oriented to person, place, and time.  Psychiatric:        Behavior: Behavior normal.     ED Results / Procedures / Treatments   Labs (all labs ordered are listed, but only abnormal results are displayed) Labs Reviewed - No data to display  EKG None  Radiology DG Chest [facility] 1 View  Result Date: 11/28/2019 CLINICAL DATA:  Cough. Additional history provided: Cough, chest tightness, shortness of breath EXAM: PORTABLE CHEST 1 VIEW COMPARISON:  No pertinent prior studies available for comparison. FINDINGS: Heart size within normal limits. Shallow inspiration radiograph. Multifocal airspace opacities within the right lung  consistent with pneumonia. Subtle airspace disease is also questioned within the left lung base. No evidence of pleural effusion or pneumothorax. No acute bony abnormality. IMPRESSION: Shallow inspiration radiograph. Multifocal airspace opacities within the right lung consistent with pneumonia. Subtle airspace disease is also questioned within the left lung base. Electronically Signed   By: Kellie Simmering DO   On: 11/28/2019 13:44    Procedures Procedures (including critical care time)  Medications Ordered in ED Medications  albuterol (VENTOLIN HFA) 108 (90 Base) MCG/ACT inhaler 6 puff (6 puffs Inhalation Given 11/28/19 1346)  aerochamber plus with mask device 1 each (1 each Other Given 11/28/19 1350)  acetaminophen (TYLENOL) 160 MG/5ML solution 650 mg (650 mg Oral Given 11/28/19 1346)  dexamethasone (DECADRON) tablet 6 mg (6 mg Oral Given 11/28/19 1359)    ED Course  I have reviewed the triage vital signs and the nursing notes.  Pertinent labs & imaging results that were available during my care of the patient were reviewed by me and considered in my medical decision making (see chart for details).  Clinical Course as of Nov 27 1632  Tue Jan 19, 551  2516 24 year old female with history of asthma presents with complaint of fevers, chills, cough, wheezing, chest tightness.  On exam patient had diminished lung sounds, was febrile and tachycardic otherwise well-appearing.  Patient was given albuterol inhaler as well as Decadron and Tylenol.  Patient reports slight improvement in her symptoms, states that she is still coughing.  Chest x-ray shows multifocal airspace opacities within the right lung consistent with pneumonia. Subtle airspace disease is also questioned within the left lung base.  Patient was exposed to her mother who is COVID positive. Patient had a positive COVID test 11/24/19. Plan is to treat with decadron for 10 days (day 1 dose given in the ER), continue with albuterol, can add  multivitamin. Recheck with PCP.    [LM]    Clinical Course User Index [LM] Roque Lias   MDM Rules/Calculators/A&P                      Final Clinical Impression(s) / ED Diagnoses Final diagnoses:  COVID-19  Mild  intermittent asthma, unspecified whether complicated    Rx / DC Orders ED Discharge Orders         Ordered    dexamethasone (DECADRON) 6 MG tablet  Daily     11/28/19 1511           Tacy Learn, PA-C 11/28/19 1635    Little, Wenda Overland, MD 11/29/19 [phone]

## 2019-11-30 ENCOUNTER — Other Ambulatory Visit: Payer: Self-pay

## 2019-11-30 ENCOUNTER — Telehealth (HOSPITAL_COMMUNITY): Payer: Self-pay | Admitting: Licensed Clinical Social Worker

## 2019-11-30 ENCOUNTER — Ambulatory Visit (HOSPITAL_COMMUNITY): Payer: Medicaid Other | Admitting: Licensed Clinical Social Worker

## 2019-11-30 NOTE — Telephone Encounter (Signed)
Clinician called and spoke to [facility] at scheduled appointment time. Natalie Wagner reported that she and her entire family had [place]. Clinician agreed to cancel the appointment with no charge due to illness.

## 2019-12-05 ENCOUNTER — Other Ambulatory Visit: Payer: Self-pay

## 2019-12-05 ENCOUNTER — Telehealth (HOSPITAL_COMMUNITY): Payer: Self-pay | Admitting: Licensed Clinical Social Worker

## 2019-12-05 ENCOUNTER — Encounter (HOSPITAL_COMMUNITY): Payer: Self-pay | Admitting: Licensed Clinical Social Worker

## 2019-12-05 ENCOUNTER — Ambulatory Visit (INDEPENDENT_AMBULATORY_CARE_PROVIDER_SITE_OTHER): Payer: Medicaid Other | Admitting: Licensed Clinical Social Worker

## 2019-12-05 DIAGNOSIS — F201 Disorganized schizophrenia: Secondary | ICD-10-CM

## 2019-12-05 NOTE — Progress Notes (Signed)
Virtual Visit via Video Note  I connected with Natalie Wagner on 12/05/19 at 11:00 AM EST by a video enabled telemedicine application and verified that I am speaking with the correct person using two identifiers.     I discussed the limitations of evaluation and management by telemedicine and the availability of in person appointments. The patient expressed understanding and agreed to proceed.  Type of Therapy: Individual Therapy  Treatment Goals addressed: "I want to work on my anxiety, depression, self-confidence, and my ability to socialize"  Interventions: Motivational Interviewing, CBT, Grounding & Mindfulness Techniques, psychoeducation  Summary: Natalie Wagner is a 23y.o. female who presents with Schizophrenia, disorganized type  Suicidal/Homicidal: No -without intent/plan  Therapist Response:  Monae met with clinician for an individual session. [name] discussed her psychiatric symptoms, her current life events and her homework. [name] sharedthat she felt much better than she did yesterday. She reported yesterday that she had been very depressed, having scary thoughts about hurting herself, and not feeling able to control her mind. Clinician explored coping skills, particularly related to the thoughts. Clinician assisted by demonstrating a visualization of her thoughts covered in oil and being able to slip in and out of her mind without sticking. [name] reported that she worked on that yesterday and did not allow the thoughts to stay in her mind. She also reports staying close to mom and talking to her sister about how she was feeling.  Clinician notified Dr. [name] about what was happening in order to prepare for session on 1/28.    Plan: Return again in2weeks  Diagnosis: Axis I: Schizophrenia, disorganized type     I discussed the assessment and treatment plan with the patient. The patient was provided an opportunity to ask questions and all were  answered. The patient agreed with the plan and demonstrated an understanding of the instructions.   The patient was advised to call back or seek an in-person evaluation if the symptoms worsen or if the condition fails to improve as anticipated.  I provided 45 minutes of non-face-to-face time during this encounter.   [name], LCSW

## 2019-12-05 NOTE — Telephone Encounter (Signed)
Kalina called the office requesting a call back from clinician. Clinician called and spoke with Loma Sousa, who reported intrusive thoughts about wanting to hurt herself. Empryss was able to talk through the thoughts, validated that she had no intention to hurt herself, but felt scared that the thoughts were present. Jesus identified frustration, sadness and fear about having to quarantine due to COVID positive test and sxs. Clinician was able to safety plan with Mariacamila if thoughts increase. Clinician scheduled appointment for following day.

## 2019-12-07 ENCOUNTER — Ambulatory Visit (INDEPENDENT_AMBULATORY_CARE_PROVIDER_SITE_OTHER): Payer: Medicaid Other | Admitting: Psychiatry

## 2019-12-07 ENCOUNTER — Other Ambulatory Visit: Payer: Self-pay

## 2019-12-07 ENCOUNTER — Encounter (HOSPITAL_COMMUNITY): Payer: Self-pay | Admitting: Psychiatry

## 2019-12-07 DIAGNOSIS — G4700 Insomnia, unspecified: Secondary | ICD-10-CM

## 2019-12-07 DIAGNOSIS — F913 Oppositional defiant disorder: Secondary | ICD-10-CM

## 2019-12-07 DIAGNOSIS — F203 Undifferentiated schizophrenia: Secondary | ICD-10-CM | POA: Diagnosis not present

## 2019-12-07 DIAGNOSIS — F331 Major depressive disorder, recurrent, moderate: Secondary | ICD-10-CM

## 2019-12-07 DIAGNOSIS — F201 Disorganized schizophrenia: Secondary | ICD-10-CM

## 2019-12-07 MED ORDER — BENZTROPINE MESYLATE 1 MG PO TABS: 1.0000 mg | ORAL_TABLET | Freq: Two times a day (BID) | ORAL | 0 refills | Status: DC

## 2019-12-07 MED ORDER — OLANZAPINE 20 MG PO TABS: 20.0000 mg | ORAL_TABLET | Freq: Every day | ORAL | 0 refills | Status: DC

## 2019-12-07 NOTE — Progress Notes (Signed)
Virtual Visit via Video Note  I connected with Natalie Wagner on 12/07/19 at 10:45 AM EST by a phone. We attempted to connect by video enabled telemedicine application but she was not able to. I verified that I am speaking with the correct person using two identifiers.  Location: Patient: home Provider: office   I discussed the limitations of evaluation and management by telemedicine and the availability of in person appointments. The patient expressed understanding and agreed to proceed.  History of Present Illness: Natalie Wagner was diagnosed with COVID last week. She was feeling very sick but was treated with meds. She has ongoing depression and anxiety. With the added stress of getting sick with COVID she experienced thoughts SIB but did not engage. She took time to think about it and her sister gave her advice. She went out with her sisters. This all helped. SIB is always in the back of her mind and it is concerning. She denies SI/HI. Her depression level is about 5/10 (10 being the worst). She is feeling sad and nervous. The COVID restrictions are taking a toll. Her sleep is good. Natalie Wagner admits she was having AH last week that contributed to SIB. The voices were telling her to "end it all". She has not heard any voices since that day. Prior to that she had only experienced AH about 2-3x since our last visit. Natalie Wagner denies paranoia and VH. She is getting along with her mom really well.    Observations/Objective:  General Appearance: Unable to assess  Eye Contact:  Unable to assess  Speech:  Clear and Coherent and Slow  Volume:  Normal  Mood:  Anxious and Depressed  Affect:  Congruent  Thought Process:  Coherent, Linear and Descriptions of Associations: Intact  Orientation:  Full (Time, Place, and Person)  Thought Content:  Logical  Suicidal Thoughts:  No  Homicidal Thoughts:  No  Memory:  Immediate;   Good  Judgement:  Good  Insight:  Shallow  Psychomotor Activity:  Unable to  assess  Concentration:  Concentration: Fair  Recall:  Good  Fund of Knowledge:  Fair  Language:  Good  Akathisia:  Unable to assess  Handed:  Right  AIMS (if indicated):     Assets:  Communication Skills Desire for Improvement Financial Resources/Insurance Housing Resilience Social Support  ADL's: Unable to assess  Cognition:  WNL  Sleep:        Assessment and Plan: Schizophrenia undifferentiated type; MDD-recurrent, moderate; ODD; Insomnia; r/o eating d/o  Increased stress likely contributed to [facility] and SIB. Symptoms have significantly improved since that day.   Status of current symptoms: stable  Zyprexa 20mg  po qHS  Cogentin 1mg  po BID  Encouraged to continue therapy  11/29/2019 EKG shows Qtc 400  Follow Up Instructions: In 2-3 months or sooner if needed   I discussed the assessment and treatment plan with the patient. The patient was provided an opportunity to ask questions and all were answered. The patient agreed with the plan and demonstrated an understanding of the instructions.   The patient was advised to call back or seek an in-person evaluation if the symptoms worsen or if the condition fails to improve as anticipated.  I provided 20 minutes of non-face-to-face time during this encounter.   [name], MD

## 2019-12-21 ENCOUNTER — Ambulatory Visit (INDEPENDENT_AMBULATORY_CARE_PROVIDER_SITE_OTHER): Payer: Medicaid Other | Admitting: Licensed Clinical Social Worker

## 2019-12-21 ENCOUNTER — Encounter (HOSPITAL_COMMUNITY): Payer: Self-pay | Admitting: Licensed Clinical Social Worker

## 2019-12-21 ENCOUNTER — Other Ambulatory Visit: Payer: Self-pay

## 2019-12-21 DIAGNOSIS — F201 Disorganized schizophrenia: Secondary | ICD-10-CM | POA: Diagnosis not present

## 2019-12-21 NOTE — Progress Notes (Signed)
Virtual Visit via Video Note  I connected with Natalie Wagner on 12/21/19 at  1:30 PM EST by a video enabled telemedicine application and verified that I am speaking with the correct person using two identifiers.    I discussed the limitations of evaluation and management by telemedicine and the availability of in person appointments. The patient expressed understanding and agreed to proceed.  Type of Therapy: Individual Therapy  Treatment Goals addressed: "I want to work on my anxiety, depression, self-confidence, and my ability to socialize"  Interventions: Motivational Interviewing, CBT, Grounding & Mindfulness Techniques, psychoeducation  Summary: Natalie Wagner is a 23y.o. female who presents with Schizophrenia, disorganized type  Suicidal/Homicidal: No -without intent/plan  Therapist Response:  Avenly met with clinician for an individual session. Kolbi discussed her psychiatric symptoms, her current life events and her homework. Rashelle sharedthat she has been feeling a lot better. Clinician utilized CBT and  explored thoughts, feelings, and behaviors present at last session which were concerning. Clinician discussed outcomes of last appointment with Dr [name]. Clinician noted that steroid prescribed for COVID may have caused some mood changes and thoughts of self harm. Lynnie reported that since she is now off that medication, she is feeling much better. Clinician explored coping skills and noted that coloring, relaxing, cooking, and spending time with her family have been helpful. Alanny was coloring in session and shared her completed sheets. Clinician reviewed treatment goals and noted on significant change, as she still wants to work on anxiety, depressed mood, socialization, and self confidence.    Plan: Return again in2weeks  Diagnosis: Axis I: Schizophrenia, disorganized type     I discussed the assessment and treatment plan with the patient.  The patient was provided an opportunity to ask questions and all were answered. The patient agreed with the plan and demonstrated an understanding of the instructions.   The patient was advised to call back or seek an in-person evaluation if the symptoms worsen or if the condition fails to improve as anticipated.  I provided 45 minutes of non-face-to-face time during this encounter.   [name], LCSW

## 2020-01-04 ENCOUNTER — Ambulatory Visit (INDEPENDENT_AMBULATORY_CARE_PROVIDER_SITE_OTHER): Payer: Medicaid Other | Admitting: Licensed Clinical Social Worker

## 2020-01-04 ENCOUNTER — Encounter (HOSPITAL_COMMUNITY): Payer: Self-pay | Admitting: Licensed Clinical Social Worker

## 2020-01-04 ENCOUNTER — Other Ambulatory Visit: Payer: Self-pay

## 2020-01-04 DIAGNOSIS — F201 Disorganized schizophrenia: Secondary | ICD-10-CM

## 2020-01-04 NOTE — Progress Notes (Signed)
Virtual Visit via Video Note  I connected with Natalie Wagner on 01/04/20 at 12:30 PM EST by a video enabled telemedicine application and verified that I am speaking with the correct person using two identifiers.     I discussed the limitations of evaluation and management by telemedicine and the availability of in person appointments. The patient expressed understanding and agreed to proceed.   Type of Therapy: Individual Therapy  Treatment Goals addressed: "I want to work on my anxiety, depression, self-confidence, and my ability to socialize"  Interventions: Motivational Interviewing, Mindfulness Techniques  Summary: Natalie Wagner is a 23y.o. female who presents with Schizophrenia, disorganized type  Suicidal/Homicidal: No -without intent/plan  Therapist Response:  Fe met with clinician for an individual session. Mikalyn discussed her psychiatric symptoms, her current life events and her homework. Nickole shared that things are going well. Clinician explored sxs of anxiety and psychosis using MI. Clinician reflected increased motivation to allow life to be relaxed and to focus on self care. Clinician viewed coloring book that [name] has been working on and discussed the usefulness of mindful coloring. Clinician discussed medication management and researched any side effects of Cogentin. [name] reported increased stomach issues, but was uncertain if it was due to the medication or due to her IBS.  Clinician provided psychoeducation about deep breathing/cooling breaths in order to reduce anxiety. Clinician also identified a grounding pose (legs up the wall) to assist with calming down and relaxation.    Plan: Return again in2weeks  Diagnosis: Axis I: Schizophrenia, disorganized type  I discussed the assessment and treatment plan with the patient. The patient was provided an opportunity to ask questions and all were answered. The patient agreed with the plan  and demonstrated an understanding of the instructions.   The patient was advised to call back or seek an in-person evaluation if the symptoms worsen or if the condition fails to improve as anticipated.  I provided 45 minutes of non-face-to-face time during this encounter.   Natalie Curling, LCSW

## 2020-01-17 ENCOUNTER — Ambulatory Visit (INDEPENDENT_AMBULATORY_CARE_PROVIDER_SITE_OTHER): Payer: Medicaid Other | Admitting: Licensed Clinical Social Worker

## 2020-01-17 ENCOUNTER — Other Ambulatory Visit: Payer: Self-pay

## 2020-01-17 ENCOUNTER — Encounter (HOSPITAL_COMMUNITY): Payer: Self-pay | Admitting: Licensed Clinical Social Worker

## 2020-01-17 DIAGNOSIS — F201 Disorganized schizophrenia: Secondary | ICD-10-CM

## 2020-01-17 NOTE — Progress Notes (Signed)
Virtual Visit via Video Note  I connected with [name] on 01/17/20 at 11:00 AM EST by a video enabled telemedicine application and verified that I am speaking with the correct person using two identifiers.     I discussed the limitations of evaluation and management by telemedicine and the availability of in person appointments. The patient expressed understanding and agreed to proceed.  Type of Therapy: Individual Therapy  Treatment Goals addressed: "I want to work on my anxiety, depression, self-confidence, and my ability to socialize"  Interventions: Motivational Interviewing, Mindfulness Techniques  Summary: Natalie Wagner is a 23y.o. female who presents with Schizophrenia, disorganized type  Suicidal/Homicidal: No -without intent/plan  Therapist Response:  Flossie met with clinician for an individual session. [name] discussed her psychiatric symptoms, her current life events and her homework. Ia shared that she has been doing well. Clinician explored sxs and progress toward her goals. Clinician noted that things have been stable and she has not really been experiencing much anxiety or sxs due to taking meds at the same time daily. Clinician processed interactions with family at home. Clinician utilized MI OARS to affirm and reflect thoughts and feelings about plans for the future. Clinician researched courses at [facility] [employer] for fun or for academics in order to get [name] more motivated to do something outside of the home.    Plan: Return again in4weeks  Diagnosis: Axis I: Schizophrenia, disorganized type     I discussed the assessment and treatment plan with the patient. The patient was provided an opportunity to ask questions and all were answered. The patient agreed with the plan and demonstrated an understanding of the instructions.   The patient was advised to call back or seek an in-person evaluation if the symptoms worsen or if the condition fails  to improve as anticipated.  I provided 25 minutes of non-face-to-face time during this encounter.   [name], LCSW

## 2020-01-31 ENCOUNTER — Ambulatory Visit (HOSPITAL_COMMUNITY): Payer: Medicaid Other | Admitting: Licensed Clinical Social Worker

## 2020-02-01 ENCOUNTER — Encounter (HOSPITAL_COMMUNITY): Payer: Self-pay | Admitting: Psychiatry

## 2020-02-01 ENCOUNTER — Ambulatory Visit (INDEPENDENT_AMBULATORY_CARE_PROVIDER_SITE_OTHER): Payer: Medicaid Other | Admitting: Psychiatry

## 2020-02-01 ENCOUNTER — Other Ambulatory Visit: Payer: Self-pay

## 2020-02-01 DIAGNOSIS — G4700 Insomnia, unspecified: Secondary | ICD-10-CM | POA: Diagnosis not present

## 2020-02-01 DIAGNOSIS — F203 Undifferentiated schizophrenia: Secondary | ICD-10-CM

## 2020-02-01 DIAGNOSIS — F331 Major depressive disorder, recurrent, moderate: Secondary | ICD-10-CM

## 2020-02-01 DIAGNOSIS — F201 Disorganized schizophrenia: Secondary | ICD-10-CM

## 2020-02-01 DIAGNOSIS — F913 Oppositional defiant disorder: Secondary | ICD-10-CM

## 2020-02-01 MED ORDER — OLANZAPINE 20 MG PO TABS: 20.0000 mg | ORAL_TABLET | Freq: Every day | ORAL | 0 refills | Status: DC

## 2020-02-01 MED ORDER — BENZTROPINE MESYLATE 1 MG PO TABS: 1.0000 mg | ORAL_TABLET | Freq: Two times a day (BID) | ORAL | 0 refills | Status: DC

## 2020-02-01 NOTE — Progress Notes (Signed)
Virtual Visit via Telephone Note  I connected with Natalie Wagner on 02/01/20 at 10:00 AM EDT by telephone and verified that I am speaking with the correct person using two identifiers.  Location: Patient: home Provider: office   I discussed the limitations, risks, security and privacy concerns of performing an evaluation and management service by telephone and the availability of in person appointments. I also discussed with the patient that there may be a patient responsible charge related to this service. The patient expressed understanding and agreed to proceed.   History of Present Illness: Natalie Wagner reports she is doing well. She is not motivated to exercise and has not made any diet changes. She feels down 1-2 days a week. On those days she thinks about the past. She finds that if she distracts and stay positive she feels better. She will watch tv a lot. Natalie Wagner denies SI/HI. Her sleep is good and wakes up rested. She rarely has AH anymore now that she is taking her meds at the same time daily. When she does hear the voices they tell her hurt or kill herself. She ignores them. Natalie Wagner notes her anxiety is better but at times it acts up. 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 It can happen 1-3x/week. She is working on Radiographer, therapeutic in therapy.    Observations/Objective:  General Appearance: unable to assess  Eye Contact:  unable to assess  Speech:  Clear and Coherent and Normal Rate  Volume:  Normal  Mood:  Euthymic  Affect:  Blunt  Thought Process:  Coherent, Linear and Descriptions of Associations: Intact  Orientation:  Full (Time, Place, and Person)  Thought Content:  Logical  Suicidal Thoughts:  No  Homicidal Thoughts:  No  Memory:  Immediate;   Good  Judgement:  Fair  Insight:  Fair  Psychomotor Activity: unable to assess  Concentration:  Concentration: Good  Recall:  Good  Fund of Knowledge:  Good  Language:  Good  Akathisia:  unable to assess  Handed:  Right  AIMS (if  indicated):     Assets:  Communication Skills Desire for Improvement Financial Resources/Insurance Housing Social Support Talents/Skills  ADL's:  unable to assess  Cognition:  WNL  Sleep:         Assessment and Plan: Schizophrenia- undifferented type; MDD- recurrent, moderate; ODD; Insomnia; r/o eating disorder  Status of current symptoms: stable  Zyprexa 20mg  po qHS Cogentin 1mg  po BID  Encouraged to continue therapy  Follow Up Instructions:  in 2 months or sooner if needed   I discussed the assessment and treatment plan with the patient. The patient was provided an opportunity to ask questions and all were answered. The patient agreed with the plan and demonstrated an understanding of the instructions.   The patient was advised to call back or seek an in-person evaluation if the symptoms worsen or if the condition fails to improve as anticipated.  I provided 20 minutes of non-face-to-face time during this encounter.   [name], MD

## 2020-02-19 ENCOUNTER — Other Ambulatory Visit: Payer: Self-pay

## 2020-02-19 ENCOUNTER — Encounter (HOSPITAL_COMMUNITY): Payer: Self-pay | Admitting: Licensed Clinical Social Worker

## 2020-02-19 ENCOUNTER — Ambulatory Visit (INDEPENDENT_AMBULATORY_CARE_PROVIDER_SITE_OTHER): Payer: Medicaid Other | Admitting: Licensed Clinical Social Worker

## 2020-02-19 DIAGNOSIS — F201 Disorganized schizophrenia: Secondary | ICD-10-CM | POA: Diagnosis not present

## 2020-02-19 NOTE — Progress Notes (Signed)
Virtual Visit via Video Note  I connected with Natalie Wagner on 02/19/20 at  1:30 PM EDT by a video enabled telemedicine application and verified that I am speaking with the correct person using two identifiers.    I discussed the limitations of evaluation and management by telemedicine and the availability of in person appointments. The patient expressed understanding and agreed to proceed.  Type of Therapy: Individual Therapy   Treatment Goals addressed: "I want to work on my anxiety, depression, self-confidence, and my ability to socialize"   Interventions: CBT  Summary: Natalie Wagner is a 24 y.o. female who presents with Schizophrenia, disorganized type   Suicidal/Homicidal: No -without intent/plan   Therapist Response:   Natalie Wagner met with clinician for an individual session. Natalie Wagner discussed her psychiatric symptoms, her current life events and her homework. Natalie Wagner reports she is doing fine at home. Clinician explored sxs and noted that anxiety continues to be present in small episodes. Clinician provided an online anxiety workbook for Natalie Wagner to review, including some journaling and CBT psychoeducation. Clinician discussed interactions and activities. Clinician utilized CBT to identify triggers and coping skills. Clinician also encouraged better self care, including getting out of the house, exercising, eating healthier.      Plan: Return again in 4 weeks   Diagnosis: Axis I: Schizophrenia, disorganized type           I discussed the assessment and treatment plan with the patient. The patient was provided an opportunity to ask questions and all were answered. The patient agreed with the plan and demonstrated an understanding of the instructions.   The patient was advised to call back or seek an in-person evaluation if the symptoms worsen or if the condition fails to improve as anticipated.  I provided 45 minutes of non-face-to-face time during this  encounter.   Natalie Curling, LCSW

## 2020-03-20 ENCOUNTER — Other Ambulatory Visit: Payer: Self-pay

## 2020-03-20 ENCOUNTER — Encounter (HOSPITAL_COMMUNITY): Payer: Self-pay | Admitting: Licensed Clinical Social Worker

## 2020-03-20 ENCOUNTER — Ambulatory Visit (INDEPENDENT_AMBULATORY_CARE_PROVIDER_SITE_OTHER): Payer: Medicaid Other | Admitting: Licensed Clinical Social Worker

## 2020-03-20 DIAGNOSIS — F201 Disorganized schizophrenia: Secondary | ICD-10-CM | POA: Diagnosis not present

## 2020-03-20 NOTE — Progress Notes (Signed)
Virtual Visit via Video Note  I connected with Natalie Wagner on 03/20/20 at  1:30 PM EDT by a video enabled telemedicine application and verified that I am speaking with the correct person using two identifiers.     I discussed the limitations of evaluation and management by telemedicine and the availability of in person appointments. The patient expressed understanding and agreed to proceed.  Type of Therapy: Individual Therapy   Treatment Goals addressed: "I want to work on my anxiety, depression, self-confidence, and my ability to socialize"   Interventions: CBT  Summary: Natalie Wagner is a 24 y.o. female who presents with Schizophrenia, disorganized type   Suicidal/Homicidal: No -without intent/plan   Therapist Response:   Elliott met with clinician for an individual session. Lynnel discussed her psychiatric symptoms, her current life events and her homework. Brindley reports she continues to experience depression and anxiety sxs. Clinician utilized CBT to explore triggers to anxiety, behaviors, thoughts, and physical responses. Clinician provided coping skills including grounding and mindfulness in order to assist Natalie Wagner in relaxing and reducing anxiety. Clinician explored activity level and encouraged increased exercise/movement on a daily basis to burn off some nervous energy. Clinician also processed through fears of COVID and worries about getting the vaccine.     Plan: Return again in 4 weeks   Diagnosis: Axis I: Schizophrenia, disorganized type    I discussed the assessment and treatment plan with the patient. The patient was provided an opportunity to ask questions and all were answered. The patient agreed with the plan and demonstrated an understanding of the instructions.   The patient was advised to call back or seek an in-person evaluation if the symptoms worsen or if the condition fails to improve as anticipated.  I provided 45 minutes of non-face-to-face  time during this encounter.   Natalie Curling, LCSW

## 2020-03-28 ENCOUNTER — Telehealth (INDEPENDENT_AMBULATORY_CARE_PROVIDER_SITE_OTHER): Payer: Medicaid Other | Admitting: Psychiatry

## 2020-03-28 ENCOUNTER — Other Ambulatory Visit: Payer: Self-pay

## 2020-03-28 ENCOUNTER — Encounter (HOSPITAL_COMMUNITY): Payer: Self-pay | Admitting: Psychiatry

## 2020-03-28 DIAGNOSIS — F201 Disorganized schizophrenia: Secondary | ICD-10-CM | POA: Diagnosis not present

## 2020-03-28 MED ORDER — OLANZAPINE 20 MG PO TABS: 20.0000 mg | ORAL_TABLET | Freq: Every day | ORAL | 0 refills | Status: DC

## 2020-03-28 MED ORDER — BENZTROPINE MESYLATE 1 MG PO TABS: 1.0000 mg | ORAL_TABLET | Freq: Two times a day (BID) | ORAL | 0 refills | Status: DC

## 2020-03-28 NOTE — Progress Notes (Signed)
Virtual Visit via Telephone Note  I connected with [name] on 03/28/20 at 10:00 AM EDT by telephone and verified that I am speaking with the correct person using two identifiers.  Location: Patient: home Provider: office   I discussed the limitations, risks, security and privacy concerns of performing an evaluation and management service by telephone and the availability of in person appointments. I also discussed with the patient that there may be a patient responsible charge related to this service. The patient expressed understanding and agreed to proceed.   History of Present Illness: Gulianna states her depression is getting better slowly. Clancey feels happier. She denies SI/HI. Her anxiety is getting worse. She is over thinking and having racing thoughts. Her sleep is good and she is getting 8-9 hrs/night. Therapy is going well. Her paranoia comes and goes. She denies AVH. Aelin feels the improvement is due to taking her meds at the same time everyday. She tries to keep busy at the house which also helps. Her appetite is increased and she is eating 2 meals and snacks a day.    Observations/Objective:  General Appearance: unable to assess  Eye Contact:  unable to assess  Speech:  Clear and Coherent and Normal Rate  Volume:  Normal  Mood:  Anxious  Affect:  Congruent  Thought Process:  Goal Directed, Linear and Descriptions of Associations: Intact  Orientation:  Full (Time, Place, and Person)  Thought Content:  Logical  Suicidal Thoughts:  No  Homicidal Thoughts:  No  Memory:  Immediate;   Good  Judgement:  Fair  Insight:  Fair  Psychomotor Activity: unable to assess  Concentration:  Concentration: Good  Recall:  Good  Fund of Knowledge:  Good  Language:  Good  Akathisia:  unable to assess  Handed:  Right  AIMS (if indicated):     Assets:  Communication Skills Desire for Improvement Financial Resources/Insurance Housing Social  Support Talents/Skills Transportation  ADL's:  unable to assess  Cognition:  WNL  Sleep:         Assessment and Plan: Schizophrenia- undifferented type; MDD- recurrent, moderate; ODD; insomnia; r/o eating disorder  Zyprexa 20mg  po qHS Cogentin 1mg  po BID  Pt feels the therapy is helping.  Labs: ordered  CBC, CMP, HbA1c, Lipid panel, TSH, Prolactin level *Please call the clinic and schedule a nursing appointment to draw labs*  Follow Up Instructions: In 2-3 months or sooner if needed   I discussed the assessment and treatment plan with the patient. The patient was provided an opportunity to ask questions and all were answered. The patient agreed with the plan and demonstrated an understanding of the instructions.   The patient was advised to call back or seek an in-person evaluation if the symptoms worsen or if the condition fails to improve as anticipated.  I provided 20 minutes of non-face-to-face time during this encounter.   [name], MD

## 2020-03-29 ENCOUNTER — Telehealth (HOSPITAL_COMMUNITY): Payer: Self-pay | Admitting: *Deleted

## 2020-03-29 ENCOUNTER — Other Ambulatory Visit (HOSPITAL_COMMUNITY): Payer: Self-pay | Admitting: *Deleted

## 2020-03-29 DIAGNOSIS — Z79899 Other long term (current) drug therapy: Secondary | ICD-10-CM

## 2020-03-29 NOTE — Telephone Encounter (Signed)
Writer spoke with pt to inform her that lab orders have been sent to [facility] at [place]. Pt verbalizes understanding.

## 2020-04-02 LAB — CBC WITH DIFFERENTIAL/PLATELET
Basophils Absolute: 0 10*3/uL (ref 0.0–0.2)
Basos: 0 %
EOS (ABSOLUTE): 0 10*3/uL (ref 0.0–0.4)
Eos: 0 %
Hematocrit: 39.4 % (ref 34.0–46.6)
Hemoglobin: 12.6 g/dL (ref 11.1–15.9)
Immature Grans (Abs): 0 10*3/uL (ref 0.0–0.1)
Immature Granulocytes: 0 %
Lymphocytes Absolute: 1.8 10*3/uL (ref 0.7–3.1)
Lymphs: 28 %
MCH: 29.2 pg (ref 26.6–33.0)
MCHC: 32 g/dL (ref 31.5–35.7)
MCV: 91 fL (ref 79–97)
Monocytes Absolute: 0.5 10*3/uL (ref 0.1–0.9)
Monocytes: 7 %
Neutrophils Absolute: 4.2 10*3/uL (ref 1.4–7.0)
Neutrophils: 65 %
Platelets: 442 10*3/uL (ref 150–450)
RBC: 4.32 x10E6/uL (ref 3.77–5.28)
RDW: 11.7 % (ref 11.7–15.4)
WBC: 6.5 10*3/uL (ref 3.4–10.8)

## 2020-04-02 LAB — CMP14+EGFR
ALT: 13 IU/L (ref 0–32)
AST: 18 IU/L (ref 0–40)
Albumin/Globulin Ratio: 1.7 (ref 1.2–2.2)
Albumin: 4.7 g/dL (ref 3.9–5.0)
Alkaline Phosphatase: 99 IU/L (ref 48–121)
BUN/Creatinine Ratio: 9 (ref 9–23)
BUN: 8 mg/dL (ref 6–20)
Bilirubin Total: 0.5 mg/dL (ref 0.0–1.2)
CO2: 23 mmol/L (ref 20–29)
Calcium: 9.5 mg/dL (ref 8.7–10.2)
Chloride: 101 mmol/L (ref 96–106)
Creatinine, Ser: 0.89 mg/dL (ref 0.57–1.00)
GFR calc Af Amer: 106 mL/min/{1.73_m2} (ref >59)
GFR calc non Af Amer: 92 mL/min/{1.73_m2} (ref >59)
Globulin, Total: 2.7 g/dL (ref 1.5–4.5)
Glucose: 88 mg/dL (ref 65–99)
Potassium: 4.5 mmol/L (ref 3.5–5.2)
Sodium: 139 mmol/L (ref 134–144)
Total Protein: 7.4 g/dL (ref 6.0–8.5)

## 2020-04-02 LAB — LIPID PANEL
Chol/HDL Ratio: 5.5 ratio — ABNORMAL HIGH (ref 0.0–4.4)
Cholesterol, Total: 202 mg/dL — ABNORMAL HIGH (ref 100–199)
HDL: 37 mg/dL — ABNORMAL LOW (ref >39)
LDL Chol Calc (NIH): 150 mg/dL — ABNORMAL HIGH (ref 0–99)
Triglycerides: 81 mg/dL (ref 0–149)
VLDL Cholesterol Cal: 15 mg/dL (ref 5–40)

## 2020-04-02 LAB — HEMOGLOBIN A1C
Est. average glucose Bld gHb Est-mCnc: 108 mg/dL
Hgb A1c MFr Bld: 5.4 % (ref 4.8–5.6)

## 2020-04-02 LAB — PROLACTIN: Prolactin: 61.2 ng/mL — ABNORMAL HIGH (ref 4.8–23.3)

## 2020-04-02 LAB — TSH: TSH: 1.35 u[IU]/mL (ref 0.450–4.500)

## 2020-04-04 ENCOUNTER — Other Ambulatory Visit (HOSPITAL_COMMUNITY): Payer: Self-pay | Admitting: *Deleted

## 2020-04-04 DIAGNOSIS — F201 Disorganized schizophrenia: Secondary | ICD-10-CM

## 2020-04-04 MED ORDER — OLANZAPINE 20 MG PO TBDP: 20.0000 mg | ORAL_TABLET | Freq: Every day | ORAL | 0 refills | Status: DC

## 2020-04-10 IMAGING — DX DG CHEST 1V PORT
1 series · 1 of 1 positions shown · non-contrast
Comparison: No pertinent prior studies available for comparison.

CLINICAL DATA: Cough. Additional history provided: Cough, chest
tightness, shortness of breath

EXAM:
PORTABLE CHEST 1 VIEW

[chest ap]
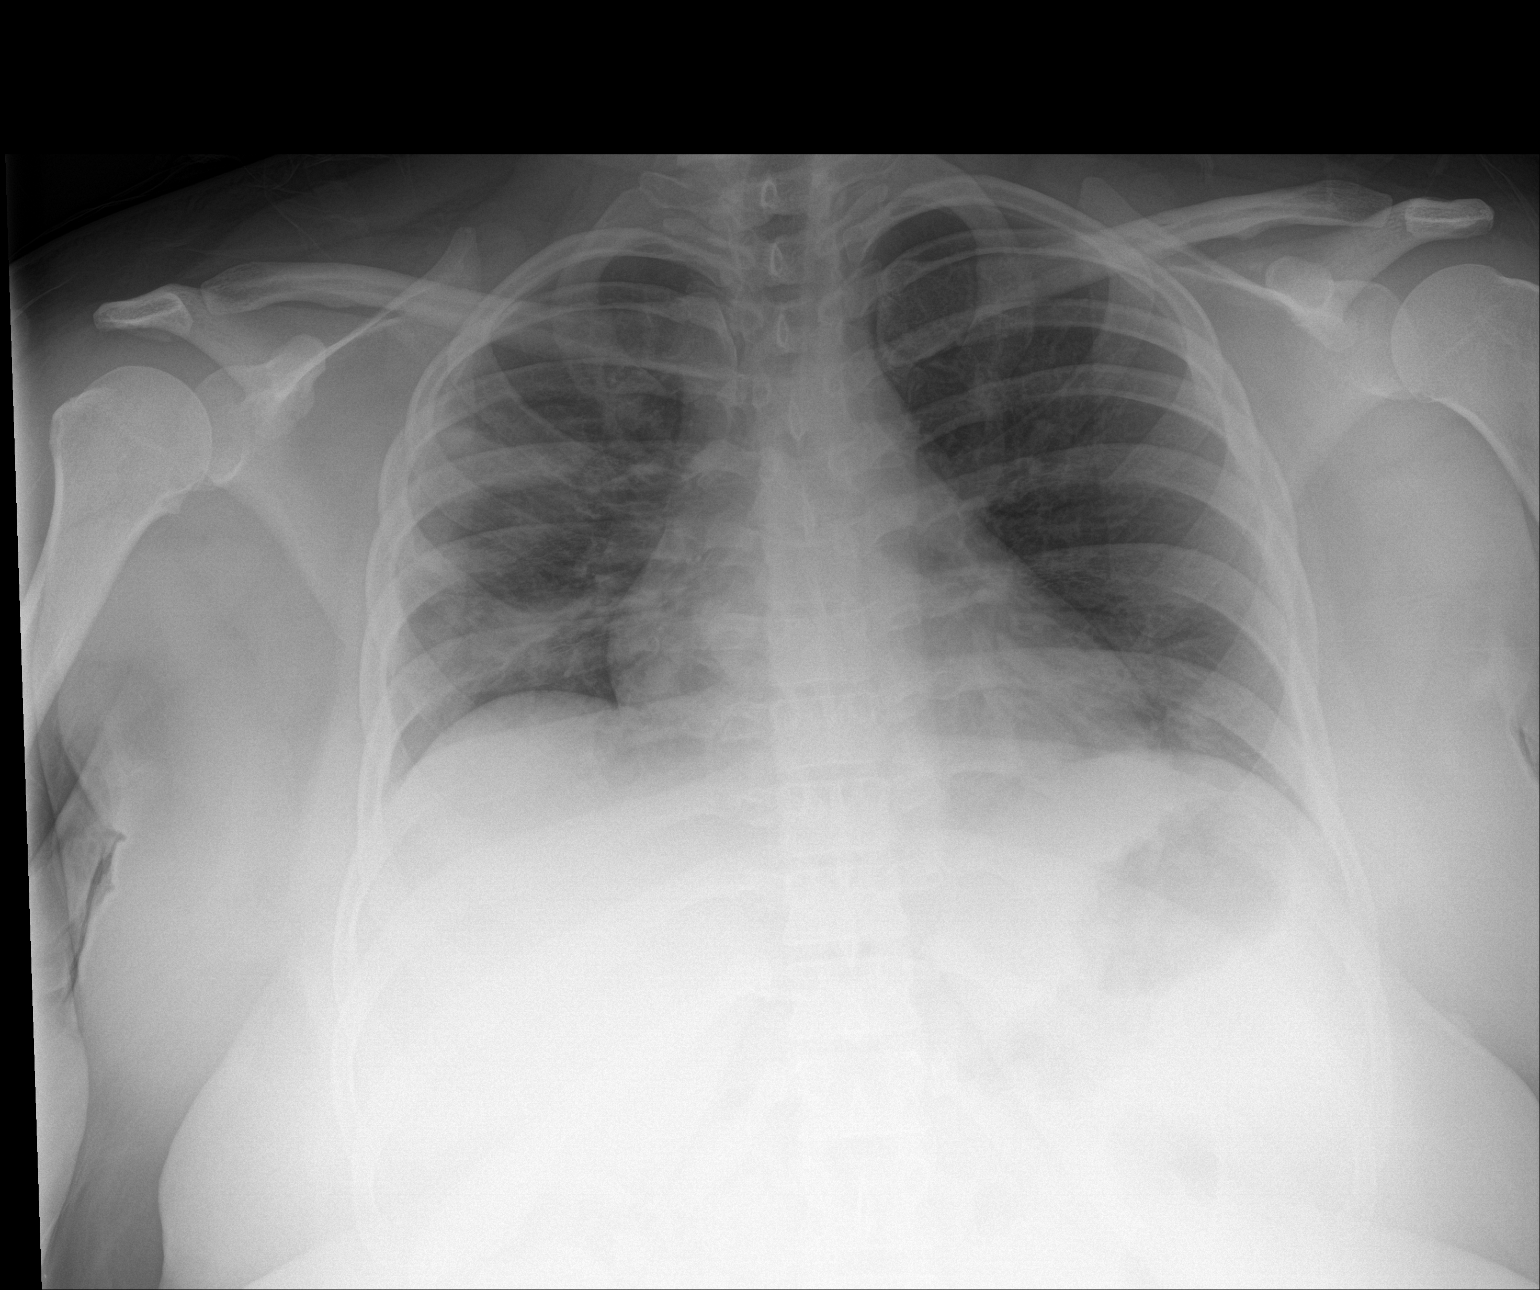

[1 of 1 positions shown; findings below may reference images not displayed]

FINDINGS: Heart size within normal limits. Shallow inspiration radiograph.
Multifocal airspace opacities within the right lung consistent with
pneumonia. Subtle airspace disease is also questioned within the
left lung base. No evidence of pleural effusion or pneumothorax. No
acute bony abnormality.
IMPRESSION: Shallow inspiration radiograph.

Multifocal airspace opacities within the right lung consistent with
pneumonia. Subtle airspace disease is also questioned within the
left lung base.

## 2020-04-18 ENCOUNTER — Ambulatory Visit (INDEPENDENT_AMBULATORY_CARE_PROVIDER_SITE_OTHER): Payer: Medicaid Other | Admitting: Licensed Clinical Social Worker

## 2020-04-18 ENCOUNTER — Other Ambulatory Visit: Payer: Self-pay

## 2020-04-18 ENCOUNTER — Encounter (HOSPITAL_COMMUNITY): Payer: Self-pay | Admitting: Licensed Clinical Social Worker

## 2020-04-18 DIAGNOSIS — F201 Disorganized schizophrenia: Secondary | ICD-10-CM

## 2020-04-18 NOTE — Progress Notes (Signed)
Virtual Visit via Video Note  I connected with Natalie Wagner on 04/18/20 at 10:00 AM EDT by a video enabled telemedicine application and verified that I am speaking with the correct person using two identifiers.  Location: Patient: home Provider: office   I discussed the limitations of evaluation and management by telemedicine and the availability of in person appointments. The patient expressed understanding and agreed to proceed.  Type of Therapy: Individual Therapy   Treatment Goals addressed: "I want to work on my anxiety, depression, self-confidence, and my ability to socialize"   Interventions: CBT  Summary: Natalie Wagner is a 24 y.o. female who presents with Schizophrenia, disorganized type   Suicidal/Homicidal: No -without intent/plan   Therapist Response:   Natalie Wagner met with clinician for an individual session. Natalie Wagner discussed her psychiatric symptoms, her current life events and her homework. Natalie Wagner reports she has been okay. She identified depressed mood a few days and some anxiety. Clinician utilized CBT to explore triggers, thoughts, feelings and behaviors. Clinician identified coping skills and explored ways for Natalie Wagner to do something that has some meaning. Clinician provided options for learning new art techniques or projects on Pinterest or new recipes to cook. Clinician also explored health concerns and encouraged daily exercise for both physical and mental health.      Plan: Return again in 4 weeks   Diagnosis: Axis I: Schizophrenia, disorganized type       I discussed the assessment and treatment plan with the patient. The patient was provided an opportunity to ask questions and all were answered. The patient agreed with the plan and demonstrated an understanding of the instructions.   The patient was advised to call back or seek an in-person evaluation if the symptoms worsen or if the condition fails to improve as anticipated.  I provided 45 minutes  of non-face-to-face time during this encounter.   [name], LCSW

## 2020-04-26 ENCOUNTER — Ambulatory Visit: Payer: Self-pay | Admitting: Family Medicine

## 2020-04-29 ENCOUNTER — Encounter: Payer: Self-pay | Admitting: Family Medicine

## 2020-04-29 ENCOUNTER — Other Ambulatory Visit: Payer: Self-pay

## 2020-04-29 ENCOUNTER — Ambulatory Visit (INDEPENDENT_AMBULATORY_CARE_PROVIDER_SITE_OTHER): Payer: Medicaid Other | Admitting: Family Medicine

## 2020-04-29 DIAGNOSIS — E785 Hyperlipidemia, unspecified: Secondary | ICD-10-CM

## 2020-04-29 NOTE — Patient Instructions (Addendum)
Thank you for coming to see me today. It was a pleasure! Today we talked about:   Please try to exercise regularly. They recommend at least 150 minutes per week of cardio exercise. I have attached information on the [place] diet below.   If you would like any more information on the covid vaccine you may got to [email or web address] or refer to the [facility] website for any questions.   Please follow-up with our clinic in 1 year or sooner as needed. Please schedule your pap-smear.   If you have any questions or concerns, please do not hesitate to call the office at [phone].  Take Care,   [place] Sharissa Brierley, DO  [place] refers to food and lifestyle choices that are based on the traditions of countries located on the [employer]. This way of eating has been shown to help prevent certain conditions and improve outcomes for people who have chronic diseases, like kidney disease and heart disease. What are tips for following this plan? Lifestyle  Cook and eat meals together with your family, when possible.  Drink enough fluid to keep your urine clear or pale yellow.  Be physically active every day. This includes: ? Aerobic exercise like running or swimming. ? Leisure activities like gardening, walking, or housework.  Get 7-8 hours of sleep each night.  If recommended by your health care provider, drink red wine in moderation. This means 1 glass a day for nonpregnant women and 2 glasses a day for men. A glass of wine equals 5 oz (150 mL). Reading food labels   Check the serving size of packaged foods. For foods such as rice and pasta, the serving size refers to the amount of cooked product, not dry.  Check the total fat in packaged foods. Avoid foods that have saturated fat or trans fats.  Check the ingredients list for added sugars, such as corn syrup. Shopping  At the grocery store, buy most of your food from the areas near the walls of the store.  This includes: ? Fresh fruits and vegetables (produce). ? Grains, beans, nuts, and seeds. Some of these may be available in unpackaged forms or large amounts (in bulk). ? Fresh seafood. ? Poultry and eggs. ? Low-fat dairy products.  Buy whole ingredients instead of prepackaged foods.  Buy fresh fruits and vegetables in-season from local farmers markets.  Buy frozen fruits and vegetables in resealable bags.  If you do not have access to quality fresh seafood, buy precooked frozen shrimp or canned fish, such as tuna, salmon, or sardines.  Buy small amounts of raw or cooked vegetables, salads, or olives from the deli or salad bar at your store.  Stock your pantry so you always have certain foods on hand, such as olive oil, canned tuna, canned tomatoes, rice, pasta, and beans. Cooking  Cook foods with extra-virgin olive oil instead of using butter or other vegetable oils.  Have meat as a side dish, and have vegetables or grains as your main dish. This means having meat in small portions or adding small amounts of meat to foods like pasta or stew.  Use beans or vegetables instead of meat in common dishes like chili or lasagna.  Experiment with different cooking methods. Try roasting or broiling vegetables instead of steaming or sauteing them.  Add frozen vegetables to soups, stews, pasta, or rice.  Add nuts or seeds for added healthy fat at each meal. You can add these to yogurt, salads, or vegetable dishes.  Marinate fish or vegetables using olive oil, lemon juice, garlic, and fresh herbs. Meal planning   Plan to eat 1 vegetarian meal one day each week. Try to work up to 2 vegetarian meals, if possible.  Eat seafood 2 or more times a week.  Have healthy snacks readily available, such as: ? Vegetable sticks with hummus. ? [place] yogurt. ? Fruit and nut trail mix.  Eat balanced meals throughout the week. This includes: ? Fruit: 2-3 servings a day ? Vegetables: 4-5 servings a  day ? Low-fat dairy: 2 servings a day ? Fish, poultry, or lean meat: 1 serving a day ? Beans and legumes: 2 or more servings a week ? Nuts and seeds: 1-2 servings a day ? Whole grains: 6-8 servings a day ? Extra-virgin olive oil: 3-4 servings a day  Limit red meat and sweets to only a few servings a month What are my food choices?  Mediterranean diet ? Recommended  Grains: Whole-grain pasta. Brown rice. Bulgar wheat. Polenta. Couscous. Whole-wheat bread. [place].  Vegetables: Artichokes. Beets. Broccoli. Cabbage. Carrots. Eggplant. Green beans. Chard. Kale. Spinach. Onions. Leeks. Peas. Squash. Tomatoes. Peppers. Radishes.  Fruits: Apples. Apricots. Avocado. Berries. Bananas. Cherries. Dates. Figs. Grapes. Lemons. Melon. Oranges. Peaches. Plums. Pomegranate.  Meats and other protein foods: Beans. Almonds. Sunflower seeds. Pine nuts. Peanuts. [place]. Salmon. Scallops. Shrimp. [place]. Tilapia. Clams. Oysters. Eggs.  Dairy: Low-fat milk. Cheese. Greek yogurt.  Beverages: Water. Red wine. Herbal tea.  Fats and oils: Extra virgin olive oil. Avocado oil. Grape seed oil.  Sweets and desserts: [place] yogurt with honey. Baked apples. Poached pears. Trail mix.  Seasoning and other foods: Basil. Cilantro. Coriander. Cumin. Mint. Parsley. Sage. Rosemary. Tarragon. Garlic. Oregano. Thyme. Pepper. Balsalmic vinegar. Tahini. Hummus. Tomato sauce. Olives. Mushrooms. ? Limit these  Grains: Prepackaged pasta or rice dishes. Prepackaged cereal with added sugar.  Vegetables: Deep fried potatoes (french fries).  Fruits: Fruit canned in syrup.  Meats and other protein foods: Beef. Pork. Lamb. Poultry with skin. Hot dogs. [place] Salines.  Dairy: Ice cream. Sour cream. Whole milk.  Beverages: Juice. Sugar-sweetened soft drinks. Beer. Liquor and spirits.  Fats and oils: Butter. Canola oil. Vegetable oil. Beef fat (tallow). Lard.  Sweets and desserts: Cookies. Cakes. Pies. Candy.  Seasoning and other  foods: Mayonnaise. Premade sauces and marinades. The items listed may not be a complete list. Talk with your dietitian about what dietary choices are right for you. Summary  The Mediterranean diet includes both food and lifestyle choices.  Eat a variety of fresh fruits and vegetables, beans, nuts, seeds, and whole grains.  Limit the amount of red meat and sweets that you eat.  Talk with your health care provider about whether it is safe for you to drink red wine in moderation. This means 1 glass a day for nonpregnant women and 2 glasses a day for men. A glass of wine equals 5 oz (150 mL). This information is not intended to replace advice given to you by your health care provider. Make sure you discuss any questions you have with your health care provider. Document Revised: 06/25/2016 Document Reviewed: 06/18/2016 Elsevier Patient Education  [place].

## 2020-04-29 NOTE — Progress Notes (Signed)
   SUBJECTIVE:   CHIEF COMPLAINT / HPI:   Elevated cholesterol: Patient reports that her behavioral health physician got screening labs and she was noted to have elevated cholesterol. Patient is here today because she does not know if she needs to be on any medication for this or what she needs to do. She reports that she does not have any known family history of someone having a heart attack or sudden death before the age of 63. Mom reports that maternal grandfather had cardiac and respiratory arrest at age 67 but did have a history of heart failure. Patient herself denies any chest pain, shortness of breath, palpitations. She states that she has been increasing her exercise recently.  PERTINENT  PMH / PSH: Bipolar disorder  OBJECTIVE:  BP (!) 94/56   Pulse 83   Ht 4\' 11"  (1.499 m)   Wt 180 lb (81.6 kg)   LMP 04/09/2020   SpO2 99%   BMI 36.36 kg/m   General: NAD, pleasant Neck: Supple Cardiovascular: RRR, no m/r/g Respiratory: CTA BL, normal work of breathing Psych: AOx3, appropriate affect  ASSESSMENT/PLAN:   Hyperlipidemia LDL goal <130 Patient with elevated LDL to 150. No family history of sudden cardiac death and patient currently asymptomatic. Discussed options including starting statin therapy, however given patient's low risk discussed lifestyle changes by adhering to a diet such as the Mediterranean diet. Also encouraged regular exercise. Patient to have repeat labs in 2 to 4 years depending on provider preference. Patient reassured and is comfortable without starting medications at this time.    [place] Irma Roulhac, DO PGY-3, [facility]

## 2020-05-02 ENCOUNTER — Encounter (HOSPITAL_COMMUNITY): Payer: Self-pay | Admitting: Licensed Clinical Social Worker

## 2020-05-02 ENCOUNTER — Ambulatory Visit (INDEPENDENT_AMBULATORY_CARE_PROVIDER_SITE_OTHER): Payer: Medicaid Other | Admitting: Licensed Clinical Social Worker

## 2020-05-02 ENCOUNTER — Other Ambulatory Visit: Payer: Self-pay

## 2020-05-02 DIAGNOSIS — F201 Disorganized schizophrenia: Secondary | ICD-10-CM

## 2020-05-02 NOTE — Assessment & Plan Note (Signed)
Patient with elevated LDL to 150. No family history of sudden cardiac death and patient currently asymptomatic. Discussed options including starting statin therapy, however given patient's low risk discussed lifestyle changes by adhering to a diet such as the Mediterranean diet. Also encouraged regular exercise. Patient to have repeat labs in 2 to 4 years depending on provider preference. Patient reassured and is comfortable without starting medications at this time.

## 2020-05-02 NOTE — Progress Notes (Signed)
Virtual Visit via Video Note  I connected with Natalie Wagner on 05/02/20 at 11:00 AM EDT by a video enabled telemedicine application and verified that I am speaking with the correct person using two identifiers.  Location: Patient: home Provider: office   I discussed the limitations of evaluation and management by telemedicine and the availability of in person appointments. The patient expressed understanding and agreed to proceed.  Type of Therapy: Individual Therapy   Treatment Goals addressed: "I want to work on my anxiety, depression, self-confidence, and my ability to socialize"   Interventions: CBT  Summary: Natalie Wagner is a 24 y.o. female who presents with Schizophrenia, disorganized type   Suicidal/Homicidal: No -without intent/plan   Therapist Response:   Natalie Wagner met with clinician for an individual session. Natalie Wagner discussed her psychiatric symptoms, her current life events and her homework. Natalie Wagner reports she has been concerned about her physical health due to recent blood work and weight check. Clinician processed thoughts, feelings, and behaviors due to these concerns using CBT. Clinician explored what Natalie Wagner is willing to do in order to change her eating habits or exercise regimen. Clinician discussed ways to incorporate more vegetables and lean meats into her diet, reduce sugar, and increase daily movement. Clinician processed feelings of shame and disappointment in herself for her current state of health. Clinician validated concerns, but also identified that there is some hope to make changes by eating healthier and exercising more. Clinician also identified exercise as a positive coping skill for depression and anxiety.     Plan: Return again in 1-2 weeks   Diagnosis: Axis I: Schizophrenia, disorganized type    I discussed the assessment and treatment plan with the patient. The patient was provided an opportunity to ask questions and all were answered. The  patient agreed with the plan and demonstrated an understanding of the instructions.   The patient was advised to call back or seek an in-person evaluation if the symptoms worsen or if the condition fails to improve as anticipated.  I provided 45 minutes of non-face-to-face time during this encounter.   [name], LCSW

## 2020-05-09 ENCOUNTER — Ambulatory Visit (INDEPENDENT_AMBULATORY_CARE_PROVIDER_SITE_OTHER): Payer: Medicaid Other | Admitting: Licensed Clinical Social Worker

## 2020-05-09 ENCOUNTER — Encounter (HOSPITAL_COMMUNITY): Payer: Self-pay | Admitting: Licensed Clinical Social Worker

## 2020-05-09 ENCOUNTER — Other Ambulatory Visit: Payer: Self-pay

## 2020-05-09 DIAGNOSIS — F201 Disorganized schizophrenia: Secondary | ICD-10-CM

## 2020-05-09 NOTE — Progress Notes (Signed)
Virtual Visit via Video Note  I connected with Natalie Wagner on 05/09/20 at 11:00 AM EDT by a video enabled telemedicine application and verified that I am speaking with the correct person using two identifiers.  Location: Patient: home Provider: office   I discussed the limitations of evaluation and management by telemedicine and the availability of in person appointments. The patient expressed understanding and agreed to proceed.  Type of Therapy: Individual Therapy   Treatment Goals addressed: "I want to work on my anxiety, depression, self-confidence, and my ability to socialize"   Interventions: CBT  Summary: Natalie Wagner is a 24 y.o. female who presents with Schizophrenia, disorganized type   Suicidal/Homicidal: No -without intent/plan   Therapist Response:   Jazzmyn met with clinician for an individual session. Akaya discussed her psychiatric symptoms, her current life events and her homework. Chonita reports she has incorporated more self care habits in her daily routine, such as eating healthier and exercising, getting out of the house, and increasing positive self talk. Clinician utilized CBT to explore thoughts, feelings, and behaviors that make this journey easier. Clinician explored anxiety sxs and agreed to send on more work sheets for processing anxiety, treatment, and self examination for anxety triggers and reactions. Clinician discussed the importance of being okay with who she is and increasing her self esteem.      Plan: Return again in 2-3 weeks   Diagnosis: Axis I: Schizophrenia, disorganized type  I discussed the assessment and treatment plan with the patient. The patient was provided an opportunity to ask questions and all were answered. The patient agreed with the plan and demonstrated an understanding of the instructions.   The patient was advised to call back or seek an in-person evaluation if the symptoms worsen or if the condition fails to  improve as anticipated.  I provided 45 minutes of non-face-to-face time during this encounter.   [name], LCSW

## 2020-05-29 ENCOUNTER — Ambulatory Visit (INDEPENDENT_AMBULATORY_CARE_PROVIDER_SITE_OTHER): Payer: Medicaid Other | Admitting: Licensed Clinical Social Worker

## 2020-05-29 ENCOUNTER — Encounter (HOSPITAL_COMMUNITY): Payer: Self-pay | Admitting: Licensed Clinical Social Worker

## 2020-05-29 ENCOUNTER — Other Ambulatory Visit: Payer: Self-pay

## 2020-05-29 DIAGNOSIS — F201 Disorganized schizophrenia: Secondary | ICD-10-CM

## 2020-05-29 NOTE — Progress Notes (Signed)
Virtual Visit via Video Note  I connected with [name] on 05/29/20 at  2:30 PM EDT by a video enabled telemedicine application and verified that I am speaking with the correct person using two identifiers.  Location: Patient: home Provider: office   I discussed the limitations of evaluation and management by telemedicine and the availability of in person appointments. The patient expressed understanding and agreed to proceed.  Type of Therapy: Individual Therapy   Treatment Goals addressed: "I want to work on my anxiety, depression, self-confidence, and my ability to socialize"   Interventions: CBT  Summary: Von Inscoe is a 24 y.o. female who presents with Schizophrenia, disorganized type   Suicidal/Homicidal: No -without intent/plan   Therapist Response:   Kapri met with clinician for an individual session. Alka discussed her psychiatric symptoms, her current life events and her homework. Darilyn reports she worries about weight gain with her medication, but agreed that it works so well, she will have to adjust her eating. Clinician discussed healthy options for snacks and meals. Clinician utilized CBT to process triggers to hunger: boredom, being in the house, dehydration. Clinician processed healthy choices for cooking, such as baking rather than frying. Clinician explored anxiety and depression sxs. Clinician noted improvement overall in mood and stability in psychotic sxs.      Plan: Return again in 2-3 weeks   Diagnosis: Axis I: Schizophrenia, disorganized type             I discussed the assessment and treatment plan with the patient. The patient was provided an opportunity to ask questions and all were answered. The patient agreed with the plan and demonstrated an understanding of the instructions.   The patient was advised to call back or seek an in-person evaluation if the symptoms worsen or if the condition fails to improve as anticipated.  I  provided 45 minutes of non-face-to-face time during this encounter.   [name], LCSW

## 2020-06-06 ENCOUNTER — Other Ambulatory Visit: Payer: Self-pay

## 2020-06-06 ENCOUNTER — Encounter (HOSPITAL_COMMUNITY): Payer: Self-pay | Admitting: Psychiatry

## 2020-06-06 ENCOUNTER — Telehealth (HOSPITAL_COMMUNITY): Payer: Medicaid Other | Admitting: Psychiatry

## 2020-06-06 DIAGNOSIS — F331 Major depressive disorder, recurrent, moderate: Secondary | ICD-10-CM

## 2020-06-06 DIAGNOSIS — F201 Disorganized schizophrenia: Secondary | ICD-10-CM

## 2020-06-06 DIAGNOSIS — G4700 Insomnia, unspecified: Secondary | ICD-10-CM

## 2020-06-06 MED ORDER — BENZTROPINE MESYLATE 1 MG PO TABS: 1.0000 mg | ORAL_TABLET | Freq: Two times a day (BID) | ORAL | 0 refills | Status: DC

## 2020-06-06 MED ORDER — OLANZAPINE 20 MG PO TBDP: 20.0000 mg | ORAL_TABLET | Freq: Every day | ORAL | 0 refills | Status: DC

## 2020-06-06 NOTE — Progress Notes (Signed)
Virtual Visit via Telephone Note  I connected with Natalie Wagner on 06/06/20 at  1:00 PM EDT by telephone and verified that I am speaking with the correct person using two identifiers.  Location: Patient: home Provider: office   I discussed the limitations, risks, security and privacy concerns of performing an evaluation and management service by telephone and the availability of in person appointments. I also discussed with the patient that there may be a patient responsible charge related to this service. The patient expressed understanding and agreed to proceed.   History of Present Illness: Natalie Wagner states she is doing well. She keeps busy doing chores, tv, coloring or word searches and going out with her mom. It keeps her from getting bored. Natalie Wagner denies depression. She continues to have racing thoughts and worries. Her motivation has improved. She denies SI/HI. Natalie Wagner admits she has paranoia regarding her safety that comes and goes. Natalie Wagner denies any recent AVH. Her sleep is good.    Observations/Objective:  General Appearance: unable to assess  Eye Contact:  unable to assess  Speech:  Clear and Coherent and Normal Rate  Volume:  Normal  Mood:  Euthymic  Affect:  Blunt  Thought Process:  Coherent and Descriptions of Associations: Intact  Orientation:  Full (Time, Place, and Person)  Thought Content:  Paranoid Ideation  Suicidal Thoughts:  No  Homicidal Thoughts:  No  Memory:  Immediate;   Good  Judgement:  Fair  Insight:  Fair  Psychomotor Activity: unable to assess  Concentration:  Concentration: Good  Recall:  Good  Fund of Knowledge:  Good  Language:  Good  Akathisia:  unable to assess  Handed:  Right  AIMS (if indicated):     Assets:  Communication Skills Desire for Improvement Financial Resources/Insurance Housing Social Support Talents/Skills Transportation Vocational/Educational  ADL's:  unable to assess  Cognition:  WNL  Sleep:          Assessment and Plan:  Schizophrenia- undiffernted type; MDD- recurrent, moderate; ODD; Insomnia  Zyprexa 20mg  po qHS Cogentin 1mg  po BID  Encouraged to continue therapy  Reviewed labs done 04/01/20- CMP wnl, CBC  wnl, HbA1c and TSH wnl, Prolactin elevate, lipids elevated  Discussed diet and exercise regarding lipids. She is working with her PCP  Follow Up Instructions: In 2-3 months or sooner if need   I discussed the assessment and treatment plan with the patient. The patient was provided an opportunity to ask questions and all were answered. The patient agreed with the plan and demonstrated an understanding of the instructions.   The patient was advised to call back or seek an in-person evaluation if the symptoms worsen or if the condition fails to improve as anticipated.  I provided 15 minutes of non-face-to-face time during this encounter.   Oletta Darter, MD

## 2020-06-13 ENCOUNTER — Ambulatory Visit (INDEPENDENT_AMBULATORY_CARE_PROVIDER_SITE_OTHER): Payer: Medicaid Other | Admitting: Licensed Clinical Social Worker

## 2020-06-13 ENCOUNTER — Other Ambulatory Visit: Payer: Self-pay

## 2020-06-13 ENCOUNTER — Encounter (HOSPITAL_COMMUNITY): Payer: Self-pay | Admitting: Licensed Clinical Social Worker

## 2020-06-13 DIAGNOSIS — F201 Disorganized schizophrenia: Secondary | ICD-10-CM

## 2020-06-13 NOTE — Progress Notes (Signed)
Virtual Visit via Video Note  I connected with [name] on 06/13/20 at  9:00 AM EDT by a video enabled telemedicine application and verified that I am speaking with the correct person using two identifiers.  Location: Patient: home Provider: office   I discussed the limitations of evaluation and management by telemedicine and the availability of in person appointments. The patient expressed understanding and agreed to proceed.   Type of Therapy: Individual Therapy   Treatment Goals addressed: "I want to work on my anxiety, depression, self-confidence, and my ability to socialize"   Interventions: CBT  Summary: Charlet Harr is a 24 y.o. female who presents with Schizophrenia, disorganized type   Suicidal/Homicidal: No -without intent/plan   Therapist Response:   Vylet met with clinician for an individual session. Pearlina discussed her psychiatric symptoms, her current life events and her homework. Lashae reports her anxiety has been better, but she is starting to feel a little more depressed lately. Clinician utilized CBT to explore triggers to the depressed mood, thoughts, feelings, and behaviors. Clinician identified lack of motivation or lack of purpose as a stressor. Clinician explored options for getting out of the house more, since she has been home for almost 18 months with no significant social interactions outside of family. Clinician discussed options for going to [facility] or a similar program a couple of days per week. Clinician processed concerns about people being mean or not being that friendly to her. Clinician validated concerns, but also identified ways for Labria to meet people and reduce her social anxiety.      Plan: Return again in 2-3 weeks   Diagnosis: Axis I: Schizophrenia, disorganized type  I discussed the assessment and treatment plan with the patient. The patient was provided an opportunity to ask questions and all were answered. The  patient agreed with the plan and demonstrated an understanding of the instructions.   The patient was advised to call back or seek an in-person evaluation if the symptoms worsen or if the condition fails to improve as anticipated.  I provided 45 minutes of non-face-to-face time during this encounter.   [name], LCSW

## 2020-06-14 ENCOUNTER — Ambulatory Visit: Payer: Self-pay | Admitting: Family Medicine

## 2020-07-11 ENCOUNTER — Ambulatory Visit (INDEPENDENT_AMBULATORY_CARE_PROVIDER_SITE_OTHER): Payer: Medicaid Other | Admitting: Licensed Clinical Social Worker

## 2020-07-11 ENCOUNTER — Other Ambulatory Visit: Payer: Self-pay

## 2020-07-11 ENCOUNTER — Encounter (HOSPITAL_COMMUNITY): Payer: Self-pay | Admitting: Licensed Clinical Social Worker

## 2020-07-11 DIAGNOSIS — F201 Disorganized schizophrenia: Secondary | ICD-10-CM

## 2020-07-11 NOTE — Progress Notes (Signed)
Virtual Visit via Video Note  I connected with Natalie Wagner on 07/11/20 at 10:00 AM EDT by a video enabled telemedicine application and verified that I am speaking with the correct person using two identifiers.  Location: Patient: home Provider: office   I discussed the limitations of evaluation and management by telemedicine and the availability of in person appointments. The patient expressed understanding and agreed to proceed.   Type of Therapy: Individual Therapy   Treatment Goals addressed: "I want to work on my anxiety, depression, self-confidence, and my ability to socialize"   Interventions: CBT  Summary: Natalie Wagner is a 24 y.o. female who presents with Schizophrenia, disorganized type   Suicidal/Homicidal: No -without intent/plan   Therapist Response:   Nicole met with clinician for an individual session. Natalie Wagner discussed her psychiatric symptoms, her current life events and her homework. Natalie Wagner reports she has been doing pretty well. She identified improvement in her anxiety. Clinician provided CBT psychoeducation about the "Mammalian Dive Reflex" and encouraged Natalie Wagner to simulate the dive by taking a cold shower or running cold water on her body in order to calm down and reset her mood. Clinician discussed interactions with family and noted some loneliness, as brothers are now in college and sisters live outside of the home. Clinician explored activities that can occupy Natalie Wagner's days and explored options for taking classes.      Plan: Return again in 2-3 weeks   Diagnosis: Axis I: Schizophrenia, disorganized type          I discussed the assessment and treatment plan with the patient. The patient was provided an opportunity to ask questions and all were answered. The patient agreed with the plan and demonstrated an understanding of the instructions.   The patient was advised to call back or seek an in-person evaluation if the symptoms worsen or if the  condition fails to improve as anticipated.  I provided 45 minutes of non-face-to-face time during this encounter.   [name], LCSW

## 2020-08-01 ENCOUNTER — Other Ambulatory Visit: Payer: Self-pay

## 2020-08-01 ENCOUNTER — Telehealth (INDEPENDENT_AMBULATORY_CARE_PROVIDER_SITE_OTHER): Payer: Medicaid Other | Admitting: Psychiatry

## 2020-08-01 DIAGNOSIS — F331 Major depressive disorder, recurrent, moderate: Secondary | ICD-10-CM | POA: Diagnosis not present

## 2020-08-01 DIAGNOSIS — F201 Disorganized schizophrenia: Secondary | ICD-10-CM | POA: Diagnosis not present

## 2020-08-01 DIAGNOSIS — G4700 Insomnia, unspecified: Secondary | ICD-10-CM | POA: Diagnosis not present

## 2020-08-01 MED ORDER — BENZTROPINE MESYLATE 1 MG PO TABS: 1.0000 mg | ORAL_TABLET | Freq: Two times a day (BID) | ORAL | 0 refills | Status: DC

## 2020-08-01 MED ORDER — OLANZAPINE 20 MG PO TBDP: 20.0000 mg | ORAL_TABLET | Freq: Every day | ORAL | 0 refills | Status: DC

## 2020-08-01 NOTE — Progress Notes (Signed)
Virtual Visit via Telephone Note  I connected with Natalie Wagner on 08/01/20 at  2:30 PM EDT by telephone and verified that I am speaking with the correct person using two identifiers.  Location: Patient: home Provider: office   I discussed the limitations, risks, security and privacy concerns of performing an evaluation and management service by telephone and the availability of in person appointments. I also discussed with the patient that there may be a patient responsible charge related to this service. The patient expressed understanding and agreed to proceed.   History of Present Illness: Natalie Wagner is going out a little more. Her depression and anxiety are ongoing and are related to her grandmother's illness. Her grandmother is currently in the hospital. Overall Natalie Wagner feels she is doing ok. Her sleep and appetite are good. She is making an effort to eat healthier. She is no longer napping during the day. Natalie Wagner denies AVH. She has ongoing paranoia. She denies SI/HI.    Observations/Objective:  General Appearance: unable to assess  Eye Contact:  unable to assess  Speech:  Clear and Coherent and Normal Rate  Volume:  Decreased  Mood:  Euthymic  Affect:  Blunt  Thought Process:  Coherent, Linear and Descriptions of Associations: Intact  Orientation:  Full (Time, Place, and Person)  Thought Content:  Paranoid Ideation  Suicidal Thoughts:  No  Homicidal Thoughts:  No  Memory:  Immediate;   Good  Judgement:  Good  Insight:  Good  Psychomotor Activity: unable to assess  Concentration:  Concentration: Good  Recall:  Good  Fund of Knowledge:  Good  Language:  Good  Akathisia:  unable to assess  Handed:  Right  AIMS (if indicated):     Assets:  Communication Skills Desire for Improvement Financial Resources/Insurance Housing Resilience Social Support Talents/Skills Transportation  ADL's:  unable to assess  Cognition:  WNL  Sleep:         Assessment and Plan:  MDD- recurrent, moderate; Schizophrenia- undifferenitated type; Insomnia; r/o eating disorder. H/o ODD  Zyprexa 20mg  po qHS Cogentin 1mg  po BID  Going to therapy  Reviewed labs done on 04/01/20 Prolactin level elevated- pt reports no pain, no change in size or swelling. When squeeze breast she will express a few drops milky colored discharged   Follow Up Instructions: In 3 months or sooner if needed   I discussed the assessment and treatment plan with the patient. The patient was provided an opportunity to ask questions and all were answered. The patient agreed with the plan and demonstrated an understanding of the instructions.   The patient was advised to call back or seek an in-person evaluation if the symptoms worsen or if the condition fails to improve as anticipated.  I provided 15 minutes of non-face-to-face time during this encounter.   [name], MD

## 2020-08-15 ENCOUNTER — Ambulatory Visit (INDEPENDENT_AMBULATORY_CARE_PROVIDER_SITE_OTHER): Payer: Medicaid Other | Admitting: Licensed Clinical Social Worker

## 2020-08-15 ENCOUNTER — Encounter (HOSPITAL_COMMUNITY): Payer: Self-pay | Admitting: Licensed Clinical Social Worker

## 2020-08-15 ENCOUNTER — Other Ambulatory Visit: Payer: Self-pay

## 2020-08-15 DIAGNOSIS — F201 Disorganized schizophrenia: Secondary | ICD-10-CM

## 2020-08-15 NOTE — Progress Notes (Signed)
Virtual Visit via Video Note  I connected with Natalie Wagner on 08/15/20 at 10:00 AM EDT by a video enabled telemedicine application and verified that I am speaking with the correct person using two identifiers.  Location: Patient: home Provider: office   I discussed the limitations of evaluation and management by telemedicine and the availability of in person appointments. The patient expressed understanding and agreed to proceed.   Type of Therapy: Individual Therapy   Treatment Goals addressed: "I want to work on my anxiety, depression, self-confidence, and my ability to socialize"   Interventions: CBT  Summary: Natalie Wagner is a 23 y.o. female who presents with Schizophrenia, disorganized type   Suicidal/Homicidal: No -without intent/plan   Therapist Response:   Natalie Wagner met with clinician for an individual session. Natalie Wagner discussed her psychiatric symptoms, her current life events and her homework. Natalie Wagner reports she has been doing pretty well. She identified improvement in her anxiety. Clinician explored interactions at home. Natalie Wagner reported she has been spending time with her grandmother, cooking, coloring, and trying to keep herself calm. Natalie Wagner reports she has been doing well overall. Clinician reviewed CBT coping skills and thought stopping.    Plan: Return again in 4 weeks   Diagnosis: Axis I: Schizophrenia, disorganized type    I discussed the assessment and treatment plan with the patient. The patient was provided an opportunity to ask questions and all were answered. The patient agreed with the plan and demonstrated an understanding of the instructions.   The patient was advised to call back or seek an in-person evaluation if the symptoms worsen or if the condition fails to improve as anticipated.  I provided 40 minutes of non-face-to-face time during this encounter.   [name], LCSW

## 2020-09-11 ENCOUNTER — Ambulatory Visit (INDEPENDENT_AMBULATORY_CARE_PROVIDER_SITE_OTHER): Payer: Medicaid Other | Admitting: Licensed Clinical Social Worker

## 2020-09-11 ENCOUNTER — Encounter (HOSPITAL_COMMUNITY): Payer: Self-pay | Admitting: Licensed Clinical Social Worker

## 2020-09-11 ENCOUNTER — Other Ambulatory Visit: Payer: Self-pay

## 2020-09-11 DIAGNOSIS — F201 Disorganized schizophrenia: Secondary | ICD-10-CM | POA: Diagnosis not present

## 2020-09-11 NOTE — Progress Notes (Signed)
Virtual Visit via Video Note  I connected with Natalie Wagner on 09/11/20 at  9:00 AM EDT by a video enabled telemedicine application and verified that I am speaking with the correct person using two identifiers.  Location: Patient: home Provider: office   I discussed the limitations of evaluation and management by telemedicine and the availability of in person appointments. The patient expressed understanding and agreed to proceed.   Type of Therapy: Individual Therapy   Treatment Goals addressed: "I want to work on my anxiety, depression, self-confidence, and my ability to socialize"   Interventions: CBT  Summary: Natalie Wagner is a 24 y.o. female who presents with Schizophrenia, disorganized type   Suicidal/Homicidal: No -without intent/plan   Therapist Response:   Natalie Wagner met with clinician for an individual session. Natalie Wagner discussed her psychiatric symptoms, her current life events and her homework. Natalie Wagner reports she has been feeling good. Clincian utilized CBT to process current experiences, mood, and plans for getting healthy. Clinician provided supportive feedback and positive motivational thoughts for eating healthy and exercising. Natalie Wagner reported interest in going back to school. Clinician encouraged Natalie Wagner to provided previous IEP information to the Academic Advising office in order to make sure her academic needs are going to be met. Clinician also encouraged Natalie Wagner to start slowly in school in order to maintain motivation and reduce opportunity for burnout.     Plan: Return again in 4 weeks   Diagnosis: Axis I: Schizophrenia, disorganized type    I discussed the assessment and treatment plan with the patient. The patient was provided an opportunity to ask questions and all were answered. The patient agreed with the plan and demonstrated an understanding of the instructions.   The patient was advised to call back or seek an in-person evaluation if the  symptoms worsen or if the condition fails to improve as anticipated.  I provided 45 minutes of non-face-to-face time during this encounter.   [name], LCSW

## 2020-10-16 ENCOUNTER — Other Ambulatory Visit: Payer: Self-pay

## 2020-10-16 ENCOUNTER — Ambulatory Visit (INDEPENDENT_AMBULATORY_CARE_PROVIDER_SITE_OTHER): Payer: Medicaid Other | Admitting: Licensed Clinical Social Worker

## 2020-10-16 ENCOUNTER — Encounter (HOSPITAL_COMMUNITY): Payer: Self-pay | Admitting: Licensed Clinical Social Worker

## 2020-10-16 DIAGNOSIS — F201 Disorganized schizophrenia: Secondary | ICD-10-CM | POA: Diagnosis not present

## 2020-10-16 NOTE — Progress Notes (Signed)
Virtual Visit via Video Note  I connected with Natalie Wagner on 10/16/20 at 11:00 AM EST by a video enabled telemedicine application and verified that I am speaking with the correct person using two identifiers.  Location: Patient: home Provider: home office   I discussed the limitations of evaluation and management by telemedicine and the availability of in person appointments. The patient expressed understanding and agreed to proceed.  Type of Therapy: Individual Therapy   Treatment Goals addressed: "I want to work on my anxiety, depression, self-confidence, and my ability to socialize"   Interventions: CBT  Summary: Natalie Wagner is a 24 y.o. female who presents with Schizophrenia, disorganized type   Suicidal/Homicidal: No -without intent/plan   Therapist Response:   Natalie Wagner met with clinician for an individual session. Natalie Wagner discussed her psychiatric symptoms, her current life events and her homework. Natalie Wagner reports she has been doing well. She identified that Thanksgiving was a great weekend, enjoying time with her sisters and brothers, mother and grandmother. Clinician processed the interactions using CBT. Clinician explored stress management and coping with having everyone home again and together. Clinician also reflected the sense of jealousy or disappointment in herself that she has not returned to school. Clinician discussed options for returning to school and encouraged Natalie Wagner to start doing some research about what classes are available for people with special educational needs. Clinician also did research in session about transportation to and from [facility].  Clinician reviewed coping skills and discussed the value of using art and coloring to ease stress.   Plan: Return again in 4 weeks   Diagnosis: Axis I: Schizophrenia, disorganized type    I discussed the assessment and treatment plan with the patient. The patient was provided an opportunity to ask questions  and all were answered. The patient agreed with the plan and demonstrated an understanding of the instructions.   The patient was advised to call back or seek an in-person evaluation if the symptoms worsen or if the condition fails to improve as anticipated.  I provided 45 minutes of non-face-to-face time during this encounter.   [name], LCSW

## 2020-10-20 ENCOUNTER — Other Ambulatory Visit: Payer: Self-pay | Admitting: Gastroenterology

## 2020-11-14 ENCOUNTER — Telehealth (HOSPITAL_COMMUNITY): Payer: Medicaid Other | Admitting: Psychiatry

## 2020-11-14 ENCOUNTER — Other Ambulatory Visit: Payer: Self-pay

## 2020-11-14 DIAGNOSIS — F201 Disorganized schizophrenia: Secondary | ICD-10-CM

## 2020-11-14 DIAGNOSIS — G4700 Insomnia, unspecified: Secondary | ICD-10-CM

## 2020-11-14 DIAGNOSIS — F331 Major depressive disorder, recurrent, moderate: Secondary | ICD-10-CM

## 2020-11-14 MED ORDER — BENZTROPINE MESYLATE 1 MG PO TABS: 1.0000 mg | ORAL_TABLET | Freq: Two times a day (BID) | ORAL | 0 refills | Status: DC

## 2020-11-14 MED ORDER — OLANZAPINE 20 MG PO TBDP: 20.0000 mg | ORAL_TABLET | Freq: Every day | ORAL | 0 refills | Status: DC

## 2020-11-14 NOTE — Progress Notes (Unsigned)
Virtual Visit via Telephone Note  I connected with Natalie Wagner on 11/14/20 at  1:30 PM EST by telephone and verified that I am speaking with the correct person using two identifiers.  Location: Patient: home Provider: office   I discussed the limitations, risks, security and privacy concerns of performing an evaluation and management service by telephone and the availability of in person appointments. I also discussed with the patient that there may be a patient responsible charge related to this service. The patient expressed understanding and agreed to proceed.   History of Present Illness: Natalie Wagner shares that she is doing well. She is still really about her grandmother's health. Her is having racing thoughts and difficulty concentrating.She is able to fall asleep but wakes up early and then will eat or watching tv or spend minutes with intense anxiety. She is can usually fall back asleep after 15 min. Natalie Wagner's depression is mild and mostly doesn't bother her. She denies SI/HI. Natalie Wagner is watching her sugar intake. She denies AVH. Her paranoia has significantly decreased because she does not go out much due to COVID.    Observations/Objective:  General Appearance: unable to assess  Eye Contact:  unable to assess  Speech:  {Speech:22685}  Volume:  {Volume (PAA):22686}  Mood:  {BHH MOOD:22306}  Affect:  {Affect (PAA):22687}  Thought Process:  {Thought Process (PAA):22688}  Orientation:  {BHH ORIENTATION (PAA):22689}  Thought Content:  {Thought Content:22690}  Suicidal Thoughts:  {ST/HT (PAA):22692}  Homicidal Thoughts:  {ST/HT (PAA):22692}  Memory:  {BHH MEMORY:22881}  Judgement:  {Judgement (PAA):22694}  Insight:  {Insight (PAA):22695}  Psychomotor Activity: unable to assess  Concentration:  {Concentration:21399}  Recall:  {BHH GOOD/FAIR/POOR:22877}  Fund of Knowledge:  {BHH GOOD/FAIR/POOR:22877}  Language:  {BHH GOOD/FAIR/POOR:22877}  Akathisia:  unable to assess   Handed:  {Handed:22697}  AIMS (if indicated):     Assets:  {Assets (PAA):22698}  ADL's:  unable to assess  Cognition:  {chl bhh cognition:304700322}  Sleep:         Assessment and Plan: 1. Schizophrenia, disorganized type (HCC) - benztropine (COGENTIN) 1 MG tablet; Take 1 tablet (1 mg total) by mouth 2 (two) times daily.  Dispense: 180 tablet; Refill: 0 - OLANZapine zydis (ZYPREXA) 20 MG disintegrating tablet; Take 1 tablet (20 mg total) by mouth at bedtime.  Dispense: 90 tablet; Refill: 0  2. MDD (major depressive disorder), recurrent episode, moderate (HCC) - OLANZapine zydis (ZYPREXA) 20 MG disintegrating tablet; Take 1 tablet (20 mg total) by mouth at bedtime.  Dispense: 90 tablet; Refill: 0  3. Insomnia, unspecified type - melatonin prn OTC  Encouraged to continue therapy as she is reporting benefit.   Follow Up Instructions: In 3 months or sooner if needed   I discussed the assessment and treatment plan with the patient. The patient was provided an opportunity to ask questions and all were answered. The patient agreed with the plan and demonstrated an understanding of the instructions.   The patient was advised to call back or seek an in-person evaluation if the symptoms worsen or if the condition fails to improve as anticipated.  I provided 15 minutes of non-face-to-face time during this encounter.   Oletta Darter, MD

## 2020-11-20 ENCOUNTER — Ambulatory Visit (INDEPENDENT_AMBULATORY_CARE_PROVIDER_SITE_OTHER): Payer: Federal, State, Local not specified - PPO | Admitting: Licensed Clinical Social Worker

## 2020-11-20 ENCOUNTER — Other Ambulatory Visit: Payer: Self-pay

## 2020-11-20 DIAGNOSIS — F201 Disorganized schizophrenia: Secondary | ICD-10-CM

## 2020-11-21 ENCOUNTER — Encounter (HOSPITAL_COMMUNITY): Payer: Self-pay | Admitting: Licensed Clinical Social Worker

## 2020-11-21 NOTE — Progress Notes (Signed)
Virtual Visit via Video Note  I connected with Natalie Wagner on 11/21/20 at 11:00 AM EST by a video enabled telemedicine application and verified that I am speaking with the correct person using two identifiers.  Location: Patient: home Provider: office   I discussed the limitations of evaluation and management by telemedicine and the availability of in person appointments. The patient expressed understanding and agreed to proceed.  Type of Therapy: Individual Therapy   Treatment Goals addressed: "I want to work on my anxiety, depression, self-confidence, and my ability to socialize"   Interventions: CBT  Summary: Natalie Wagner is a 25 y.o. female who presents with Schizophrenia, disorganized type   Suicidal/Homicidal: No -without intent/plan   Therapist Response:   Natalie Wagner met with clinician for an individual session. Natalie Wagner discussed her psychiatric symptoms, her current life events and her homework. Natalie Wagner reports she has been doing well. She identified that she enjoyed her holidays with her family. Clinician processed interactions with family over the holidays and noted concerns about family members who tested positive for COVID. Clinician explored Natalie Wagner's thoughts about getting vaccinated, noting that Natalie Wagner continues to be against getting the vaccine, but she works hard to ensure she wears a mask and keeps distance from others when out in the community. Clinician processed emotional state and noted that anxiety has been under control most of the time. Natalie Wagner reports there are some times that she feels uncertain or shaky, but she uses CBT coping skills, watching tv, coloring, drawing.     Plan: Return again in 4 weeks   Diagnosis: Axis I: Schizophrenia, disorganized type    I discussed the assessment and treatment plan with the patient. The patient was provided an opportunity to ask questions and all were answered. The patient agreed with the plan and demonstrated  an understanding of the instructions.   The patient was advised to call back or seek an in-person evaluation if the symptoms worsen or if the condition fails to improve as anticipated.  I provided 45 minutes of non-face-to-face time during this encounter.   [name], LCSW

## 2020-11-28 ENCOUNTER — Ambulatory Visit (INDEPENDENT_AMBULATORY_CARE_PROVIDER_SITE_OTHER): Payer: Federal, State, Local not specified - PPO | Admitting: Nurse Practitioner

## 2020-11-28 ENCOUNTER — Encounter: Payer: Self-pay | Admitting: Nurse Practitioner

## 2020-11-28 VITALS — BP 110/70 | HR 86 | Ht 59.0 in | Wt 186.0 lb

## 2020-11-28 DIAGNOSIS — E739 Lactose intolerance, unspecified: Secondary | ICD-10-CM | POA: Diagnosis not present

## 2020-11-28 DIAGNOSIS — K589 Irritable bowel syndrome without diarrhea: Secondary | ICD-10-CM | POA: Diagnosis not present

## 2020-11-28 MED ORDER — HYOSCYAMINE SULFATE 0.125 MG SL SUBL: 0.1250 mg | SUBLINGUAL_TABLET | Freq: Two times a day (BID) | SUBLINGUAL | 4 refills | Status: DC

## 2020-11-28 NOTE — Progress Notes (Signed)
ASSESSMENT AND PLAN    # 25 yo female with IBS / chronic constipation.  Miralax caused loose BMs. She is managing constipation with increased intake of fruit and water. She continues to have abdominal cramping, mainly upper relieved with SL Levsin. She would like to increase dose of SL Levsin to BID as needed. be able to take a second dose of Levsin.  --Will refill SL Levsin, she can increase the dose to BID as needed though it may cause worsening constipation.  Since she didn't tolerate Miralax we can discuss other options such as Amitiza should she develop recurrent constipation.  --Follow up in a year, sooner if having problems.    # Lactose intolerance --She doesn't totally avoid Lactose but makes an effort to limit consumption of it. She  inquires about Lactaid which is fine to try. However, she will need to see if the enzymes are available in liquid form or in a gummy since she doesn't tolerate pills very well.      HISTORY OF PRESENT ILLNESS     Primary Gastroenterologist : Natalie Bowie, MD  Chief Complaint :wants refill on Levsin.   Natalie Wagner is a 25 y.o. female with PMH / [place] significant for,  but not necessarily limited to: asthma, obesity, lactose intolerance, IBS, schizophrenia, depression, hyperlipidemia. COVID infection Jan 2021   Patient had an extensive GI workup with Pediatric [facility] at [facility] several years ago and was diagnosed with lactose intolerance and IBS. She established care with [place] in 2018. She was last seen here in October 2020 by Natalie Bogus, PA for evaluation of abdominal cramping and constipation. We started Miralax and also refilled Levsin.   Interval History:  November continues to get intermittent abdominal cramping, especially in the upper abdomen but SL Levsin helps and she would like a refill. She takes it once a day but would like to be able to take a second dose when needed. The cramping is not necessarily related to eating and it  doesn't necessarily get better with defecation. She doesn't avoid lactose all together but does make an effort to significantly reduce her intake of it. Other people in her household are also lactose intolerant so the lactose free foods get eaten pretty quickly at her house. Mother does buy cheeses containing lower amounts of lactose.   Natalie Wagner stopped Miralax because it caused watery stool. She is now managing chronic constipation by eating more more fruit and drinking more water. Her stools are generally soft and she averages, 2-3 BMs a week.  Her weight is stable at 186 pounds.    Past Medical History:  Diagnosis Date  . Asthma    prn inhaler  . Constipation 05/05/2013  . Difficulty swallowing pills   . Eating disorder 07/27/2016  . Elevated prolactin level 12/27/2014  . Galactorrhea 12/27/2014  . History of seizures as a child    mother states were triggered by migraines; no seizures in > 7 yr.  . 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 Migraines   . Schizophrenia ([place])   . Simple cyst of kidney 01/24/2016  . Sleep apnea     Current Medications, Allergies, Past Surgical History, Family History and Social History were reviewed in [employer] record.   Current Outpatient Medications  Medication Sig Dispense Refill  . albuterol (PROAIR HFA) 108 (90 Base) MCG/ACT inhaler Inhale 2 puffs into the  lungs every 4 (four) hours as needed for wheezing or shortness of breath. 18 g 0  . benztropine (COGENTIN) 1 MG tablet Take 1 tablet (1 mg total) by mouth 2 (two) times daily. 180 tablet 0  . cetirizine (ZYRTEC ALLERGY) 10 MG tablet Take 1 tablet (10 mg total) by mouth daily. 30 tablet 0  . docusate (COLACE) 50 MG/5ML liquid Take 5 mLs (50 mg total) by mouth daily as needed for mild constipation. 100 mL 3  . fluticasone (FLONASE) 50 MCG/ACT nasal spray Place 2 sprays into both nostrils 2 (two) times daily. 16 g  0  . hyoscyamine (LEVSIN SL) 0.125 MG SL tablet Place 1 tablet (0.125 mg total) under the tongue every 4 (four) hours as needed. 180 tablet 5  . levonorgestrel (MIRENA) 20 MCG/24HR IUD 1 Intra Uterine Device (1 each total) by Intrauterine route once. 1 each 0  . Melatonin 5 MG TABS Take 1 tablet by mouth daily as needed (sleep).     . OLANZapine zydis (ZYPREXA) 20 MG disintegrating tablet Take 1 tablet (20 mg total) by mouth at bedtime. 90 tablet 0  . Olopatadine HCl 0.7 % SOLN Place 1 mL into both eyes daily as needed (allergies).     . RESTASIS 0.05 % ophthalmic emulsion 1 DROP TWICE A DAY INTO BOTH EYES  3   No current facility-administered medications for this visit.    Review of Systems: No chest pain. No shortness of breath. No urinary complaints.   PHYSICAL EXAM :    Wt Readings from Last 3 Encounters:  11/28/20 186 lb (84.4 kg)  04/29/20 180 lb (81.6 kg)  11/28/19 195 lb (88.5 kg)    Ht 4\' 11"  (1.499 m)   Wt 186 lb (84.4 kg)   BMI 37.57 kg/m  Constitutional:  Pleasant female in no acute distress. Here with her Mother.  Psychiatric: Normal mood and affect. Behavior is normal. EENT: Pupils normal.  Conjunctivae are normal. No scleral icterus. Neck supple.  Cardiovascular: Normal rate, regular rhythm. No edema Pulmonary/chest: Effort normal and breath sounds normal. No wheezing, rales or rhonchi. Abdominal: Soft, nondistended, nontender. Bowel sounds active throughout. There are no masses palpable. No hepatomegaly. Neurological: Alert and oriented to person place and time. Skin: Skin is warm and dry. No rashes noted.  Natalie Savoy, NP  11/28/2020, 9:59 AM

## 2020-11-28 NOTE — Patient Instructions (Addendum)
If you are age 25 or older, your body mass index should be between 23-30. Your Body mass index is 37.57 kg/m. If this is out of the aforementioned range listed, please consider follow up with your Primary Care Provider.  If you are age 66 or younger, your body mass index should be between 19-25. Your Body mass index is 37.57 kg/m. If this is out of the aformentioned range listed, please consider follow up with your Primary Care Provider.   Use Lactaide for tablets as directed.  Refills of Hyoscyamine 1 tablet twice daily,  have been sent to your pharmacy.  Follow up as needed.  Thank you for entrusting me with your care and choosing [facility].  Tye Savoy, NP-C

## 2020-12-25 ENCOUNTER — Ambulatory Visit (INDEPENDENT_AMBULATORY_CARE_PROVIDER_SITE_OTHER): Payer: Federal, State, Local not specified - PPO | Admitting: Licensed Clinical Social Worker

## 2020-12-25 ENCOUNTER — Encounter (HOSPITAL_COMMUNITY): Payer: Self-pay | Admitting: Licensed Clinical Social Worker

## 2020-12-25 ENCOUNTER — Other Ambulatory Visit: Payer: Self-pay

## 2020-12-25 DIAGNOSIS — F201 Disorganized schizophrenia: Secondary | ICD-10-CM | POA: Diagnosis not present

## 2020-12-25 NOTE — Progress Notes (Signed)
Virtual Visit via Video Note  I connected with Natalie Wagner on 12/25/20 at 11:00 AM EST by a video enabled telemedicine application and verified that I am speaking with the correct person using two identifiers.  Location: Patient: home Provider: home office   I discussed the limitations of evaluation and management by telemedicine and the availability of in person appointments. The patient expressed understanding and agreed to proceed.   Type of Therapy: Individual Therapy   Treatment Goals addressed: "I want to work on my anxiety, depression, self-confidence, and my ability to socialize". Natalie Wagner will report reduced depression and anxiety 5 out of 7 days.    Interventions: CBT  Summary: Natalie Wagner is a 25 y.o. female who presents with Schizophrenia, disorganized type   Suicidal/Homicidal: No -without intent/plan   Therapist Response:   Natalie Wagner met with clinician for an individual session. Natalie Wagner discussed her psychiatric symptoms, her current life events and her homework. Natalie Wagner reports she has been doing well overall. She identified some worry and anxiety about her grandmother's health, as well as her own issues with IBS. Clinician processed thoughts, feelings, and behaviors associated with anxiety using CBT. Clinician utilized reality testing to identify the impact of stress and worry on her physical health. Clinician also discussed medications to ensure that they are being taken appropriately. Clinician discussed family relationships and noted positivity with brothers at home, doing college on line. Clinician explored options for Natalie Wagner going to a day program. Sky reports she has visited [facility], but felt concerned about the ages of the people as well as the level of their mental illness. Clinician reviewed coping skills and encouraged daily exercise outdoors if possible to get some sun.    Plan: Return again in 4 weeks   Diagnosis: Axis I: Schizophrenia,  disorganized type     I discussed the assessment and treatment plan with the patient. The patient was provided an opportunity to ask questions and all were answered. The patient agreed with the plan and demonstrated an understanding of the instructions.   The patient was advised to call back or seek an in-person evaluation if the symptoms worsen or if the condition fails to improve as anticipated.  I provided 45 minutes of non-face-to-face time during this encounter.    R , LCSW  

## 2021-01-06 NOTE — Progress Notes (Signed)
Reviewed and agree with documentation and assessment and plan. K. Veena Godson Pollan , MD   

## 2021-01-21 ENCOUNTER — Other Ambulatory Visit: Payer: Self-pay

## 2021-01-21 ENCOUNTER — Ambulatory Visit (INDEPENDENT_AMBULATORY_CARE_PROVIDER_SITE_OTHER): Payer: Federal, State, Local not specified - PPO | Admitting: Licensed Clinical Social Worker

## 2021-01-21 DIAGNOSIS — F201 Disorganized schizophrenia: Secondary | ICD-10-CM

## 2021-01-23 ENCOUNTER — Encounter (HOSPITAL_COMMUNITY): Payer: Self-pay | Admitting: Licensed Clinical Social Worker

## 2021-01-23 NOTE — Progress Notes (Signed)
Virtual Visit via Video Note  I connected with Natalie Wagner on 01/23/21 at 11:00 AM EDT by a video enabled telemedicine application and verified that I am speaking with the correct person using two identifiers.  Location: Patient: home Provider: home office   I discussed the limitations of evaluation and management by telemedicine and the availability of in person appointments. The patient expressed understanding and agreed to proceed.  Type of Therapy: Individual Therapy   Treatment Goals addressed: "I want to work on my anxiety, depression, self-confidence, and my ability to socialize". Natalie Wagner will report reduced depression and anxiety 5 out of 7 days.    Interventions: CBT  Summary: Natalie Wagner is a 25 y.o. female who presents with Schizophrenia, disorganized type   Suicidal/Homicidal: No -without intent/plan   Therapist Response:   Natalie Wagner met with clinician for an individual session. Natalie Wagner discussed her psychiatric symptoms, her current life events and her homework. Natalie Wagner reports she is feeling fine. Clinician utilized CBT to process interactions with family members. Clinician explored ways to involve her siblings and mother in positive healthy behaviors, such as taking walks, doing exercises at home, and cooking healthy meals together. Clinician discussed Natalie Wagner's recent problems with her IBS and explored options for food allergy testing.  Clinician explored levels of anxiety and depression. Natalie Wagner shared that she continues daily self care and relaxation, so her anxiety and depression have been under control.    Plan: Return again in 4 weeks   Diagnosis: Axis I: Schizophrenia, disorganized type     I discussed the assessment and treatment plan with the patient. The patient was provided an opportunity to ask questions and all were answered. The patient agreed with the plan and demonstrated an understanding of the instructions.   The patient was advised to  call back or seek an in-person evaluation if the symptoms worsen or if the condition fails to improve as anticipated.  I provided 45 minutes of non-face-to-face time during this encounter.   [name], LCSW

## 2021-02-13 ENCOUNTER — Telehealth (INDEPENDENT_AMBULATORY_CARE_PROVIDER_SITE_OTHER): Payer: Federal, State, Local not specified - PPO | Admitting: Psychiatry

## 2021-02-13 ENCOUNTER — Other Ambulatory Visit: Payer: Self-pay

## 2021-02-13 DIAGNOSIS — F331 Major depressive disorder, recurrent, moderate: Secondary | ICD-10-CM

## 2021-02-13 DIAGNOSIS — F201 Disorganized schizophrenia: Secondary | ICD-10-CM | POA: Diagnosis not present

## 2021-02-13 MED ORDER — OLANZAPINE 20 MG PO TBDP: 20.0000 mg | ORAL_TABLET | Freq: Every day | ORAL | 0 refills | Status: DC

## 2021-02-13 MED ORDER — BENZTROPINE MESYLATE 1 MG PO TABS: 1.0000 mg | ORAL_TABLET | Freq: Two times a day (BID) | ORAL | 0 refills | Status: DC

## 2021-02-13 NOTE — Progress Notes (Signed)
Virtual Visit via Telephone Note  I connected with Natalie Wagner on 02/13/21 at  1:00 PM EDT by telephone and verified that I am speaking with the correct person using two identifiers.  Location: Patient: home Provider: office   I discussed the limitations, risks, security and privacy concerns of performing an evaluation and management service by telephone and the availability of in person appointments. I also discussed with the patient that there may be a patient responsible charge related to this service. The patient expressed understanding and agreed to proceed.   History of Present Illness: Natalie Wagner states she is mostly stable. She is not having any significant anxiety or depression symptoms. She is eating well. She likes to go out with her family. Her sleep is ok but notes her energy is on the low side. She takes her meds around 2pm and notes she can fall asleep quickly anytime after that. She states the paranoia and hallucinations are moderate and manageable. She denies SI/HI.    Observations/Objective:  General Appearance: unable to assess  Eye Contact:  unable to assess  Speech:  Clear and Coherent and Normal Rate  Volume:  Normal  Mood:  Euthymic  Affect:  Full Range  Thought Process:  Goal Directed, Linear and Descriptions of Associations: Intact  Orientation:  Full (Time, Place, and Person)  Thought Content:  Paranoid Ideation  Suicidal Thoughts:  No  Homicidal Thoughts:  No  Memory:  Immediate;   Good  Judgement:  Good  Insight:  Good  Psychomotor Activity: unable to assess  Concentration:  Concentration: Good  Recall:  Good  Fund of Knowledge:  Good  Language:  Good  Akathisia:  unable to assess  Handed:  Right  AIMS (if indicated):     Assets:  Communication Skills Desire for Improvement Financial Resources/Insurance Housing Social Support Talents/Skills Transportation Vocational/Educational  ADL's:  unable to assess  Cognition:  WNL  Sleep:          Assessment and Plan:  1. Schizophrenia, disorganized type (HCC) - benztropine (COGENTIN) 1 MG tablet; Take 1 tablet (1 mg total) by mouth 2 (two) times daily.  Dispense: 180 tablet; Refill: 0 - OLANZapine zydis (ZYPREXA) 20 MG disintegrating tablet; Take 1 tablet (20 mg total) by mouth at bedtime.  Dispense: 90 tablet; Refill: 0  2. MDD (major depressive disorder), recurrent episode, moderate (HCC) - OLANZapine zydis (ZYPREXA) 20 MG disintegrating tablet; Take 1 tablet (20 mg total) by mouth at bedtime.  Dispense: 90 tablet; Refill: 0   Follow Up Instructions: In 3 months or sooner if needed   I discussed the assessment and treatment plan with the patient. The patient was provided an opportunity to ask questions and all were answered. The patient agreed with the plan and demonstrated an understanding of the instructions.   The patient was advised to call back or seek an in-person evaluation if the symptoms worsen or if the condition fails to improve as anticipated.  I provided 11 minutes of non-face-to-face time during this encounter.   [name], MD

## 2021-02-18 ENCOUNTER — Ambulatory Visit: Payer: Federal, State, Local not specified - PPO | Admitting: Physician Assistant

## 2021-02-20 ENCOUNTER — Ambulatory Visit: Payer: Federal, State, Local not specified - PPO | Admitting: Nurse Practitioner

## 2021-03-11 ENCOUNTER — Ambulatory Visit (INDEPENDENT_AMBULATORY_CARE_PROVIDER_SITE_OTHER): Payer: Federal, State, Local not specified - PPO | Admitting: Licensed Clinical Social Worker

## 2021-03-11 ENCOUNTER — Other Ambulatory Visit: Payer: Self-pay

## 2021-03-11 DIAGNOSIS — F201 Disorganized schizophrenia: Secondary | ICD-10-CM

## 2021-03-13 ENCOUNTER — Ambulatory Visit: Payer: Federal, State, Local not specified - PPO | Admitting: Nurse Practitioner

## 2021-03-20 ENCOUNTER — Encounter (HOSPITAL_COMMUNITY): Payer: Self-pay | Admitting: Licensed Clinical Social Worker

## 2021-03-20 NOTE — Progress Notes (Signed)
Virtual Visit via Video Note  I connected with [name] on 03/20/21 at 11:00 AM EDT by a video enabled telemedicine application and verified that I am speaking with the correct person using two identifiers.  Location: Patient: home Provider: office   I discussed the limitations of evaluation and management by telemedicine and the availability of in person appointments. The patient expressed understanding and agreed to proceed.  Type of Therapy: Individual Therapy   Treatment Goals addressed: "I want to work on my anxiety, depression, self-confidence, and my ability to socialize". Ka will report reduced depression and anxiety 5 out of 7 days.    Interventions: CBT  Summary: Jaydeen Darley is a 25 y.o. female who presents with Schizophrenia, disorganized type   Suicidal/Homicidal: No -without intent/plan   Therapist Response:   Nazly met with clinician for an individual session. Zyanya discussed her psychiatric symptoms, her current life events and her homework. Addalee reports it has been busy with her siblings being in and out of the house, visiting, and planning for Mother's Day. Clinician explored Kaydance's mood, interactions, and thoughts using CBT. Clinician reflected ongoing positive mood and good attitude about life. However, clinician identified ongoing concerns about loneliness and boredom. Clinician discussed Zinia's plans to enroll in classes at [employer]. Clinician also reviewed application and academic/personal enrichment classes available, such as cookie decorating. Clinician utilized CBT to explore challenges that interfere with Luz's follow through on these tasks. Clinician assisted with developing a plan and enlisting supports to carry out her plan.   Plan: Return again in 4 weeks   Diagnosis: Axis I: Schizophrenia, disorganized type   I discussed the assessment and treatment plan with the patient. The patient was provided an opportunity to ask  questions and all were answered. The patient agreed with the plan and demonstrated an understanding of the instructions.   The patient was advised to call back or seek an in-person evaluation if the symptoms worsen or if the condition fails to improve as anticipated.  I provided 45 minutes of non-face-to-face time during this encounter.   [name], LCSW

## 2021-04-10 ENCOUNTER — Ambulatory Visit: Payer: Federal, State, Local not specified - PPO | Admitting: Nurse Practitioner

## 2021-04-22 ENCOUNTER — Ambulatory Visit (HOSPITAL_COMMUNITY): Payer: Federal, State, Local not specified - PPO | Admitting: Licensed Clinical Social Worker

## 2021-05-01 ENCOUNTER — Ambulatory Visit (INDEPENDENT_AMBULATORY_CARE_PROVIDER_SITE_OTHER): Payer: Federal, State, Local not specified - PPO | Admitting: Licensed Clinical Social Worker

## 2021-05-01 ENCOUNTER — Encounter (HOSPITAL_COMMUNITY): Payer: Self-pay | Admitting: Licensed Clinical Social Worker

## 2021-05-01 ENCOUNTER — Other Ambulatory Visit: Payer: Self-pay

## 2021-05-01 DIAGNOSIS — F201 Disorganized schizophrenia: Secondary | ICD-10-CM

## 2021-05-01 NOTE — Progress Notes (Signed)
Virtual Visit via Video Note  I connected with Natalie Wagner on 05/01/21 at 11:00 AM EDT by a video enabled telemedicine application and verified that I am speaking with the correct person using two identifiers.  Location: Patient: home Provider: home office   I discussed the limitations of evaluation and management by telemedicine and the availability of in person appointments. The patient expressed understanding and agreed to proceed.  Type of Therapy: Individual Therapy   Treatment Goals addressed: "I want to work on my anxiety, depression, self-confidence, and my ability to socialize". Natalie Wagner will report reduced depression and anxiety 5 out of 7 days.    Interventions: CBT Summary: Natalie Wagner is a 25 y.o. female who presents with Schizophrenia, disorganized type   Suicidal/Homicidal: No -without intent/plan   Therapist Response:   Natalie Wagner met with clinician for an individual session. Natalie Wagner discussed her psychiatric symptoms, her current life events and her homework. Natalie Wagner reports she has been doing well overall, but experiencing some anxiety lately. Clinician explored updates and triggers to current anxiety. Natalie Wagner shared updates about health concerns in the family and noted that Natalie Wagner feels concern about her mother, who works 2 jobs and also handles all the family stressors. Clinician introduced EFT Tapping to [place] and shared a video. Clinician and Natalie Wagner rehearsed tapping and clinician encouraged Natalie Wagner to start this as a daily practice, in order to reduce the impact of daily anxiety.    Plan: Return again in 4 weeks   Diagnosis: Axis I: Schizophrenia, disorganized type   I discussed the assessment and treatment plan with the patient. The patient was provided an opportunity to ask questions and all were answered. The patient agreed with the plan and demonstrated an understanding of the instructions.   The patient was advised to call back or seek an  in-person evaluation if the symptoms worsen or if the condition fails to improve as anticipated.  I provided 45 minutes of non-face-to-face time during this encounter.   [name], LCSW

## 2021-05-08 ENCOUNTER — Telehealth (HOSPITAL_COMMUNITY): Payer: Federal, State, Local not specified - PPO | Admitting: Psychiatry

## 2021-05-08 ENCOUNTER — Telehealth (INDEPENDENT_AMBULATORY_CARE_PROVIDER_SITE_OTHER): Payer: Federal, State, Local not specified - PPO | Admitting: Psychiatry

## 2021-05-08 ENCOUNTER — Other Ambulatory Visit: Payer: Self-pay

## 2021-05-08 DIAGNOSIS — Z79899 Other long term (current) drug therapy: Secondary | ICD-10-CM

## 2021-05-08 DIAGNOSIS — F201 Disorganized schizophrenia: Secondary | ICD-10-CM

## 2021-05-08 DIAGNOSIS — F331 Major depressive disorder, recurrent, moderate: Secondary | ICD-10-CM

## 2021-05-08 DIAGNOSIS — G4700 Insomnia, unspecified: Secondary | ICD-10-CM

## 2021-05-08 MED ORDER — OLANZAPINE 20 MG PO TBDP: 20.0000 mg | ORAL_TABLET | Freq: Every day | ORAL | 0 refills | Status: DC

## 2021-05-08 MED ORDER — BENZTROPINE MESYLATE 1 MG PO TABS: 1.0000 mg | ORAL_TABLET | Freq: Two times a day (BID) | ORAL | 0 refills | Status: DC

## 2021-05-08 NOTE — Progress Notes (Signed)
Virtual Visit via Telephone Note  I connected with Natalie Wagner on 05/08/21 at  3:30 PM EDT by telephone and verified that I am speaking with the correct person using two identifiers.  Location: Patient: home Provider: office   I discussed the limitations, risks, security and privacy concerns of performing an evaluation and management service by telephone and the availability of in person appointments. I also discussed with the patient that there may be a patient responsible charge related to this service. The patient expressed understanding and agreed to proceed.   History of Present Illness: Cing states her anxiety has been a little worse lately. She is working on Proofreader in therapy. It is helping. Depression is still there and over the last 14 days she has had 2 days of depression. She denies anhedonia. She is engaging in activities that she enjoys. She denies isolation. Sleep and appetite are good. She is working on diet changes and is drinking more water. Ariell has no motivation to work out. She denies irritability and angry outbursts. She denies SI/HI. Vivianne denies any recent AVH and ideas of reference. She does have near daily paranoia mostly related to getting COVID.    Observations/Objective:  General Appearance: unable to assess  Eye Contact:  unable to assess  Speech:  Clear and Coherent and Normal Rate  Volume:  Normal  Mood:  Euthymic  Affect:  Full Range  Thought Process:  Coherent, Linear, and Descriptions of Associations: Intact  Orientation:  Full (Time, Place, and Person)  Thought Content:  Paranoid Ideation  Suicidal Thoughts:  No  Homicidal Thoughts:  No  Memory:  Immediate;   Good  Judgement:  Good  Insight:  Good  Psychomotor Activity: unable to assess  Concentration:  Concentration: Good  Recall:  Good  Fund of Knowledge:  Good  Language:  Good  Akathisia:  unable to assess  Handed:  Right  AIMS (if indicated):     Assets:   Communication Skills Desire for Improvement Financial Resources/Insurance Housing Resilience Social Support Talents/Skills Transportation Vocational/Educational  ADL's:  unable to assess  Cognition:  WNL  Sleep:        Assessment and Plan: Depression screen [facility] 2/9 05/08/2021 04/29/2020 08/17/2019 05/17/2019 11/23/2018  Decreased Interest 0 1 0 0 0  Down, Depressed, Hopeless 1 1 0 0 1  PHQ - 2 Score 1 2 0 0 1  Altered sleeping - 1 - - -  Tired, decreased energy - 1 - - -  Change in appetite - 3 - - -  Feeling bad or failure about yourself  - 0 - - -  Trouble concentrating - 2 - - -  Moving slowly or fidgety/restless - 1 - - -  Suicidal thoughts - 0 - - -  PHQ-9 Score - 10 - - -  Difficult doing work/chores - Not difficult at all - - -  Some recent data might be hidden   Flowsheet Row Video Visit from 05/08/2021 in [place] No Risk       1. Encounter for long-term current use of high risk medication - CBC - TSH - Lipid panel - Comprehensive metabolic panel - Hemoglobin A1c - Prolactin  2. Schizophrenia, disorganized type ([place]) - benztropine (COGENTIN) 1 MG tablet; Take 1 tablet (1 mg total) by mouth 2 (two) times daily.  Dispense: 180 tablet; Refill: 0 - OLANZapine zydis (ZYPREXA) 20 MG disintegrating tablet; Take 1 tablet (20 mg total) by  mouth at bedtime.  Dispense: 90 tablet; Refill: 0  3. MDD (major depressive disorder), recurrent episode, moderate (HCC) - OLANZapine zydis (ZYPREXA) 20 MG disintegrating tablet; Take 1 tablet (20 mg total) by mouth at bedtime.  Dispense: 90 tablet; Refill: 0  4. Insomnia, unspecified type   Follow Up Instructions: In 2-3 months or sooner if needed   I discussed the assessment and treatment plan with the patient. The patient was provided an opportunity to ask questions and all were answered. The patient agreed with the plan and demonstrated an understanding of the  instructions.   The patient was advised to call back or seek an in-person evaluation if the symptoms worsen or if the condition fails to improve as anticipated.  I provided 12 minutes of non-face-to-face time during this encounter.   [name], MD

## 2021-05-13 DIAGNOSIS — F201 Disorganized schizophrenia: Secondary | ICD-10-CM | POA: Diagnosis not present

## 2021-05-13 DIAGNOSIS — Z79899 Other long term (current) drug therapy: Secondary | ICD-10-CM | POA: Diagnosis not present

## 2021-05-14 LAB — CBC
Hematocrit: 35.7 % (ref 34.0–46.6)
Hemoglobin: 11.6 g/dL (ref 11.1–15.9)
MCH: 29.6 pg (ref 26.6–33.0)
MCHC: 32.5 g/dL (ref 31.5–35.7)
MCV: 91 fL (ref 79–97)
Platelets: 360 10*3/uL (ref 150–450)
RBC: 3.92 x10E6/uL (ref 3.77–5.28)
RDW: 11.5 % — ABNORMAL LOW (ref 11.7–15.4)
WBC: 7 10*3/uL (ref 3.4–10.8)

## 2021-05-14 LAB — COMPREHENSIVE METABOLIC PANEL
ALT: 23 IU/L (ref 0–32)
AST: 25 IU/L (ref 0–40)
Albumin/Globulin Ratio: 1.8 (ref 1.2–2.2)
Albumin: 4.8 g/dL (ref 3.9–5.0)
Alkaline Phosphatase: 93 IU/L (ref 44–121)
BUN/Creatinine Ratio: 15 (ref 9–23)
BUN: 14 mg/dL (ref 6–20)
Bilirubin Total: 0.4 mg/dL (ref 0.0–1.2)
CO2: 24 mmol/L (ref 20–29)
Calcium: 9.4 mg/dL (ref 8.7–10.2)
Chloride: 100 mmol/L (ref 96–106)
Creatinine, Ser: 0.95 mg/dL (ref 0.57–1.00)
Globulin, Total: 2.6 g/dL (ref 1.5–4.5)
Glucose: 97 mg/dL (ref 65–99)
Potassium: 4.6 mmol/L (ref 3.5–5.2)
Sodium: 139 mmol/L (ref 134–144)
Total Protein: 7.4 g/dL (ref 6.0–8.5)
eGFR: 86 mL/min/{1.73_m2} (ref >59)

## 2021-05-14 LAB — TSH: TSH: 2.13 u[IU]/mL (ref 0.450–4.500)

## 2021-05-14 LAB — HEMOGLOBIN A1C
Est. average glucose Bld gHb Est-mCnc: 114 mg/dL
Hgb A1c MFr Bld: 5.6 % (ref 4.8–5.6)

## 2021-05-14 LAB — LIPID PANEL
Chol/HDL Ratio: 6 ratio — ABNORMAL HIGH (ref 0.0–4.4)
Cholesterol, Total: 211 mg/dL — ABNORMAL HIGH (ref 100–199)
HDL: 35 mg/dL — ABNORMAL LOW (ref >39)
LDL Chol Calc (NIH): 166 mg/dL — ABNORMAL HIGH (ref 0–99)
Triglycerides: 56 mg/dL (ref 0–149)
VLDL Cholesterol Cal: 10 mg/dL (ref 5–40)

## 2021-05-14 LAB — PROLACTIN: Prolactin: 39.6 ng/mL — ABNORMAL HIGH (ref 4.8–23.3)

## 2021-05-16 ENCOUNTER — Telehealth (HOSPITAL_COMMUNITY): Payer: Self-pay | Admitting: *Deleted

## 2021-05-16 NOTE — Telephone Encounter (Signed)
FYI results received from [place] for draw date of 05/13/21. Prolactin elevated at 39.6 and Lipid Panel abnormal as well.HgbA1C 5.6.

## 2021-05-29 ENCOUNTER — Ambulatory Visit (HOSPITAL_COMMUNITY): Payer: Federal, State, Local not specified - PPO | Admitting: Licensed Clinical Social Worker

## 2021-06-26 ENCOUNTER — Ambulatory Visit (INDEPENDENT_AMBULATORY_CARE_PROVIDER_SITE_OTHER): Payer: Federal, State, Local not specified - PPO | Admitting: Licensed Clinical Social Worker

## 2021-06-26 ENCOUNTER — Encounter (HOSPITAL_COMMUNITY): Payer: Self-pay | Admitting: Licensed Clinical Social Worker

## 2021-06-26 ENCOUNTER — Other Ambulatory Visit: Payer: Self-pay

## 2021-06-26 DIAGNOSIS — F201 Disorganized schizophrenia: Secondary | ICD-10-CM | POA: Diagnosis not present

## 2021-06-26 NOTE — Progress Notes (Signed)
Virtual Visit via Video Note  I connected with Natalie Wagner on 06/26/21 at 10:00 AM EDT by a video enabled telemedicine application and verified that I am speaking with the correct person using two identifiers.  Location: Patient: home Provider: home office   I discussed the limitations of evaluation and management by telemedicine and the availability of in person appointments. The patient expressed understanding and agreed to proceed.  Type of Therapy: Individual Therapy   Treatment Goals addressed: "I want to work on my anxiety, depression, self-confidence, and my ability to socialize". Natalie Wagner will report reduced depression and anxiety 5 out of 7 days.    Interventions: CBT Summary: Natalie Wagner is a 25 y.o. female who presents with Schizophrenia, disorganized type   Suicidal/Homicidal: No -without intent/plan   Therapist Response:   Natalie Wagner met with clinician for an individual session. Natalie Wagner discussed her psychiatric symptoms, her current life events and her homework. Natalie Wagner reports she is doing pretty well. She reported that she has been practicing the tapping taught in last session and she likes it. However, she shared ongoing problems with anxiety, specifically fear of dogs. Clinician discussed the fear and processed ways to gradually desensitize herself to her brother's dog. Clinician provided CBT psychoeducation about gradual desensitization and identified the value of deep breathing, relaxation, and positive self talk while doing so.   Plan: Return again in 4 weeks   Diagnosis: Axis I: Schizophrenia, disorganized type     I discussed the assessment and treatment plan with the patient. The patient was provided an opportunity to ask questions and all were answered. The patient agreed with the plan and demonstrated an understanding of the instructions.   The patient was advised to call back or seek an in-person evaluation if the symptoms worsen or if the condition  fails to improve as anticipated.  I provided 45 minutes of non-face-to-face time during this encounter.   [name], LCSW

## 2021-07-24 ENCOUNTER — Ambulatory Visit (HOSPITAL_COMMUNITY): Payer: Federal, State, Local not specified - PPO | Admitting: Licensed Clinical Social Worker

## 2021-07-24 ENCOUNTER — Other Ambulatory Visit: Payer: Self-pay

## 2021-07-31 ENCOUNTER — Telehealth (INDEPENDENT_AMBULATORY_CARE_PROVIDER_SITE_OTHER): Payer: Federal, State, Local not specified - PPO | Admitting: Psychiatry

## 2021-07-31 ENCOUNTER — Other Ambulatory Visit: Payer: Self-pay

## 2021-07-31 DIAGNOSIS — F4322 Adjustment disorder with anxiety: Secondary | ICD-10-CM | POA: Diagnosis not present

## 2021-07-31 DIAGNOSIS — F201 Disorganized schizophrenia: Secondary | ICD-10-CM

## 2021-07-31 DIAGNOSIS — F331 Major depressive disorder, recurrent, moderate: Secondary | ICD-10-CM

## 2021-07-31 MED ORDER — OLANZAPINE 20 MG PO TBDP: 20.0000 mg | ORAL_TABLET | Freq: Every day | ORAL | 0 refills | Status: DC

## 2021-07-31 MED ORDER — BENZTROPINE MESYLATE 1 MG PO TABS: 1.0000 mg | ORAL_TABLET | Freq: Two times a day (BID) | ORAL | 0 refills | Status: DC

## 2021-07-31 NOTE — Progress Notes (Signed)
Virtual Visit via Video Note  I connected with Natalie Wagner on 07/31/21 at  1:30 PM EDT by a video enabled telemedicine application and verified that I am speaking with the correct person using two identifiers.  Location: Patient: home Provider: office   I discussed the limitations of evaluation and management by telemedicine and the availability of in person appointments. The patient expressed understanding and agreed to proceed.  History of Present Illness: "Pretty well. Pretty well". She is a little stressed. She needs to get out the house more. Natalie Wagner is supposed to cook and do more chores. Her anxiety is high but she is using coping skills she learned in therapy. It helps sometimes. Natalie Wagner is making better food choices and wants to better with her dental health. Her depression comes and goes. She gets sad and randomly will start thinking about things that bother her but don't matter. Natalie Wagner is depressed on a daily basis.  Her average level of depression is 4/10 (10 being the worst). She tries to be around others because it helps to bring her mood. Her motivation varies day to day. Natalie Wagner is going to start taking some courses in the spring. She has been working on reading and comprehension. It is getting a little better but she still struggles. Natalie Wagner's sleep, energy and appetite are good. She denies SI/HI. She denies paranoia, ideas of reference and hallucinations. Natalie Wagner is taking her meds and finds they are helpful. Her anxiety and depression are her main struggles as she hasn't found the best way to deal with it yet.    Observations/Objective: Psychiatric Specialty Exam: ROS  There were no vitals taken for this visit.There is no height or weight on file to calculate BMI.  General Appearance: Casual and Fairly Groomed  Eye Contact:  Good  Speech:  Clear and Coherent and Normal Rate  Volume:  Normal  Mood:  Anxious and Depressed  Affect:  Restricted  Thought Process:   Coherent, Linear, and Descriptions of Associations: Intact  Orientation:  Full (Time, Place, and Person)  Thought Content:  Logical  Suicidal Thoughts:  No  Homicidal Thoughts:  No  Memory:  Immediate;   Good  Judgement:  Good  Insight:  Good  Psychomotor Activity:  Normal  Concentration:  Concentration: Good  Recall:  Good  Fund of Knowledge:  Good  Language:  Good  Akathisia:  No  Handed:  Right  AIMS (if indicated):     Assets:  Communication Skills Desire for Improvement Financial Resources/Insurance Housing Resilience Social Support Talents/Skills Transportation Vocational/Educational  ADL's:  Intact  Cognition:  WNL  Sleep:        Assessment and Plan: Depression screen [facility] 2/9 07/31/2021 05/08/2021 04/29/2020 08/17/2019 05/17/2019  Decreased Interest 1 0 1 0 0  Down, Depressed, Hopeless 3 1 1  0 0  PHQ - 2 Score 4 1 2  0 0  Altered sleeping 0 - 1 - -  Tired, decreased energy 0 - 1 - -  Change in appetite 0 - 3 - -  Feeling bad or failure about yourself  0 - 0 - -  Trouble concentrating 1 - 2 - -  Moving slowly or fidgety/restless 0 - 1 - -  Suicidal thoughts 0 - 0 - -  PHQ-9 Score 5 - 10 - -  Difficult doing work/chores Somewhat difficult - Not difficult at all - -  Some recent data might be hidden    Flowsheet Row Video Visit from 07/31/2021 in [place]  [facility] Video Visit from 05/08/2021 in [place] No Risk No Risk      - she is scared to try any other meds and wants to deal with her depression and anxiety in therapy  - reviewed labs 05/13/2021 - prolactin level 39.6 (she is denying any further growth in breast and only has milky discharge when she squeezes her breasts. Overall the quantity is less than before). She does not have a gynecologist but does have a IUD and has light periods. Her cholesterol and LDL are elevated. She is working with her PCP regarding her  health issues. They are trying to manage with diet and exercise for now.   1. Schizophrenia, disorganized type (HCC) - OLANZapine zydis (ZYPREXA) 20 MG disintegrating tablet; Take 1 tablet (20 mg total) by mouth at bedtime.  Dispense: 90 tablet; Refill: 0 - benztropine (COGENTIN) 1 MG tablet; Take 1 tablet (1 mg total) by mouth 2 (two) times daily.  Dispense: 180 tablet; Refill: 0  2. MDD (major depressive disorder), recurrent episode, moderate (HCC) - OLANZapine zydis (ZYPREXA) 20 MG disintegrating tablet; Take 1 tablet (20 mg total) by mouth at bedtime.  Dispense: 90 tablet; Refill: 0  3. Adjustment disorder with anxious mood   Follow Up Instructions: In 2-3 months or sooner if needed   I discussed the assessment and treatment plan with the patient. The patient was provided an opportunity to ask questions and all were answered. The patient agreed with the plan and demonstrated an understanding of the instructions.   The patient was advised to call back or seek an in-person evaluation if the symptoms worsen or if the condition fails to improve as anticipated.  I provided 19 minutes of non-face-to-face time during this encounter.   [name], MD

## 2021-08-28 ENCOUNTER — Ambulatory Visit (HOSPITAL_COMMUNITY): Payer: Federal, State, Local not specified - PPO | Admitting: Licensed Clinical Social Worker

## 2021-08-28 ENCOUNTER — Other Ambulatory Visit: Payer: Self-pay

## 2021-09-18 ENCOUNTER — Encounter (HOSPITAL_COMMUNITY): Payer: Self-pay | Admitting: Licensed Clinical Social Worker

## 2021-09-18 ENCOUNTER — Other Ambulatory Visit: Payer: Self-pay

## 2021-09-18 ENCOUNTER — Ambulatory Visit (INDEPENDENT_AMBULATORY_CARE_PROVIDER_SITE_OTHER): Payer: Federal, State, Local not specified - PPO | Admitting: Licensed Clinical Social Worker

## 2021-09-18 DIAGNOSIS — F201 Disorganized schizophrenia: Secondary | ICD-10-CM | POA: Diagnosis not present

## 2021-09-18 NOTE — Progress Notes (Signed)
Virtual Visit via Video Note  I connected with Natalie Wagner on 09/18/21 at 11:00 AM EST by a video enabled telemedicine application and verified that I am speaking with the correct person using two identifiers.  Location: Patient: home Provider: home office   I discussed the limitations of evaluation and management by telemedicine and the availability of in person appointments. The patient expressed understanding and agreed to proceed.  Type of Therapy: Individual Therapy   Treatment Goals addressed: "I want to work on my anxiety, depression, self-confidence, and my ability to socialize". Natalie Wagner will report reduced depression and anxiety 5 out of 7 days.    Interventions: CBT, case management Summary: Natalie Wagner is a 25 y.o. female who presents with Schizophrenia, disorganized type   Suicidal/Homicidal: No -without intent/plan   Therapist Response:   Natalie Wagner met with Natalie Wagner for an individual session. Natalie Wagner discussed her psychiatric symptoms, her current life events and her homework. Natalie Wagner reports things are going pretty well. She identified some concerns about school and making sure she gets her accommodations set up for her entrance exam and then in her classes. Natalie Wagner engaged in case management in order to find the office of disability services at [facility] and provide that information to Natalie Wagner during session. Natalie Wagner also discussed Natalie Wagner's concerns about transportation. Natalie Wagner also provided Natalie Wagner with application to [employer] [place] in order to get transportation to and from [facility]. Natalie Wagner encouraged Natalie Wagner to reach out to her high school to see if they still have her records with old IEP. Natalie Wagner also discussed reaching out to mom and sister for assistance in completing these forms. Natalie Wagner utilized CBT to explore depression and anxiety sxs. Natalie Wagner noted that for the most part, she feels pretty well. Natalie Wagner reviewed EFT Tapping with Natalie Wagner and  encouraged her to use tapping on a daily basis to manage anxiety sxs and overall sense of nervousness that is present all day.    Plan: Return again in 4 weeks   Diagnosis: Axis I: Schizophrenia, disorganized type      I discussed the assessment and treatment plan with the patient. The patient was provided an opportunity to ask questions and all were answered. The patient agreed with the plan and demonstrated an understanding of the instructions.   The patient was advised to call back or seek an in-person evaluation if the symptoms worsen or if the condition fails to improve as anticipated.  I provided 45 minutes of non-face-to-face time during this encounter.   [name], LCSW

## 2021-09-25 ENCOUNTER — Other Ambulatory Visit: Payer: Self-pay

## 2021-09-25 ENCOUNTER — Telehealth (HOSPITAL_BASED_OUTPATIENT_CLINIC_OR_DEPARTMENT_OTHER): Payer: Federal, State, Local not specified - PPO | Admitting: Psychiatry

## 2021-09-25 DIAGNOSIS — F331 Major depressive disorder, recurrent, moderate: Secondary | ICD-10-CM

## 2021-09-25 DIAGNOSIS — F201 Disorganized schizophrenia: Secondary | ICD-10-CM | POA: Diagnosis not present

## 2021-09-25 MED ORDER — BENZTROPINE MESYLATE 1 MG PO TABS: 1.0000 mg | ORAL_TABLET | Freq: Two times a day (BID) | ORAL | 0 refills | Status: DC

## 2021-09-25 MED ORDER — OLANZAPINE 20 MG PO TBDP: 20.0000 mg | ORAL_TABLET | Freq: Every day | ORAL | 0 refills | Status: DC

## 2021-09-25 NOTE — Progress Notes (Signed)
Virtual Visit via Video Note  I connected with Natalie Wagner on 09/25/21 at  9:30 AM EST by a video enabled telemedicine application and verified that I am speaking with the correct person using two identifiers.  Location: Patient: home Provider: office   I discussed the limitations of evaluation and management by telemedicine and the availability of in person appointments. The patient expressed understanding and agreed to proceed.  History of Present Illness: Natalie Wagner states her anxiety and depression are the same. She is taking her meds and using EFT tapping and both are helping. The EFT does calm her anxiety. She is doing therapy sessions once/month. On days she is depressed she notes sadness and decreased motivation and will spend more time watching tv. This happens 2-3 days/month. She denies anhedonia, isolation and hopelessness on all other days. Her sleep has improved and she has noticed she is not as fatigued. Her appetite is good. Natalie Wagner tries to push thru it. She denies SI/HI. Once a week she hears a female or female voice that says "don't do that". It is usually only brief and doesn't say anything. It usually occurs when she is doing something and it feels like it trying to make her stop doing something. Natalie Wagner then feels scared but is able to push thru it. She denies paranoia, ideas of reference and VH.    Observations/Objective: Psychiatric Specialty Exam: ROS  There were no vitals taken for this visit.There is no height or weight on file to calculate BMI.  General Appearance: Casual and Neat  Eye Contact:  Good  Speech:  Clear and Coherent and Normal Rate  Volume:  Normal  Mood:  Euthymic  Affect:  Blunt  Thought Process:  Coherent and Descriptions of Associations: Circumstantial, concrete  Orientation:  Full (Time, Place, and Person)  Thought Content:  Logical  Suicidal Thoughts:  No  Homicidal Thoughts:  No  Memory:  Immediate;   Good  Judgement:  Good  Insight:   Present  Psychomotor Activity:  Normal  Concentration:  Concentration: Good  Recall:  Good  Fund of Knowledge:  Good  Language:  Good  Akathisia:  No  Handed:  Right  AIMS (if indicated):     Assets:  Communication Skills Desire for Improvement Financial Resources/Insurance Housing Leisure Time Resilience Social Support Talents/Skills Transportation Vocational/Educational  ADL's:  Intact  Cognition:  WNL  Sleep:        Assessment and Plan: Depression screen [facility] 2/9 09/25/2021 07/31/2021 05/08/2021 04/29/2020 08/17/2019  Decreased Interest 0 1 0 1 0  Down, Depressed, Hopeless 1 3 1 1  0  PHQ - 2 Score 1 4 1 2  0  Altered sleeping 0 0 - 1 -  Tired, decreased energy 0 0 - 1 -  Change in appetite 0 0 - 3 -  Feeling bad or failure about yourself  0 0 - 0 -  Trouble concentrating 0 1 - 2 -  Moving slowly or fidgety/restless 0 0 - 1 -  Suicidal thoughts 0 0 - 0 -  PHQ-9 Score 1 5 - 10 -  Difficult doing work/chores Not difficult at all Somewhat difficult - Not difficult at all -  Some recent data might be hidden    Flowsheet Row Video Visit from 09/25/2021 in [place] [facility] Video Visit from 07/31/2021 in [place] [facility] Video Visit from 05/08/2021 in [place] No Risk No Risk No Risk      -  she is engaging in therapy and finds it beneficial.   - she continues to deny any symptoms of gynecomastia.   - ordered EKG  1. Schizophrenia, disorganized type ([place]) - benztropine (COGENTIN) 1 MG tablet; Take 1 tablet (1 mg total) by mouth 2 (two) times daily.  Dispense: 180 tablet; Refill: 0 - OLANZapine zydis (ZYPREXA) 20 MG disintegrating tablet; Take 1 tablet (20 mg total) by mouth at bedtime.  Dispense: 90 tablet; Refill: 0  2. MDD (major depressive disorder), recurrent episode, moderate (HCC) - OLANZapine zydis (ZYPREXA) 20 MG disintegrating  tablet; Take 1 tablet (20 mg total) by mouth at bedtime.  Dispense: 90 tablet; Refill: 0   Follow Up Instructions: In 2-3 months or sooner if needed   I discussed the assessment and treatment plan with the patient. The patient was provided an opportunity to ask questions and all were answered. The patient agreed with the plan and demonstrated an understanding of the instructions.   The patient was advised to call back or seek an in-person evaluation if the symptoms worsen or if the condition fails to improve as anticipated.  I provided 16 minutes of non-face-to-face time during this encounter.   [name], MD

## 2021-10-16 ENCOUNTER — Ambulatory Visit (HOSPITAL_COMMUNITY): Payer: Federal, State, Local not specified - PPO | Admitting: Licensed Clinical Social Worker

## 2021-11-11 ENCOUNTER — Other Ambulatory Visit (HOSPITAL_COMMUNITY)
Admission: RE | Admit: 2021-11-11 | Discharge: 2021-11-11 | Disposition: A | Discharge status: Home / Self Care | Payer: Federal, State, Local not specified - PPO | Source: Ambulatory Visit | Attending: Family Medicine | Admitting: Family Medicine

## 2021-11-11 ENCOUNTER — Ambulatory Visit (INDEPENDENT_AMBULATORY_CARE_PROVIDER_SITE_OTHER): Payer: Federal, State, Local not specified - PPO | Admitting: Family Medicine

## 2021-11-11 ENCOUNTER — Other Ambulatory Visit: Payer: Self-pay

## 2021-11-11 ENCOUNTER — Encounter: Payer: Self-pay | Admitting: Family Medicine

## 2021-11-11 VITALS — BP 125/75 | HR 90 | Ht 59.0 in | Wt 189.2 lb

## 2021-11-11 DIAGNOSIS — Z23 Encounter for immunization: Secondary | ICD-10-CM | POA: Diagnosis not present

## 2021-11-11 DIAGNOSIS — Z975 Presence of (intrauterine) contraceptive device: Secondary | ICD-10-CM | POA: Diagnosis not present

## 2021-11-11 DIAGNOSIS — K581 Irritable bowel syndrome with constipation: Secondary | ICD-10-CM | POA: Diagnosis not present

## 2021-11-11 DIAGNOSIS — N898 Other specified noninflammatory disorders of vagina: Secondary | ICD-10-CM | POA: Diagnosis not present

## 2021-11-11 DIAGNOSIS — Z124 Encounter for screening for malignant neoplasm of cervix: Secondary | ICD-10-CM | POA: Diagnosis not present

## 2021-11-11 LAB — POCT WET PREP (WET MOUNT)
Clue Cells Wet Prep Whiff POC: NEGATIVE
Trichomonas Wet Prep HPF POC: ABSENT

## 2021-11-11 MED ORDER — POLYETHYLENE GLYCOL 3350 17 GM/SCOOP PO POWD: 17.0000 g | Freq: Every day | ORAL | 2 refills | Status: DC | PRN

## 2021-11-11 NOTE — Patient Instructions (Addendum)
It was great to meet you!  -Today we did your Pap smear, which is a screening test for cervical cancer. We also checked for bacterial overgrowth (BV) and yeast. We will send you a MyChart message with the results or call if they are abnormal.  -We also did your flu shot. It's normal to have a sore arm, feel fatigued, or even have a low grade fever for 24-36 hours after the vaccine. You can take Tylenol as needed.  -I have sent Miralax refills to your pharmacy  -Your Mirena is good for 8 years, which means you aren't due for removal until 2025. If you decide you'd like removal sooner and would like to restart Sprintec instead, let us know.  Take care and seek immediate care sooner if you develop any concerns.  Dr. [facility]

## 2021-11-11 NOTE — Progress Notes (Signed)
° ° °  SUBJECTIVE:   CHIEF COMPLAINT / HPI:   Vaginal Discharge Patient reports malodorous, white vaginal discharge. Notes she has had it off and on since her IUD was placed back in 2017. This episode has been ongoing for the past few weeks. She has tried garlic, apple cider vinegar, and yogurt with no change in symptoms. No significant dysuria or urinary symptoms. She has never been sexually active. She does have a history of BV once in the past.  Health Maintenance Due for Pap-- has never had Pap before. Also due for Flu shot  PERTINENT  PMH / PSH: schizophrenia w/depression, obesity, PCOS, asthma, HLD  OBJECTIVE:   BP 125/75    Pulse 90    Ht 4\' 11"  (1.499 m)    Wt 189 lb 4 oz (85.8 kg)    SpO2 100%    BMI 38.22 kg/m   General: NAD, pleasant, able to participate in exam Respiratory: No respiratory distress Skin: warm and dry, no rashes noted Psych: Normal affect and mood Neuro: grossly intact GU/GYN: Exam performed in the presence of a chaperone. External genitalia within normal limits.  Vaginal mucosa pink, moist, normal rugae.  Nonfriable cervix without lesions, no discharge or bleeding noted on speculum exam.  IUD strings visualized. Bimanual exam revealed normal, nongravid uterus.  No cervical motion tenderness. No adnexal masses bilaterally.     ASSESSMENT/PLAN:   Vaginal discharge Occurs intermittently over past 5 years since IUD placement. Current episode present for a few weeks. No abnormality appreciated on pelvic exam today. Wet prep negative for BV or yeast. Never been sexually active so GC/chlamydia was deferred. May be physiologic discharge. Return if worsening   Health Maintenance -Pap smear completed today (w/HPV if ASCUS) -Flu shot given -UTD on remainder of health maintenance  Elevated PHQ-9, Hx of schizophrenia w/depression Patient's PHQ-9 score significantly elevated with a positive response to question #9. Endorses passive SI, which has been longstanding.  Follows very closely with psychiatry- has upcoming appt January 12th.    Alcus Dad, MD [place]

## 2021-11-11 NOTE — Assessment & Plan Note (Addendum)
Occurs intermittently over past 5 years since IUD placement. Current episode present for a few weeks. No abnormality appreciated on pelvic exam today. Wet prep negative for BV or yeast. Never been sexually active so GC/chlamydia was deferred. May be physiologic discharge. Return if worsening

## 2021-11-12 LAB — CYTOLOGY - PAP: Diagnosis: NEGATIVE

## 2021-11-20 ENCOUNTER — Ambulatory Visit (HOSPITAL_COMMUNITY): Payer: Federal, State, Local not specified - PPO | Admitting: Licensed Clinical Social Worker

## 2021-11-22 ENCOUNTER — Emergency Department (HOSPITAL_BASED_OUTPATIENT_CLINIC_OR_DEPARTMENT_OTHER)
Admission: EM | Admit: 2021-11-22 | Discharge: 2021-11-22 | Disposition: A | Discharge status: Home / Self Care | Payer: Federal, State, Local not specified - PPO | Attending: Emergency Medicine | Admitting: Emergency Medicine

## 2021-11-22 ENCOUNTER — Other Ambulatory Visit: Payer: Self-pay

## 2021-11-22 DIAGNOSIS — R059 Cough, unspecified: Secondary | ICD-10-CM | POA: Diagnosis not present

## 2021-11-22 DIAGNOSIS — U071 COVID-19: Secondary | ICD-10-CM | POA: Diagnosis not present

## 2021-11-22 LAB — RESP PANEL BY RT-PCR (FLU A&B, COVID) ARPGX2
Influenza A by PCR: NEGATIVE
Influenza B by PCR: NEGATIVE
SARS Coronavirus 2 by RT PCR: POSITIVE — AB

## 2021-11-22 MED ORDER — BENZONATATE 100 MG PO CAPS: 100.0000 mg | ORAL_CAPSULE | Freq: Three times a day (TID) | ORAL | 0 refills | Status: DC

## 2021-11-22 MED ORDER — ALBUTEROL SULFATE HFA 108 (90 BASE) MCG/ACT IN AERS: 1.0000 | INHALATION_SPRAY | Freq: Four times a day (QID) | RESPIRATORY_TRACT | 0 refills | Status: AC | PRN

## 2021-11-22 MED ORDER — FLUTICASONE PROPIONATE 50 MCG/ACT NA SUSP: 2.0000 | Freq: Every day | NASAL | 0 refills | Status: DC

## 2021-11-22 MED ORDER — NAPROXEN 500 MG PO TABS: 500.0000 mg | ORAL_TABLET | Freq: Two times a day (BID) | ORAL | 0 refills | Status: DC

## 2021-11-22 NOTE — ED Provider Notes (Signed)
[facility] EMERGENCY DEPARTMENT Provider Note   CSN: [id number] Arrival date & time: 11/22/21  1024     History  Chief Complaint  Patient presents with   Cough    Natalie Wagner is a 26 y.o. female here for evaluation of upper respiratory complaints.  Yesterday began to have cough, congestion, rhinorrhea, scratchy throat, myalgias, shortness of breath when she coughs.  Took OTC medications without relief.  Has had multiple sick contacts.  Low-grade fever at home.  Some mild aching headache.  No recent head trauma, sudden onset thunderclap headache, unilateral weakness.  She is tolerating p.o. intake.  No hemoptysis, lower extremity swelling, abdominal pain, emesis.  HPI     Home Medications Prior to Admission medications   Medication Sig Start Date End Date Taking? Authorizing Provider  albuterol (VENTOLIN HFA) 108 (90 Base) MCG/ACT inhaler Inhale 1-2 puffs into the lungs every 6 (six) hours as needed for wheezing or shortness of breath. 11/22/21  Yes Evva Din A, PA-C  benzonatate (TESSALON) 100 MG capsule Take 1 capsule (100 mg total) by mouth every 8 (eight) hours. 11/22/21  Yes Yehya Brendle A, PA-C  fluticasone (FLONASE) 50 MCG/ACT nasal spray Place 2 sprays into both nostrils daily. 11/22/21  Yes Pang Robers A, PA-C  naproxen (NAPROSYN) 500 MG tablet Take 1 tablet (500 mg total) by mouth 2 (two) times daily. 11/22/21  Yes Jalaiyah Throgmorton A, PA-C  benztropine (COGENTIN) 1 MG tablet Take 1 tablet (1 mg total) by mouth 2 (two) times daily. 09/25/21   [name], MD  cetirizine (ZYRTEC ALLERGY) 10 MG tablet Take 1 tablet (10 mg total) by mouth daily. 11/24/19   Hall-Potvin, [place], PA-C  hyoscyamine (LEVSIN SL) 0.125 MG SL tablet Place 1 tablet (0.125 mg total) under the tongue in the morning and at bedtime. 11/28/20   Willia Craze, NP  levonorgestrel (MIRENA) 20 MCG/24HR IUD 1 Intra Uterine Device (1 each total) by Intrauterine route once.  09/28/16 09/25/21  Parthenia Ames, NP  Melatonin 5 MG TABS Take 1 tablet by mouth daily as needed (sleep).     [provider]  OLANZapine zydis (ZYPREXA) 20 MG disintegrating tablet Take 1 tablet (20 mg total) by mouth at bedtime. 09/25/21   [name], MD  polyethylene glycol powder [facility]) 17 GM/SCOOP powder Take 17 g by mouth daily as needed. 11/11/21   Alcus Dad, MD      Allergies    Lactose intolerance (gi)    Review of Systems   Review of Systems  Constitutional:  Positive for activity change, appetite change, chills and fatigue.  HENT:  Positive for congestion, postnasal drip, rhinorrhea, sneezing and sore throat. Negative for sinus pressure, sinus pain, trouble swallowing and voice change.   Respiratory:  Positive for cough and shortness of breath (with cough). Negative for apnea, choking, chest tightness, wheezing and stridor.   Cardiovascular: Negative.   Gastrointestinal: Negative.   Genitourinary: Negative.   Musculoskeletal:  Positive for myalgias.  Skin: Negative.   Neurological:  Positive for weakness (generalized) and headaches. Negative for dizziness, seizures, syncope, speech difficulty, light-headedness and numbness.  All other systems reviewed and are negative.  Physical Exam Updated Vital Signs BP 136/83 (BP Location: Left Arm)    Pulse 95    Temp 99.9 F (37.7 C) (Oral)    Resp 18    LMP 11/17/2021    SpO2 97%  Physical Exam Vitals and nursing note reviewed.  Constitutional:      General:  She is not in acute distress.    Appearance: She is well-developed. She is not ill-appearing, toxic-appearing or diaphoretic.  HENT:     Head: Normocephalic and atraumatic.     Nose: Congestion and rhinorrhea present.     Mouth/Throat:     Comments: PO clear, no erythema, exudate, Uvula midline. No obvious PTA, RPA Eyes:     Pupils: Pupils are equal, round, and reactive to light.  Neck:     Comments: NO neck stiffness, neck rigidity, Full  ROM Cardiovascular:     Rate and Rhythm: Normal rate.     Pulses: Normal pulses.     Heart sounds: Normal heart sounds.  Pulmonary:     Effort: Pulmonary effort is normal. No respiratory distress.     Breath sounds: Normal breath sounds.     Comments: Clear bilaterally, speaks in full sentences without difficulty Abdominal:     General: Bowel sounds are normal. There is no distension.     Palpations: Abdomen is soft.     Tenderness: There is no abdominal tenderness. There is no guarding or rebound.  Musculoskeletal:        General: No swelling, tenderness, deformity or signs of injury. Normal range of motion.     Cervical back: Normal range of motion.  Skin:    General: Skin is warm and dry.     Capillary Refill: Capillary refill takes less than 2 seconds.  Neurological:     General: No focal deficit present.     Mental Status: She is alert and oriented to person, place, and time.     Comments: Ambulatory in room without difficulty CN 2-12 grossly intact  Psychiatric:        Mood and Affect: Mood normal.    ED Results / Procedures / Treatments   Labs (all labs ordered are listed, but only abnormal results are displayed) Labs Reviewed  RESP PANEL BY RT-PCR (FLU A&B, COVID) ARPGX2 - Abnormal; Notable for the following components:      Result Value   SARS Coronavirus 2 by RT PCR POSITIVE (*)    All other components within normal limits    EKG None  Radiology No results found.  Procedures Procedures    Medications Ordered in ED Medications - No data to display  ED Course/ Medical Decision Making/ A&P    Pleasant 26 year old here for evaluation of respiratory complaints.  She is afebrile, nonseptic, not ill-appearing.  Heart and lungs clear.  Low suspicion for bacterial pneumonia, pneumothorax, edema.  Abdomen soft, nontender.  She has a nonfocal neuro exam without deficits.  Tolerating p.o. intake.  P.o. clear.  Low suspicion for PTA, RPA, strep pharyngitis.  Family  has similar symptoms as well.  Labs personally reviewed and interpreted: Flu negative COVID positive  Discussed results with family as well as patient in room.  DC home with symptomatic management, close outpatient follow-up.  Work note provided.  Patient agreeable to return for new or worsening symptoms.  The patient has been appropriately medically screened and/or stabilized in the ED. I have low suspicion for any other emergent medical condition which would require further screening, evaluation or treatment in the ED or require inpatient management.  Patient is hemodynamically stable and in no acute distress.  Patient able to ambulate in department prior to ED.  Evaluation does not show acute pathology that would require ongoing or additional emergent interventions while in the emergency department or further inpatient treatment.  I have discussed the diagnosis with  the patient and answered all questions.  Pain is been managed while in the emergency department and patient has no further complaints prior to discharge.  Patient is comfortable with plan discussed in room and is stable for discharge at this time.  I have discussed strict return precautions for returning to the emergency department.  Patient was encouraged to follow-up with PCP/specialist refer to at discharge.                            Medical Decision Making Amount and/or Complexity of Data Reviewed Independent Historian: parent    Details: mother in room External Data Reviewed: labs and notes.    Details: hx of schizophrenia, normal behavior here low suspicion for psychiatric crisis Labs: ordered. Decision-making details documented in ED Course.  Risk OTC drugs. Prescription drug management. Diagnosis or treatment significantly limited by social determinants of health. Risk Details: Psych hx           Final Clinical Impression(s) / ED Diagnoses Final diagnoses:  COVID    Rx / DC Orders ED Discharge Orders           Ordered    benzonatate (TESSALON) 100 MG capsule  Every 8 hours        11/22/21 1209    fluticasone (FLONASE) 50 MCG/ACT nasal spray  Daily        11/22/21 1209    albuterol (VENTOLIN HFA) 108 (90 Base) MCG/ACT inhaler  Every 6 hours PRN        11/22/21 1209    naproxen (NAPROSYN) 500 MG tablet  2 times daily        11/22/21 1209              Kyser Wandel A, PA-C 11/22/21 1216    Malvin Johns, MD 11/22/21 1224

## 2021-11-22 NOTE — Discharge Instructions (Addendum)
Take the medications as prescribed for your symptoms  Return for new or worsening symptoms

## 2021-11-22 NOTE — ED Triage Notes (Signed)
Pt arrives pov with c/o sore throat, cough. Sneezing and body aches x 1 week

## 2021-11-25 ENCOUNTER — Other Ambulatory Visit: Payer: Self-pay

## 2021-11-25 ENCOUNTER — Ambulatory Visit (INDEPENDENT_AMBULATORY_CARE_PROVIDER_SITE_OTHER): Payer: Federal, State, Local not specified - PPO | Admitting: Family Medicine

## 2021-11-25 VITALS — BP 121/77 | HR 82 | Temp 99.9°F | Ht 59.0 in | Wt 189.4 lb

## 2021-11-25 DIAGNOSIS — U071 COVID-19: Secondary | ICD-10-CM

## 2021-11-25 DIAGNOSIS — R051 Acute cough: Secondary | ICD-10-CM | POA: Diagnosis not present

## 2021-11-25 MED ORDER — BENZONATATE 100 MG PO CAPS: 100.0000 mg | ORAL_CAPSULE | Freq: Three times a day (TID) | ORAL | 0 refills | Status: DC

## 2021-11-25 MED ORDER — CEPACOL SORE THROAT & COUGH 5-7.5 MG MT LOZG: 1.0000 | LOZENGE | OROMUCOSAL | 0 refills | Status: DC | PRN

## 2021-11-25 NOTE — Patient Instructions (Signed)
It was wonderful to meet you today. Thank you for allowing me to be a part of your care. Below is a short summary of what we discussed at your visit today:  COVID-19 illness Unfortunately, you are outside of the window to start back Paxlovid.  This means we will simply treat your symptoms to make you more comfortable.  Because her symptoms started on the 10th or 11th, you are outside of the quarantine window for staying home.  You will need to continue wearing a mask everywhere you go and around other people who do not live with you through Friday 1/20.  Below are some following tips and tricks to help with your symptoms: - Drink lots of fluids including water, tea, broth, Gatorade, and Pedialyte - Use throat lozenges with a numbing effect such as Cepacol to help with your sore throat - Breathing steam whenever possible especially if someone is taking a hot steamy shower - Use a humidifier next to your bed - Since your symptoms started on the 14th, will need to quarantine at home until Thursday 1/19.  After that, you will need to wear a mask everywhere you go for the next 5 days. -You may alternate Tylenol and ibuprofen as needed for fever and muscle aches - Use heating pads, ice packs, and bubble baths to help with your muscle aches    Please bring all of your medications to every appointment!  If you have any questions or concerns, please do not hesitate to contact us via phone or MyChart message.   Ezequiel Essex, MD

## 2021-11-27 DIAGNOSIS — U071 COVID-19: Secondary | ICD-10-CM | POA: Insufficient documentation

## 2021-11-27 NOTE — Progress Notes (Signed)
° ° °  SUBJECTIVE:   CHIEF COMPLAINT / HPI:   COVID URI - diagnosed COVID positive in ED 1/13 - sxs started 1/10 or 1/11 - experiencing HA, woozy head, diarrhea, cough, sneezing, SOB, fever, los of smell and taste - tried tessalon perls with no relief - not vaccinated against COVID - did not get Paxlovid - eating and drinking normal - coming in today because she is not getting any better  PERTINENT  PMH / PSH:  Patient Active Problem List   Diagnosis Date Noted   COVID 11/27/2021   IUD (intrauterine device) in place 11/11/2021   Sleep apnea 02/10/2018   Vaginal discharge 03/22/2017   Obesity 03/02/2017   Mild intermittent asthma, uncomplicated 96/75/9163   Tension-type headache, not intractable 10/21/2016   Convulsions ([place]) 09/03/2015   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   Hyperlipidemia LDL goal <130 05/23/2014   Lactose intolerance documented with breath testing 10/25/2013   Irritable bowel syndrome 05/23/2013   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   Allergic rhinitis 05/05/2013   PCOS (polycystic ovarian syndrome) 05/05/2013     OBJECTIVE:   BP 121/77    Pulse 82    Temp 99.9 F (37.7 C) (Oral)    Ht 4\' 11"  (1.499 m)    Wt 189 lb 6.4 oz (85.9 kg)    LMP 11/17/2021    SpO2 100%    BMI 38.25 kg/m    PHQ-9:  Depression screen [facility] 2/9 11/25/2021 11/11/2021 04/29/2020  Decreased Interest 1 1 1   Down, Depressed, Hopeless 1 0 1  PHQ - 2 Score 2 1 2   Altered sleeping 1 2 1   Tired, decreased energy 2 1 1   Change in appetite 1 3 3   Feeling bad or failure about yourself  0 1 0  Trouble concentrating 2 2 2   Moving slowly or fidgety/restless 2 2 1   Suicidal thoughts 0 2 0  PHQ-9 Score 10 14 10   Difficult doing work/chores - Somewhat difficult Not difficult at all  Some encounter information is confidential and restricted. Go to Review Flowsheets activity to see all data.  Some recent data might be hidden     GAD-7: No flowsheet data found.   Physical Exam General:  Awake, alert, oriented HEENT: Oral mucosa moist and without lesion Cardiovascular: Regular rate and rhythm, S1 and S2 present, no murmurs auscultated, brisk cap refill, no skin tenting Respiratory: Lung fields clear to auscultation bilaterally  ASSESSMENT/PLAN:   COVID Acute. COVID positive in Ed on 1/13. Patient reports no improvement in symptoms. Today appears non-toxic and well hydrated. Out of window to initiate Paxlovid. Discussed symptomatic conservative treatment. Return precautions given. Rx cepachol, tessalon.  See AVS for more.     Ezequiel Essex, MD [place]

## 2021-11-27 NOTE — Assessment & Plan Note (Signed)
Acute. COVID positive in Ed on 1/13. Patient reports no improvement in symptoms. Today appears non-toxic and well hydrated. Out of window to initiate Paxlovid. Discussed symptomatic conservative treatment. Return precautions given. Rx cepachol, tessalon.  See AVS for more.

## 2021-12-04 ENCOUNTER — Ambulatory Visit (HOSPITAL_COMMUNITY): Payer: Federal, State, Local not specified - PPO | Admitting: Licensed Clinical Social Worker

## 2021-12-18 ENCOUNTER — Ambulatory Visit (HOSPITAL_COMMUNITY): Payer: Federal, State, Local not specified - PPO | Admitting: Licensed Clinical Social Worker

## 2021-12-25 ENCOUNTER — Other Ambulatory Visit: Payer: Self-pay

## 2021-12-25 ENCOUNTER — Encounter (HOSPITAL_COMMUNITY): Payer: Self-pay | Admitting: Psychiatry

## 2021-12-25 ENCOUNTER — Telehealth (HOSPITAL_BASED_OUTPATIENT_CLINIC_OR_DEPARTMENT_OTHER): Payer: Federal, State, Local not specified - PPO | Admitting: Psychiatry

## 2021-12-25 DIAGNOSIS — F331 Major depressive disorder, recurrent, moderate: Secondary | ICD-10-CM

## 2021-12-25 DIAGNOSIS — F201 Disorganized schizophrenia: Secondary | ICD-10-CM | POA: Diagnosis not present

## 2021-12-25 MED ORDER — BENZTROPINE MESYLATE 1 MG PO TABS: 1.0000 mg | ORAL_TABLET | Freq: Two times a day (BID) | ORAL | 0 refills | Status: DC

## 2021-12-25 MED ORDER — OLANZAPINE 20 MG PO TBDP: 20.0000 mg | ORAL_TABLET | Freq: Every day | ORAL | 0 refills | Status: DC

## 2021-12-25 NOTE — Progress Notes (Signed)
Virtual Visit via Video Note  I connected with [name] on 12/25/21 at 10:30 AM EST by a video enabled telemedicine application and verified that I am speaking with the correct person using two identifiers.  Location: Patient: home Provider: office   I discussed the limitations of evaluation and management by telemedicine and the availability of in person appointments. The patient expressed understanding and agreed to proceed.  History of Present Illness: Savannah shares she is doing well. She is getting new glasses today. Her anxiety is mostly situational and manageable when it comes on. Her depression comes and goes through out the week.It is not debilitating or overwhelming. She denies anhedonia and isolation. Her sleeps is good and she is getting 8-9 hrs/night. Her energy, concentration and appetite are good. She denies worthlessness. Smith denies SI/HI. She denies hallucinations, paranoia, idea of reference and magical thinking. Her meds are effective.    Observations/Objective: Psychiatric Specialty Exam: ROS  There were no vitals taken for this visit.There is no height or weight on file to calculate BMI.  General Appearance: Casual and Fairly Groomed  Eye Contact:  Good  Speech:  Clear and Coherent and Normal Rate  Volume:  Normal  Mood:  Euthymic  Affect:  Blunt  Thought Process: concrete Coherent, Linear, and Descriptions of Associations: Intact  Orientation:  Full (Time, Place, and Person)  Thought Content:  Logical  Suicidal Thoughts:  No  Homicidal Thoughts:  No  Memory:  Immediate;   Good  Judgement:  Good  Insight:  Good  Psychomotor Activity:  Normal  Concentration:  Concentration: Good  Recall:  Good  Fund of Knowledge:  Good  Language:  Good  Akathisia:  No  Handed:  Right  AIMS (if indicated):     Assets:  Communication Skills Desire for Improvement Financial Resources/Insurance Housing Resilience Social  Support Talents/Skills Transportation Vocational/Educational  ADL's:  Intact  Cognition:  WNL  Sleep:        Assessment and Plan: Depression screen [facility] 2/9 12/25/2021 11/25/2021 11/11/2021 09/25/2021 07/31/2021  Decreased Interest 0 1 1 0 1  Down, Depressed, Hopeless 2 1 0 1 3  PHQ - 2 Score 2 2 1 1 4   Altered sleeping 0 1 2 0 0  Tired, decreased energy 0 2 1 0 0  Change in appetite 0 1 3 0 0  Feeling bad or failure about yourself  1 0 1 0 0  Trouble concentrating 0 2 2 0 1  Moving slowly or fidgety/restless 0 2 2 0 0  Suicidal thoughts 0 0 2 0 0  PHQ-9 Score 3 10 14 1 5   Difficult doing work/chores Not difficult at all - Somewhat difficult Not difficult at all Somewhat difficult  Some recent data might be hidden    Flowsheet Row Video Visit from 12/25/2021 in [facility] ED from 11/22/2021 in [place] Video Visit from 09/25/2021 in [facility]  [place] No Risk No Risk No Risk      - next labs due in June 2023 - she will schedule her EKG with her PCP  - she notes some arm jerking and finger twitching now that she decreased Cogentin to qD. I recommended she go back up to BID.   1. Schizophrenia, disorganized type (HCC) - OLANZapine zydis (ZYPREXA) 20 MG disintegrating tablet; Take 1 tablet (20 mg total) by mouth at bedtime.  Dispense: 90 tablet; Refill: 0 - benztropine (COGENTIN) 1 MG tablet;  Take 1 tablet (1 mg total) by mouth 2 (two) times daily.  Dispense: 180 tablet; Refill: 0  2. MDD (major depressive disorder), recurrent episode, moderate (HCC) - OLANZapine zydis (ZYPREXA) 20 MG disintegrating tablet; Take 1 tablet (20 mg total) by mouth at bedtime.  Dispense: 90 tablet; Refill: 0    Follow Up Instructions: In 2-3 months or sooner if needed   I discussed the assessment and treatment plan with the patient. The patient was provided an opportunity  to ask questions and all were answered. The patient agreed with the plan and demonstrated an understanding of the instructions.   The patient was advised to call back or seek an in-person evaluation if the symptoms worsen or if the condition fails to improve as anticipated.  I provided 15 minutes of non-face-to-face time during this encounter.   [name], MD

## 2022-01-01 ENCOUNTER — Ambulatory Visit (INDEPENDENT_AMBULATORY_CARE_PROVIDER_SITE_OTHER): Payer: Federal, State, Local not specified - PPO | Admitting: Licensed Clinical Social Worker

## 2022-01-01 ENCOUNTER — Other Ambulatory Visit: Payer: Self-pay

## 2022-01-01 ENCOUNTER — Encounter (HOSPITAL_COMMUNITY): Payer: Self-pay | Admitting: Licensed Clinical Social Worker

## 2022-01-01 DIAGNOSIS — F201 Disorganized schizophrenia: Secondary | ICD-10-CM

## 2022-01-01 NOTE — Progress Notes (Signed)
Virtual Visit via Video Note  I connected with [name] on 01/01/22 at 11:00 AM EST by a video enabled telemedicine application and verified that I am speaking with the correct person using two identifiers.  Location: Patient: home Provider: home office   I discussed the limitations of evaluation and management by telemedicine and the availability of in person appointments. The patient expressed understanding and agreed to proceed.    I discussed the assessment and treatment plan with the patient. The patient was provided an opportunity to ask questions and all were answered. The patient agreed with the plan and demonstrated an understanding of the instructions.   The patient was advised to call back or seek an in-person evaluation if the symptoms worsen or if the condition fails to improve as anticipated.  I provided 40 minutes of non-face-to-face time during this encounter.   [name], LCSW   THERAPIST PROGRESS NOTE  Session Time: 11:00am-11:40am  Participation Level: Active  Behavioral Response: Well GroomedAlertEuthymic  Type of Therapy: Individual Therapy  Treatment Goals addressed: "I want to work on my anxiety, depression, self-confidence, and my ability to socialize". Maronda will report reduced depression and anxiety 5 out of 7 days.   ProgressTowards Goals: Progressing  Interventions: CBT  Summary: CASHA ESTUPINAN is a 26 y.o. female who presents with Schizophrenia, Disorganized type.   Suicidal/Homicidal: Nowithout intent/plan  Therapist Response: Clinician engaged with Loma Sousa in an individual telehealth session. Clinician utilized CBT to process thoughts, feelings, and behaviors over the past few months. Mychele identified increase in self-care behaviors, including joining and attending a gym, participating in organized exercise classes, and working on changing her diet. Clinician explored impact of these bxs on mood and noted that  anxiety has been increased due to being around more people. Clinician challenged Beyonce to continue gradual exposure work by going out more and participating in more social interactions. Clinician discussed updates on family and noted that MGM had a fall and broke her ankle. Kambry shared that she has been offering support for her grandmother, but it is difficult to see her injured. Clinician reviewed coping skills and tapping. Clinician encouraged practicing coping skills daily so she will be able to use them when needed.   Plan: Return again in 4 weeks.  Diagnosis: Schizophrenia, disorganized type ([place])  Collaboration of Care: Other none required at this session.  Patient/Guardian was advised Release of Information must be obtained prior to any record release in order to collaborate their care with an outside provider. Patient/Guardian was advised if they have not already done so to contact the registration department to sign all necessary forms in order for [place] to release information regarding their care.   Consent: Patient/Guardian gives verbal consent for treatment and assignment of benefits for services provided during this visit. Patient/Guardian expressed understanding and agreed to proceed.   Jobe Marker Wright, LCSW 01/01/2022

## 2022-01-08 ENCOUNTER — Telehealth (HOSPITAL_COMMUNITY): Payer: Self-pay

## 2022-01-08 NOTE — Telephone Encounter (Signed)
Writer received paperwork regarding this patient requesting a medical certificate be filled out stating that this patient is incapable of self-support in order to stay on her mother's insurance. Writer s/w pt's provider. Per Dr. [name], we do not fill out this type of paperwork. It states that a "medical certificate" needs to be completed. Writer spoke with pt's mother to let her know that the form to be filled out would need to be provided by the [employer] employer ([place] [employer]) and then the paperwork taken to her PCP for them to fill out ?

## 2022-01-29 ENCOUNTER — Ambulatory Visit (HOSPITAL_COMMUNITY): Payer: Federal, State, Local not specified - PPO | Admitting: Licensed Clinical Social Worker

## 2022-02-11 ENCOUNTER — Telehealth (HOSPITAL_COMMUNITY): Payer: Self-pay

## 2022-02-11 NOTE — Telephone Encounter (Signed)
Patien'ts mother came to the office today regarding pt's paperwork that was discussed on 3/2. Per documentation from 3/2, Dr. [name] stated that she couldn't fill it out and included her recommendations per note. Writer s/w pt's mother on 3/2 to relay the message and the mother stated that she was going to come and pick up the paperwork. It sat on writer's printer for weeks. The mother finally came today, 44/5, to pick it up and stated that she was never told Dr. [name] couldn't do paperwork. Pt's mother became very agitated & angry. She asked Sunday Spillers what she needed to do to change providers ?

## 2022-02-13 ENCOUNTER — Encounter (HOSPITAL_COMMUNITY): Payer: Self-pay | Admitting: Psychiatry

## 2022-02-19 ENCOUNTER — Ambulatory Visit (HOSPITAL_COMMUNITY): Payer: Federal, State, Local not specified - PPO | Admitting: Licensed Clinical Social Worker

## 2022-03-26 ENCOUNTER — Telehealth (HOSPITAL_BASED_OUTPATIENT_CLINIC_OR_DEPARTMENT_OTHER): Payer: Federal, State, Local not specified - PPO | Admitting: Psychiatry

## 2022-03-26 DIAGNOSIS — Z79899 Other long term (current) drug therapy: Secondary | ICD-10-CM | POA: Diagnosis not present

## 2022-03-26 DIAGNOSIS — F331 Major depressive disorder, recurrent, moderate: Secondary | ICD-10-CM

## 2022-03-26 DIAGNOSIS — F201 Disorganized schizophrenia: Secondary | ICD-10-CM | POA: Diagnosis not present

## 2022-03-26 MED ORDER — BENZTROPINE MESYLATE 1 MG PO TABS: 1.0000 mg | ORAL_TABLET | Freq: Two times a day (BID) | ORAL | 0 refills | Status: DC

## 2022-03-26 MED ORDER — OLANZAPINE 20 MG PO TBDP: 20.0000 mg | ORAL_TABLET | Freq: Every day | ORAL | 0 refills | Status: DC

## 2022-03-26 NOTE — Progress Notes (Signed)
Virtual Visit via Video Note  I connected with Natalie Wagner on 03/26/22 at 11:00 AM EDT by a video enabled telemedicine application and verified that I am speaking with the correct person using two identifiers.  Location: Patient: home Provider: office   I discussed the limitations of evaluation and management by telemedicine and the availability of in person appointments. The patient expressed understanding and agreed to proceed.  History of Present Illness: Natalie Wagner is doing well. She wants to now if she is able to get a form signed to get transportation for school or job. She gets lost easily and worried about how to get places. She has been keeping busy by gardening and cooking. Natalie Wagner has been feeling down and getting critical about herself. She is staying more to herself. She is not accomplishing any of her goals. This has been going on for a while and occurs several times a week.. She is trying to come out of it by exercising and making some healthy food choices. She is now down to 183 lbs. Going out side and gardening and walking also help. Her sleep, appetite and energy are good. She denies SI/HI. Her paranoia is ongoing. She has felt this way since she got COVID again in January. She denise AVH and ideas of reference. She has not been in therapy since Feb but plans to restart in July. She has only been taking Cogentin once daily due to BID causing upset stomach. She has jerking moves at night several times a week and severe leg cramps.. It is usually random.   Observations/Objective: Psychiatric Specialty Exam: ROS  There were no vitals taken for this visit.There is no height or weight on file to calculate BMI.  General Appearance: Neat and Well Groomed  Eye Contact:  Good  Speech:  Clear and Coherent and Slow  Volume:  Normal  Mood:  euthymic  Affect:   mildly elevated  Thought Process:  concrete Coherent and Descriptions of Associations: Intact  Orientation:  Full (Time,  Place, and Person)  Thought Content:  Logical  Suicidal Thoughts:  No  Homicidal Thoughts:  No  Memory:  Immediate;   Good  Judgement:  Fair  Insight:  Shallow  Psychomotor Activity:  Normal  Concentration:  Concentration: Good  Recall:  Good  Fund of Knowledge:  Good  Language:  Good  Akathisia:  No  Handed:  Right  AIMS (if indicated):     Assets:  Communication Skills Desire for Improvement Financial Resources/Insurance Housing Resilience Social Support Talents/Skills Vocational/Educational  ADL's:  Intact  Cognition:  WNL  Sleep:        Assessment and Plan:     12/25/2021   10:39 AM 11/25/2021   10:23 AM 11/11/2021    8:30 AM 09/25/2021    9:30 AM 07/31/2021    1:39 PM  Depression screen PHQ 2/9  Decreased Interest 0 1 1 0 1  Down, Depressed, Hopeless 2 1 0 1 3  PHQ - 2 Score '2 2 1 1 4  '$ Altered sleeping 0 1 2 0 0  Tired, decreased energy 0 2 1 0 0  Change in appetite 0 1 3 0 0  Feeling bad or failure about yourself  1 0 1 0 0  Trouble concentrating 0 2 2 0 1  Moving slowly or fidgety/restless 0 2 2 0 0  Suicidal thoughts 0 0 2 0 0  PHQ-9 Score '3 10 14 1 5  '$ Difficult doing work/chores Not difficult at all  Somewhat  difficult Not difficult at all Somewhat difficult    Flowsheet Row Video Visit from 12/25/2021 in [facility] from 11/22/2021 in [facility] Video Visit from 09/25/2021 in [facility] No Risk No Risk No Risk        The risk of un-intended pregnancy is low based on the fact that pt reports she is using IUD.Marland Kitchen Pt is aware that these meds carry a teratogenic risk. Pt will discuss plan of action if she does or plans to become pregnant in the future.  Status of current problems: stable  Meds:  1. 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  Dispense: 180 tablet; Refill: 0 - OLANZapine zydis (ZYPREXA) 20 MG disintegrating tablet; Take 1 tablet (20 mg total) by mouth at bedtime.  Dispense: 90 tablet; Refill: 0  2. MDD (major depressive disorder), recurrent episode, moderate (HCC) - OLANZapine zydis (ZYPREXA) 20 MG disintegrating tablet; Take 1 tablet (20 mg total) by mouth at bedtime.  Dispense: 90 tablet; Refill: 0  3. Encounter for long-term current use of high risk medication - CBC - TSH - Lipid panel - Comprehensive metabolic panel - Hemoglobin A1c - Prolactin     Labs: reminded to get EKG with PCP   Therapy: brief supportive therapy provided. Discussed psychosocial stressors in detail.     Collaboration of Care: Referral or follow-up with counselor/therapist AEB restart therapy and set up an appointment with PCP  Patient/Guardian was advised Release of Information must be obtained prior to any record release in order to collaborate their care with an outside provider. Patient/Guardian was advised if they have not already done so to contact the registration department to sign all necessary forms in order for [place] to release information regarding their care.   Consent: Patient/Guardian gives verbal consent for treatment and assignment of benefits for services provided during this visit. Patient/Guardian expressed understanding and agreed to proceed.      Follow Up Instructions: Follow up in 3 months or sooner if needed    I discussed the assessment and treatment plan with the patient. The patient was provided an opportunity to ask questions and all were answered. The patient agreed with the plan and demonstrated an understanding of the instructions.   The patient was advised to call back or seek an in-person evaluation if the symptoms worsen or if the condition fails to improve as anticipated.  I provided 18 minutes of non-face-to-face time during this encounter.   [name], MD

## 2022-04-02 ENCOUNTER — Ambulatory Visit (HOSPITAL_COMMUNITY): Payer: Federal, State, Local not specified - PPO | Admitting: Licensed Clinical Social Worker

## 2022-04-14 ENCOUNTER — Encounter: Payer: Self-pay | Admitting: *Deleted

## 2022-04-29 ENCOUNTER — Telehealth: Payer: Self-pay | Admitting: Nurse Practitioner

## 2022-04-29 NOTE — Telephone Encounter (Signed)
"  Hey [name] I started back taking miralax it worked and I also put fiber in my diet Metamucil products and I was trying to get some ducolax stool softener to easy it out but I didn't know if I needed a prescription for it or an appointment"

## 2022-04-29 NOTE — Telephone Encounter (Signed)
Spoke with pt. Pt states that she has been taking miralax and metamucil and wanted to add dulcolax stool softener because this has worked for her in the past. Pt stated she thought she had to get a prescription for the medication because she was unable to find dulcolax stool softener at her pharmacy. Let pt know that the dulcolax is over the counter and she could get the stool softener at another location if her pharmacy was out. Pt reports last bowel movement was today and stool was hard but pt states that stool softener would help. Does pt need a follow up appt or can she just take the stool softener?

## 2022-04-30 NOTE — Telephone Encounter (Signed)
Gave pt message. Pt verbalized understanding and stated she would let us know if stool softener is not helping. Pt had no other concerns at end of call.

## 2022-05-21 ENCOUNTER — Ambulatory Visit (HOSPITAL_COMMUNITY): Payer: Federal, State, Local not specified - PPO | Admitting: Licensed Clinical Social Worker

## 2022-06-02 ENCOUNTER — Telehealth: Payer: Self-pay | Admitting: Nurse Practitioner

## 2022-06-02 NOTE — Telephone Encounter (Signed)
Pt's message: "Hey [name] I have another problem I been having some anal fissures and I don't know if that's what stopped the laxatives from working. Hey and laxatives are not work I don't know what else to try."

## 2022-06-02 NOTE — Telephone Encounter (Signed)
Pt scheduled for follow up appointment with Ellouise Newer, PA on 06/04/22 at 1:30 pm. Pt verbalized understanding and stated she would call back if appointment is not going to work with her schedule.

## 2022-06-04 ENCOUNTER — Encounter: Payer: Self-pay | Admitting: Physician Assistant

## 2022-06-04 ENCOUNTER — Telehealth: Payer: Self-pay

## 2022-06-04 ENCOUNTER — Ambulatory Visit (INDEPENDENT_AMBULATORY_CARE_PROVIDER_SITE_OTHER): Payer: Federal, State, Local not specified - PPO | Admitting: Physician Assistant

## 2022-06-04 VITALS — BP 122/78 | HR 84 | Ht 59.0 in | Wt 183.0 lb

## 2022-06-04 DIAGNOSIS — K581 Irritable bowel syndrome with constipation: Secondary | ICD-10-CM | POA: Diagnosis not present

## 2022-06-04 MED ORDER — LINACLOTIDE 145 MCG PO CAPS: 145.0000 ug | ORAL_CAPSULE | Freq: Every day | ORAL | 2 refills | Status: DC

## 2022-06-04 NOTE — Patient Instructions (Addendum)
If you are age 26 or older, your body mass index should be between 23-30. Your Body mass index is 36.96 kg/m. If this is out of the aforementioned range listed, please consider follow up with your Primary Care Provider.  If you are age 27 or younger, your body mass index should be between 19-25. Your Body mass index is 36.96 kg/m. If this is out of the aformentioned range listed, please consider follow up with your Primary Care Provider.   ________________________________________________________  The [facility] providers would like to encourage you to use [facility] to communicate with providers for non-urgent requests or questions.  Due to long hold times on the telephone, sending your provider a message by [facility] may be a faster and more efficient way to get a response.  Please allow 48 business hours for a response.  Please remember that this is for non-urgent requests.  _______________________________________________________  We have given you samples of the following medication to take: Linzess 145 mcg # 8   We have sent the following medications to your pharmacy for you to pick up at your convenience: Linzess 145 mcg daily  Follow up in 2 months with either Ellouise Newer, PA-C or Tye Savoy, NP.  It was a pleasure to see you today!  Thank you for trusting me with your gastrointestinal care!

## 2022-06-04 NOTE — Progress Notes (Signed)
Chief Complaint: Chronic constipation  HPI:    Natalie Wagner is a 26 year old African-American female, known to Dr. Silverio Decamp, with a past medical history as listed below including constipation, who was referred to me by Alcus Dad, MD for a complaint of chronic constipation.    11/28/2020 patient seen in clinic by Tye Savoy at that time discussed IBS and chronic constipation.  Noted that MiraLAX caused loose stools and she was managing constipation with increased fruit and water.  Her Levsin was increased to twice daily dosing as needed.  At that time discussed possibly trying Amitiza or Linzess if she did not tolerate MiraLAX in the past for constipation continued.    Today, the patient tells me that she is suffering with constipation for the past 3 years.  She has tried various over-the-counter products including fiber on a daily basis as well as MiraLAX which just is not working for her anymore.  She had a stool softener at 1 point which still never helped.  Tells me that things have never really improved even after seeing Nevin Bloodgood last year.  Tells me it is hard to get out of bowel movement but she tries not to strain as she has had trouble with "tears" in the past.  Describes that when she does have a bowel movement does not feel complete.  Denies any current rectal bleeding or rectal pain.  Associated symptoms include bloating.    Denies fever, chills, weight loss or blood in her stool.  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 6.7 g 0   benzonatate (TESSALON) 100 MG capsule Take 1 capsule (100 mg total) by mouth every 8 (eight) hours. (Patient not taking: Reported on 03/26/2022) 21 capsule 0   benztropine (COGENTIN) 1 MG tablet Take 1 tablet (1 mg total) by mouth 2 (two) times daily. 180 tablet 0   cetirizine (ZYRTEC ALLERGY) 10 MG tablet Take 1 tablet (10 mg total) by mouth daily. 30 tablet 0   Dextromethorphan-Benzocaine (CEPACOL SORE THROAT & COUGH) 5-7.5 MG LOZG Use as directed 1 tablet in the mouth or throat every 4 (four) hours as needed. (Patient not taking: Reported on 03/26/2022) 36 lozenge 0   fluticasone (FLONASE) 50 MCG/ACT nasal spray Place 2 sprays into both nostrils daily. 9.9 mL 0   hyoscyamine (LEVSIN  SL) 0.125 MG SL tablet Place 1 tablet (0.125 mg total) under the tongue in the morning and at bedtime. 180 tablet 4   levonorgestrel (MIRENA) 20 MCG/24HR IUD 1 Intra Uterine Device (1 each total) by Intrauterine route once. 1 each 0   Melatonin 5 MG TABS Take 1 tablet by mouth daily as needed (sleep).      naproxen (NAPROSYN) 500 MG tablet Take 1 tablet (500 mg total) by mouth 2 (two) times daily. 30 tablet 0   OLANZapine zydis (ZYPREXA) 20 MG disintegrating tablet Take 1 tablet (20 mg total) by mouth at bedtime. 90 tablet 0   polyethylene glycol powder (GLYCOLAX/MIRALAX) 17 GM/SCOOP powder Take 17 g by mouth daily as needed. 850 g 2   No current  facility-administered medications for this visit.    Allergies as of 06/04/2022 - Review Complete 03/26/2022  Allergen Reaction Noted   Lactose intolerance (gi) Diarrhea 05/22/2014    Family History  Problem Relation Age of Onset   Diabetes Maternal Grandfather    Hypertension Maternal Grandfather    Heart disease Maternal Grandfather    Kidney disease Maternal Grandfather    Anesthesia problems Maternal Grandfather        hx. of being hard to wake up post-op   Hypertension Maternal Grandmother    Stroke Maternal Grandmother    Asthma Mother    Hypertension Maternal Uncle    Depression Sister    Cancer Other     Social History   Socioeconomic History   Marital status: Single    Spouse name: Not on file   Number of children: Not on file   Years of education: Not on file   Highest education level: Not on file  Occupational History   Not on file  Tobacco Use   Smoking status: Never   Smokeless tobacco: Never  Vaping Use   Vaping Use: Never used  Substance and Sexual Activity   Alcohol use: No    Alcohol/week: 0.0 standard drinks of alcohol   Drug use: No   Sexual activity: Never    Birth control/protection: I.U.D.  Other Topics Concern   Not on file  Social History Narrative   Not on file   Social Determinants of Health   Financial Resource Strain: Not on file  Food Insecurity: Not on file  Transportation Needs: Not on file  Physical Activity: Not on file  Stress: Not on file  Social Connections: Not on file  Intimate Partner Violence: Not on file    Review of Systems:    Constitutional: No weight loss, fever or chills Cardiovascular: No chest pain Respiratory: No SOB  Gastrointestinal: See HPI and otherwise negative   Physical Exam:  Vital signs: BP 122/78   Pulse 84   Ht '4\' 11"'$  (1.499 m)   Wt 183 lb (83 kg)   SpO2 99%   BMI 36.96 kg/m    Constitutional:   Pleasant AA female appears to be in NAD, Well developed, Well nourished, alert and  cooperative Respiratory: Respirations even and unlabored. Lungs clear to auscultation bilaterally.   No wheezes, crackles, or rhonchi.  Cardiovascular: Normal S1, S2. No MRG. Regular rate and rhythm. No peripheral edema, cyanosis or pallor.  Gastrointestinal:  Soft, nondistended, nontender. No rebound or guarding.  Decreased bowel sounds all 4 quadrants, no appreciable masses or hepatomegaly. Rectal:  Not performed.  Psychiatric: Demonstrates good judgement and reason without abnormal affect or behaviors.  No recent labs or imaging.  Assessment: 1.  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  Prescribed Linzess 145 mcg daily, 30 minutes before breakfast.  #30 with 3 refills.  Also provided the patient with 8 days of samples. 2.  Instructed the patient to check in with me in 1 to 2 weeks, at that time if she needs a different dose we can adjust. 3.  Patient was put in recall for a follow-up appointment in 2 months with either Nevin Bloodgood or myself.  Ellouise Newer, PA-C [facility] 06/04/2022, 1:28 PM  Cc: Alcus Dad, MD

## 2022-06-04 NOTE — Telephone Encounter (Signed)
Patient Advocate Encounter   Received notification from [place] that prior authorization is required for Linzess 145MCG  Submitted: 06/04/2022 Key [id number] Status is pending  Clista Bernhardt, CPhT Rx Patient Advocate Specialist Phone: [phone]

## 2022-06-08 NOTE — Telephone Encounter (Signed)
Patient Advocate Encounter  Prior Authorization for Linzess has been approved.   Effective:  05/06/2022 to  06/05/2023.  Clista Bernhardt, CPhT Rx Patient Advocate Specialist Phone: [phone]

## 2022-06-25 ENCOUNTER — Telehealth (HOSPITAL_BASED_OUTPATIENT_CLINIC_OR_DEPARTMENT_OTHER): Payer: Federal, State, Local not specified - PPO | Admitting: Psychiatry

## 2022-06-25 DIAGNOSIS — F331 Major depressive disorder, recurrent, moderate: Secondary | ICD-10-CM

## 2022-06-25 DIAGNOSIS — F201 Disorganized schizophrenia: Secondary | ICD-10-CM

## 2022-06-25 MED ORDER — OLANZAPINE 20 MG PO TBDP: 20.0000 mg | ORAL_TABLET | Freq: Every day | ORAL | 0 refills | Status: DC

## 2022-06-25 MED ORDER — BENZTROPINE MESYLATE 2 MG PO TABS: 2.0000 mg | ORAL_TABLET | Freq: Two times a day (BID) | ORAL | 0 refills | Status: DC

## 2022-06-25 NOTE — Progress Notes (Signed)
Virtual Visit via Video Note  I connected with [name] on 06/25/22 at 10:30 AM EDT by   a video enabled telemedicine application and verified that I am speaking with the correct person using two identifiers.  Location: Patient: home Provider: office   I discussed the limitations of evaluation and management by telemedicine and the availability of in person appointments. The patient expressed understanding and agreed to proceed.  History of Present Illness: Jessenia shares she is doing well. She is working out at home about 2-3x/week and has lost 7-8 lbs. Her IBS symptoms have improved with Linzess. Jaimarie has been having more anxiety for the last 2 months. She has been having racing thoughts, SOB and palpitations. She will overthink things on a daily basis. Zuleica has been using coping skills and it helps. She is sleeping well and gets about 8-9 hrs/night. Her energy and appetite are good. Her depression improved since she her IBS improved. She feels depressed daily and the average level is 6/10 (10 being the worst). At time she doesn't want to go out  because she worries about falls but denies anhedonia. Jenavieve enjoys going out with her family.   She denies paranoia, ideas of reference and AVH. She is no longer getting signals about other people's suffering.she denies SI/HI. Her diet has improved and she is happy about her food choices. Her focus remains poor. Claudell still has on/off feelings of worthlessness. She continues to have random jerks in her limbs. The Cogentin helps.    Observations/Objective: Psychiatric Specialty Exam: ROS  There were no vitals taken for this visit.There is no height or weight on file to calculate BMI.  General Appearance: Casual and Fairly Groomed  Eye Contact:  Good  Speech:  Clear and Coherent and Normal Rate  Volume:  Normal  Mood:  Euthymic  Affect:  Constricted  Thought Process:  slow, concrete, Coherent and Descriptions of Associations:  Intact  Orientation:  Full (Time, Place, and Person)  Thought Content:  Paranoid Ideation  Suicidal Thoughts:  No  Homicidal Thoughts:  No  Memory:  Immediate;   Good  Judgement:  Good  Insight:  Good  Psychomotor Activity:  Normal  Concentration:  Concentration: Good  Recall:  Good  Fund of Knowledge:  Good  Language:  Good  Akathisia:  No  Handed:  Right  AIMS (if indicated):     Assets:  Communication Skills Desire for Improvement Financial Resources/Insurance Housing Resilience Social Support Talents/Skills Transportation Vocational/Educational  ADL's:  Intact  Cognition:  WNL  Sleep:        Assessment and Plan:     06/25/2022   10:38 AM 12/25/2021   10:39 AM 11/25/2021   10:23 AM 11/11/2021    8:30 AM 09/25/2021    9:30 AM  Depression screen PHQ 2/9  Decreased Interest 0 0 1 1 0  Down, Depressed, Hopeless '3 2 1 '$ 0 1  PHQ - 2 Score '3 2 2 1 1  '$ Altered sleeping 0 0 1 2 0  Tired, decreased energy 0 0 2 1 0  Change in appetite 0 0 1 3 0  Feeling bad or failure about yourself  1 1 0 1 0  Trouble concentrating 2 0 2 2 0  Moving slowly or fidgety/restless 0 0 2 2 0  Suicidal thoughts 0 0 0 2 0  PHQ-9 Score '6 3 10 14 1  '$ Difficult doing work/chores Somewhat difficult Not difficult at all  Somewhat difficult Not difficult at all  Flowsheet Row Video Visit from 06/25/2022 in [facility] Video Visit from 12/25/2021 in [facility] from 11/22/2021 in [place] No Risk No Risk No Risk         Pt is aware that these meds carry a teratogenic risk. Pt will discuss plan of action if she does or plans to become pregnant in the future.  Status of current problems: worsening anxiety  Meds: increase Cogentin '2mg'$  po BID for EPS Leoni declined option of starting an SSRI for anxiety symptoms.  1. 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  Dispense: 180 tablet; Refill: 0 - OLANZapine zydis (ZYPREXA) 20 MG disintegrating tablet; Take 1 tablet (20 mg total) by mouth at bedtime.  Dispense: 90 tablet; Refill: 0  2. MDD (major depressive disorder), recurrent episode, moderate (HCC) - OLANZapine zydis (ZYPREXA) 20 MG disintegrating tablet; Take 1 tablet (20 mg total) by mouth at bedtime.  Dispense: 90 tablet; Refill: 0     Labs: she has not gotten labs or EKG yet. She plans to go soon.     Therapy: brief supportive therapy provided. Discussed psychosocial stressors in detail.     Collaboration of Care: Other none  Patient/Guardian was advised Release of Information must be obtained prior to any record release in order to collaborate their care with an outside provider. Patient/Guardian was advised if they have not already done so to contact the registration department to sign all necessary forms in order for [place] to release information regarding their care.   Consent: Patient/Guardian gives verbal consent for treatment and assignment of benefits for services provided during this visit. Patient/Guardian expressed understanding and agreed to proceed.     Follow Up Instructions: Follow up in 2-3 months or sooner if needed    I discussed the assessment and treatment plan with the patient. The patient was provided an opportunity to ask questions and all were answered. The patient agreed with the plan and demonstrated an understanding of the instructions.   The patient was advised to call back or seek an in-person evaluation if the symptoms worsen or if the condition fails to improve as anticipated.  I provided 17 minutes of non-face-to-face time during this encounter.   [name], MD

## 2022-07-02 ENCOUNTER — Ambulatory Visit (HOSPITAL_COMMUNITY): Payer: Federal, State, Local not specified - PPO | Admitting: Licensed Clinical Social Worker

## 2022-07-16 ENCOUNTER — Ambulatory Visit (HOSPITAL_COMMUNITY): Payer: Federal, State, Local not specified - PPO | Admitting: Licensed Clinical Social Worker

## 2022-07-30 ENCOUNTER — Ambulatory Visit (INDEPENDENT_AMBULATORY_CARE_PROVIDER_SITE_OTHER): Payer: Federal, State, Local not specified - PPO | Admitting: Licensed Clinical Social Worker

## 2022-07-30 ENCOUNTER — Encounter (HOSPITAL_COMMUNITY): Payer: Self-pay | Admitting: Licensed Clinical Social Worker

## 2022-07-30 DIAGNOSIS — F201 Disorganized schizophrenia: Secondary | ICD-10-CM | POA: Diagnosis not present

## 2022-07-30 NOTE — Progress Notes (Signed)
Virtual Visit via Video Note  I connected with [name] on 07/30/22 at 12:30 PM EDT by a video enabled telemedicine application and verified that I am speaking with the correct person using two identifiers.  Location: Patient: home Provider: home office   I discussed the limitations of evaluation and management by telemedicine and the availability of in person appointments. The patient expressed understanding and agreed to proceed.   I discussed the assessment and treatment plan with the patient. The patient was provided an opportunity to ask questions and all were answered. The patient agreed with the plan and demonstrated an understanding of the instructions.   The patient was advised to call back or seek an in-person evaluation if the symptoms worsen or if the condition fails to improve as anticipated.  I provided 45 minutes of non-face-to-face time during this encounter.   [name], LCSW   THERAPIST PROGRESS NOTE  Session Time: 12:30pm-1:15pm  Participation Level: Active  Behavioral Response: NeatAlertAnxious  Type of Therapy: Individual Therapy  Treatment Goals addressed: "I want to work on my anxiety, depression, self-confidence, and my ability to socialize". Symphani will report reduced depression and anxiety 5 out of 7 days.   ProgressTowards Goals: Progressing  Interventions: CBT  Summary: AVERILL WINTERS is a 26 y.o. female who presents with Schizophrenia, disorganized type.   Suicidal/Homicidal: Nowithout intent/plan  Therapist Response: Ranesha engaged well in individual session with clinician. Clinician utilized CBT to process updates in thoughts, feelings, and behaviors. Jaleena shared overall improvement in mood and activities. However, she reports ongoing anxiety. Clinician reviewed use of coping skills and provided alternative options for de-escalation of anxiety sxs. Clinician rehearsed options for counting breaths, spelling different  words, saying alphabet backwards. Clinician also explored use of EFT tapping and noted benefits when these activities are completed. Clinician encouraged Yakelin to write down different coping methods to help remind her when she needs to use them.   Plan: Return again in 3 weeks.  Diagnosis: Schizophrenia, disorganized type ([place])  Collaboration of Care: Psychiatrist AEB reviewed note from Dr. [name]  Patient/Guardian was advised Release of Information must be obtained prior to any record release in order to collaborate their care with an outside provider. Patient/Guardian was advised if they have not already done so to contact the registration department to sign all necessary forms in order for [place] to release information regarding their care.   Consent: Patient/Guardian gives verbal consent for treatment and assignment of benefits for services provided during this visit. Patient/Guardian expressed understanding and agreed to proceed.   Jobe Marker [place], LCSW 07/30/2022

## 2022-08-06 ENCOUNTER — Ambulatory Visit (HOSPITAL_COMMUNITY): Payer: Federal, State, Local not specified - PPO | Admitting: Licensed Clinical Social Worker

## 2022-08-20 ENCOUNTER — Ambulatory Visit (INDEPENDENT_AMBULATORY_CARE_PROVIDER_SITE_OTHER): Payer: Federal, State, Local not specified - PPO | Admitting: Licensed Clinical Social Worker

## 2022-08-20 ENCOUNTER — Encounter (HOSPITAL_COMMUNITY): Payer: Self-pay | Admitting: Licensed Clinical Social Worker

## 2022-08-20 DIAGNOSIS — F201 Disorganized schizophrenia: Secondary | ICD-10-CM

## 2022-08-20 NOTE — Progress Notes (Signed)
Virtual Visit via Video Note  I connected with Natalie Wagner on 08/20/22 at 11:00 AM EDT by a video enabled telemedicine application and verified that I am speaking with the correct person using two identifiers.  Location: Patient: home Provider: home office   I discussed the limitations of evaluation and management by telemedicine and the availability of in person appointments. The patient expressed understanding and agreed to proceed.   I discussed the assessment and treatment plan with the patient. The patient was provided an opportunity to ask questions and all were answered. The patient agreed with the plan and demonstrated an understanding of the instructions.   The patient was advised to call back or seek an in-person evaluation if the symptoms worsen or if the condition fails to improve as anticipated.  I provided 45 minutes of non-face-to-face time during this encounter.   Natalie Curling, LCSW   THERAPIST PROGRESS NOTE  Session Time: 11:00am-11:45am  Participation Level: Active  Behavioral Response: CasualAlertEuthymic  Type of Therapy: Individual Therapy  Treatment Goals addressed: I want to work on my anxiety, depression, self-confidence, and my ability to socialize". Natalie Wagner will report reduced depression and anxiety 5 out of 7 days.   ProgressTowards Goals: Progressing  Interventions: CBT  Summary: Natalie Wagner is a 26 y.o. female who presents with Schizophrenia, disorganized type.   Suicidal/Homicidal: Nowithout intent/plan  Therapist Response: Natalie Wagner engaged well in individual session with clinciian. Clinician utilized CBT to process thoughts, feelings, and behaviors. Clinician explroed updates on contact with [facility] [employer]. Clinician provided feedback about reputation and knowledge of the agency and processed Natalie Wagner's vision problems. Clinician discussed experiences in school being small and wearing glasses. Clinician explored hx  of being bullied and discussed ways that Natalie Wagner was able to remain kind and caring. Clinician reviewed coping skills for anxiety and processed the importance of doing some more thoughtful and challenging tasks, in order to distract her brain from anxious thoughts.   Plan: Return again in 4 weeks.  Diagnosis: Schizophrenia, disorganized type (Natalie Wagner)  Collaboration of Care: Psychiatrist Natalie Wagner reviewed recent note from Dr. Havery Wagner last visit  Patient/Guardian was advised Release of Information must be obtained prior to any record release in order to collaborate their care with an outside provider. Patient/Guardian was advised if they have not already done so to contact the registration department to sign all necessary forms in order for [place] to release information regarding their care.   Consent: Patient/Guardian gives verbal consent for treatment and assignment of benefits for services provided during this visit. Patient/Guardian expressed understanding and agreed to proceed.   Natalie Marker [place], LCSW 08/20/2022

## 2022-09-01 ENCOUNTER — Encounter: Payer: Self-pay | Admitting: Family Medicine

## 2022-09-04 ENCOUNTER — Ambulatory Visit: Payer: Federal, State, Local not specified - PPO | Admitting: Student

## 2022-09-17 ENCOUNTER — Encounter (HOSPITAL_COMMUNITY): Payer: Self-pay | Admitting: Psychiatry

## 2022-09-17 ENCOUNTER — Telehealth (HOSPITAL_BASED_OUTPATIENT_CLINIC_OR_DEPARTMENT_OTHER): Payer: Federal, State, Local not specified - PPO | Admitting: Psychiatry

## 2022-09-17 DIAGNOSIS — Z79899 Other long term (current) drug therapy: Secondary | ICD-10-CM | POA: Diagnosis not present

## 2022-09-17 DIAGNOSIS — F331 Major depressive disorder, recurrent, moderate: Secondary | ICD-10-CM | POA: Diagnosis not present

## 2022-09-17 DIAGNOSIS — F201 Disorganized schizophrenia: Secondary | ICD-10-CM | POA: Diagnosis not present

## 2022-09-17 MED ORDER — BENZTROPINE MESYLATE 2 MG PO TABS: 2.0000 mg | ORAL_TABLET | Freq: Two times a day (BID) | ORAL | 0 refills | Status: DC

## 2022-09-17 MED ORDER — OLANZAPINE 20 MG PO TBDP: 20.0000 mg | ORAL_TABLET | Freq: Every day | ORAL | 0 refills | Status: DC

## 2022-09-17 NOTE — Progress Notes (Signed)
Virtual Visit via Video Note  I connected with Natalie Wagner on 09/17/22 at 11:30 AM EST by a video enabled telemedicine application and verified that I am speaking with the correct person using two identifiers.  Location: Patient: home Provider: office   I discussed the limitations of evaluation and management by telemedicine and the availability of in person appointments. The patient expressed understanding and agreed to proceed.  History of Present Illness: Natalie Wagner is doing well. She is looking forward to the holidays. Lately has been cooking more often and is doing chores around the house. Her sleep and appetite are good. She spends time with family, watching tv or playing games. About 2 days a weeks she feels depressed. On those days sad and less energetic. She will lie around the house and feels a sense of worthlessness. She denies SI/HI. As long as she is busy she doesn't have any AVH. She has rare AH of 1 voice that she doesn't recognize. It is usually derogatory towards her. Her paranoia is ongoing and she experiences it delay. It fees like she could be hurt at home even at home. Distraction usually helps. She has an appointment with her PCP next week and will get labs done.    Observations/Objective: Psychiatric Specialty Exam: ROS  There were no vitals taken for this visit.There is no height or weight on file to calculate BMI.  General Appearance: Casual, Fairly Groomed, and Neat  Eye Contact:  Good  Speech:  Clear and Coherent and Slow  Volume:  Normal  Mood:  Euthymic  Affect:  Blunt  Thought Process: concrete,  Coherent and Descriptions of Associations: Intact  Orientation:  Full (Time, Place, and Person)  Thought Content:  Paranoid Ideation  Suicidal Thoughts:  No  Homicidal Thoughts:  No  Memory:  Immediate;   Good  Judgement:  Good  Insight:  Fair  Psychomotor Activity:  Normal  Concentration:  Concentration: Fair  Recall:  Good  Fund of Knowledge:  Good   Language:  Good  Akathisia:  No  Handed:  Right  AIMS (if indicated):     Assets:  Communication Skills Desire for Improvement Financial Resources/Insurance Housing Leisure Time Resilience Social Support Talents/Skills Transportation Vocational/Educational  ADL's:  Intact  Cognition:  WNL  Sleep:        Assessment and Plan:     09/17/2022   11:48 AM 06/25/2022   10:38 AM 12/25/2021   10:39 AM 11/25/2021   10:23 AM 11/11/2021    8:30 AM  Depression screen PHQ 2/9  Decreased Interest 0 0 0 1 1  Down, Depressed, Hopeless '1 3 2 1 '$ 0  PHQ - 2 Score '1 3 2 2 1  '$ Altered sleeping  0 0 1 2  Tired, decreased energy  0 0 2 1  Change in appetite  0 0 1 3  Feeling bad or failure about yourself   1 1 0 1  Trouble concentrating  2 0 2 2  Moving slowly or fidgety/restless  0 0 2 2  Suicidal thoughts  0 0 0 2  PHQ-9 Score  '6 3 10 14  '$ Difficult doing work/chores  Somewhat difficult Not difficult at all  Somewhat difficult    Flowsheet Row Video Visit from 09/17/2022 in [facility] Video Visit from 06/25/2022 in [facility] Video Visit from 12/25/2021 in [place] No Risk No Risk No Risk  Pt is aware that these meds carry a teratogenic risk. Pt will discuss plan of action if she does or plans to become pregnant in the future.  Status of current problems: ongoing paranoia and some depression  Meds:  1. Schizophrenia, disorganized type ([place]) - benztropine (COGENTIN) 2 MG tablet; Take 1 tablet (2 mg total) by mouth 2 (two) times daily.  Dispense: 180 tablet; Refill: 0 - OLANZapine zydis (ZYPREXA) 20 MG disintegrating tablet; Take 1 tablet (20 mg total) by mouth at bedtime.  Dispense: 90 tablet; Refill: 0  2. MDD (major depressive disorder), recurrent episode, moderate ([facility]) - OLANZapine zydis (ZYPREXA) 20 MG disintegrating tablet; Take 1  tablet (20 mg total) by mouth at bedtime.  Dispense: 90 tablet; Refill: 0  3. Encounter for long-term current use of high risk medication - EKG; Standing - CBC with Differential - Comprehensive Metabolic Panel (CMET) - HgB A1c - TSH - Lipid Profile - Prolactin     Labs: labs to be drawn next week at PCP office. I asked that she get an EKG    Therapy: brief supportive therapy provided. Discussed psychosocial stressors in detail.     Collaboration of Care: Other none  Patient/Guardian was advised Release of Information must be obtained prior to any record release in order to collaborate their care with an outside provider. Patient/Guardian was advised if they have not already done so to contact the registration department to sign all necessary forms in order for [place] to release information regarding their care.   Consent: Patient/Guardian gives verbal consent for treatment and assignment of benefits for services provided during this visit. Patient/Guardian expressed understanding and agreed to proceed.       Follow Up Instructions: Follow up in 3 months or sooner if needed    I discussed the assessment and treatment plan with the patient. The patient was provided an opportunity to ask questions and all were answered. The patient agreed with the plan and demonstrated an understanding of the instructions.   The patient was advised to call back or seek an in-person evaluation if the symptoms worsen or if the condition fails to improve as anticipated.  I provided 14 minutes of non-face-to-face time during this encounter.   [name], MD

## 2022-09-23 ENCOUNTER — Encounter: Payer: Self-pay | Admitting: Family Medicine

## 2022-09-23 ENCOUNTER — Ambulatory Visit (HOSPITAL_COMMUNITY)
Admission: RE | Admit: 2022-09-23 | Discharge: 2022-09-23 | Disposition: A | Discharge status: Home / Self Care | Payer: Federal, State, Local not specified - PPO | Source: Ambulatory Visit | Attending: Family Medicine | Admitting: Family Medicine

## 2022-09-23 ENCOUNTER — Other Ambulatory Visit (HOSPITAL_COMMUNITY): Payer: Federal, State, Local not specified - PPO

## 2022-09-23 ENCOUNTER — Ambulatory Visit (INDEPENDENT_AMBULATORY_CARE_PROVIDER_SITE_OTHER): Payer: Federal, State, Local not specified - PPO | Admitting: Family Medicine

## 2022-09-23 VITALS — BP 110/68 | HR 100 | Ht 59.0 in | Wt 189.4 lb

## 2022-09-23 DIAGNOSIS — Z79899 Other long term (current) drug therapy: Secondary | ICD-10-CM | POA: Diagnosis not present

## 2022-09-23 DIAGNOSIS — R9431 Abnormal electrocardiogram [ECG] [EKG]: Secondary | ICD-10-CM | POA: Insufficient documentation

## 2022-09-23 DIAGNOSIS — Z975 Presence of (intrauterine) contraceptive device: Secondary | ICD-10-CM

## 2022-09-23 DIAGNOSIS — Z23 Encounter for immunization: Secondary | ICD-10-CM

## 2022-09-24 ENCOUNTER — Ambulatory Visit (HOSPITAL_COMMUNITY): Payer: Federal, State, Local not specified - PPO | Admitting: Licensed Clinical Social Worker

## 2022-09-24 ENCOUNTER — Encounter: Payer: Self-pay | Admitting: Family Medicine

## 2022-09-24 NOTE — Progress Notes (Signed)
    CHIEF COMPLAINT / HPI: Patient wants her IUD strings checked.  She has never checked them herself.  She had IUD placed 2017.  No problems with it. 2.  Sees psychiatry for medication management.  They have requested that she get an EKG at her PCP office and also some labs.  Unclear what labs they want. #3.  For health maintenance she would like to get her flu shot today.   PERTINENT  PMH / PSH: I have reviewed the patient's medications, allergies, past medical and surgical history, smoking status and updated in the EMR as appropriate.   OBJECTIVE:  BP 110/68   Pulse 100   Ht '4\' 11"'$  (1.499 m)   Wt 189 lb 6.4 oz (85.9 kg)   SpO2 99%   BMI 38.25 kg/m  GENERAL: Well-developed female no acute distress GU: Externally normal.  Speculum exam reveals normal-appearing cervix with IUD strings in place at the os. Cardiovascular: Regular rate and rhythm.  EKG: Normal sinus rhythm rate of 86.   QTc is 435.  No abnormal ST-T elevation or changes.  No Q waves noted.  ASSESSMENT / PLAN: #1.  IUD strings in place and she is reassured.  I did tell her she could check them herself and we discussed how to do that. 2.  EKG done.  QTc is normal.  I looked in her available notes from psychiatry and they made no mention of labs.  She will have to get back with them about that.  I will route the EKG report to her psychiatrist.  If psychiatry wants family medicine to do her labs, they need to arrange this directly with her PCP who is Dr. Herma Ard. #3.  Health maintenance: Flu shot given.  No problem-specific Assessment & Plan notes found for this encounter.   Dorcas Mcmurray MD

## 2022-09-29 DIAGNOSIS — Z79899 Other long term (current) drug therapy: Secondary | ICD-10-CM | POA: Diagnosis not present

## 2022-09-30 LAB — LIPID PANEL
Chol/HDL Ratio: 6 ratio — ABNORMAL HIGH (ref 0.0–4.4)
Cholesterol, Total: 203 mg/dL — ABNORMAL HIGH (ref 100–199)
HDL: 34 mg/dL — ABNORMAL LOW (ref >39)
LDL Chol Calc (NIH): 156 mg/dL — ABNORMAL HIGH (ref 0–99)
Triglycerides: 69 mg/dL (ref 0–149)
VLDL Cholesterol Cal: 13 mg/dL (ref 5–40)

## 2022-09-30 LAB — COMPREHENSIVE METABOLIC PANEL
ALT: 19 IU/L (ref 0–32)
AST: 17 IU/L (ref 0–40)
Albumin/Globulin Ratio: 1.6 (ref 1.2–2.2)
Albumin: 4.3 g/dL (ref 4.0–5.0)
Alkaline Phosphatase: 88 IU/L (ref 44–121)
BUN/Creatinine Ratio: 10 (ref 9–23)
BUN: 9 mg/dL (ref 6–20)
Bilirubin Total: 0.3 mg/dL (ref 0.0–1.2)
CO2: 21 mmol/L (ref 20–29)
Calcium: 9.2 mg/dL (ref 8.7–10.2)
Chloride: 103 mmol/L (ref 96–106)
Creatinine, Ser: 0.88 mg/dL (ref 0.57–1.00)
Globulin, Total: 2.7 g/dL (ref 1.5–4.5)
Glucose: 93 mg/dL (ref 70–99)
Potassium: 4.5 mmol/L (ref 3.5–5.2)
Sodium: 141 mmol/L (ref 134–144)
Total Protein: 7 g/dL (ref 6.0–8.5)
eGFR: 93 mL/min/{1.73_m2} (ref >59)

## 2022-09-30 LAB — CBC WITH DIFFERENTIAL/PLATELET
Basophils Absolute: 0 10*3/uL (ref 0.0–0.2)
Basos: 0 %
EOS (ABSOLUTE): 0.2 10*3/uL (ref 0.0–0.4)
Eos: 2 %
Hematocrit: 36.9 % (ref 34.0–46.6)
Hemoglobin: 11.5 g/dL (ref 11.1–15.9)
Immature Grans (Abs): 0.1 10*3/uL (ref 0.0–0.1)
Immature Granulocytes: 1 %
Lymphocytes Absolute: 2.7 10*3/uL (ref 0.7–3.1)
Lymphs: 31 %
MCH: 28.7 pg (ref 26.6–33.0)
MCHC: 31.2 g/dL — ABNORMAL LOW (ref 31.5–35.7)
MCV: 92 fL (ref 79–97)
Monocytes Absolute: 0.6 10*3/uL (ref 0.1–0.9)
Monocytes: 7 %
Neutrophils Absolute: 5.1 10*3/uL (ref 1.4–7.0)
Neutrophils: 59 %
Platelets: 382 10*3/uL (ref 150–450)
RBC: 4.01 x10E6/uL (ref 3.77–5.28)
RDW: 11.8 % (ref 11.7–15.4)
WBC: 8.7 10*3/uL (ref 3.4–10.8)

## 2022-09-30 LAB — TSH: TSH: 3.04 u[IU]/mL (ref 0.450–4.500)

## 2022-09-30 LAB — HEMOGLOBIN A1C
Est. average glucose Bld gHb Est-mCnc: 111 mg/dL
Hgb A1c MFr Bld: 5.5 % (ref 4.8–5.6)

## 2022-09-30 LAB — PROLACTIN: Prolactin: 60.3 ng/mL — ABNORMAL HIGH (ref 4.8–23.3)

## 2022-10-15 ENCOUNTER — Ambulatory Visit (HOSPITAL_COMMUNITY): Payer: Federal, State, Local not specified - PPO | Admitting: Licensed Clinical Social Worker

## 2022-10-19 ENCOUNTER — Ambulatory Visit: Payer: Federal, State, Local not specified - PPO | Admitting: Family Medicine

## 2022-10-19 NOTE — Progress Notes (Deleted)
    SUBJECTIVE:   CHIEF COMPLAINT / HPI:   Discuss IUD Removal Mirena placed in 2017.   PERTINENT  PMH / PSH: schizophrenia  OBJECTIVE:   There were no vitals taken for this visit.  ***  ASSESSMENT/PLAN:   No problem-specific Assessment & Plan notes found for this encounter.     Alcus Dad, MD [place]

## 2022-11-12 ENCOUNTER — Ambulatory Visit (HOSPITAL_COMMUNITY): Payer: Federal, State, Local not specified - PPO | Admitting: Licensed Clinical Social Worker

## 2022-11-17 DIAGNOSIS — H43393 Other vitreous opacities, bilateral: Secondary | ICD-10-CM | POA: Diagnosis not present

## 2022-11-17 DIAGNOSIS — H35419 Lattice degeneration of retina, unspecified eye: Secondary | ICD-10-CM | POA: Diagnosis not present

## 2022-12-24 ENCOUNTER — Ambulatory Visit (HOSPITAL_COMMUNITY): Payer: Federal, State, Local not specified - PPO | Admitting: Licensed Clinical Social Worker

## 2022-12-25 DIAGNOSIS — H35413 Lattice degeneration of retina, bilateral: Secondary | ICD-10-CM | POA: Diagnosis not present

## 2023-01-07 ENCOUNTER — Telehealth (HOSPITAL_BASED_OUTPATIENT_CLINIC_OR_DEPARTMENT_OTHER): Payer: Federal, State, Local not specified - PPO | Admitting: Psychiatry

## 2023-01-07 DIAGNOSIS — F331 Major depressive disorder, recurrent, moderate: Secondary | ICD-10-CM

## 2023-01-07 DIAGNOSIS — F201 Disorganized schizophrenia: Secondary | ICD-10-CM

## 2023-01-07 MED ORDER — OLANZAPINE 20 MG PO TBDP: 20.0000 mg | ORAL_TABLET | Freq: Every day | ORAL | 0 refills | Status: DC

## 2023-01-07 MED ORDER — BENZTROPINE MESYLATE 2 MG PO TABS: 2.0000 mg | ORAL_TABLET | Freq: Two times a day (BID) | ORAL | 0 refills | Status: DC

## 2023-01-07 NOTE — Progress Notes (Signed)
Virtual Visit via Video Note  I connected with [name] on 01/07/23 at 11:00 AM EST by  a video enabled telemedicine application and verified that I am speaking with the correct person using two identifiers.  Location: Patient: home Provider: office   I discussed the limitations of evaluation and management by telemedicine and the availability of in person appointments. The patient expressed understanding and agreed to proceed.  History of Present Illness: Jazale is doing well. She has been focusing on health and making better decisions about food and self care. Her anxiety and depression are unchanged. A lot of her depression and anxiety are increased due to stress. March 5th she is scheduled for retinal surgery. This is very anxiety provoking because she has never had eye surgery. She gets depressed for a few hours each day. Distraction and socializing are very effective in managing the depression symptoms. She has anxiety and paranoia when she goes out. Her sleep is good. Her appetite and energy are good. Her focus is poor a lot of times and she struggles with boredom. At times she struggles with negative self talk. She denies SI/HI. Shaquise denies AVH but has flashes of floaters due to vision problems. Aailyah is taking her meds as prescribed and denies SE. She denies symptoms of gynecomastia including increased breast or nipple size, breast pain and tenderness, discharge from breasts. She has an IUD and it causes continuous spotting.    Observations/Objective: Psychiatric Specialty Exam: ROS  There were no vitals taken for this visit.There is no height or weight on file to calculate BMI.  General Appearance: Casual and Fairly Groomed  Eye Contact:  Good  Speech:  Clear and Coherent and Normal Rate  Volume:  Normal  Mood:  Euthymic  Affect:  Full Range  Thought Process:  Coherent and Descriptions of Associations: Circumstantial  Orientation:  Full (Time, Place, and Person)   Thought Content:  Logical  Suicidal Thoughts:  No  Homicidal Thoughts:  No  Memory:  Immediate;   Good  Judgement:  Good  Insight:  Good  Psychomotor Activity:  Normal  Concentration:  Concentration: Good  Recall:  Good  Fund of Knowledge:  Good  Language:  Good  Akathisia:  No  Handed:  Right  AIMS (if indicated):     Assets:  Communication Skills Desire for Improvement Financial Resources/Insurance Housing Leisure Time Resilience Social Support Talents/Skills Transportation Vocational/Educational  ADL's:  Intact  Cognition:  WNL  Sleep:        Assessment and Plan:     01/07/2023   11:16 AM 09/23/2022   11:08 AM 09/17/2022   11:48 AM 06/25/2022   10:38 AM 12/25/2021   10:39 AM  Depression screen PHQ 2/9  Decreased Interest 0 1 0 0 0  Down, Depressed, Hopeless '3 1 1 3 2  '$ PHQ - 2 Score '3 2 1 3 2  '$ Altered sleeping 0 3  0 0  Tired, decreased energy 0 1  0 0  Change in appetite 0 3  0 0  Feeling bad or failure about yourself  1 0  1 1  Trouble concentrating 2 0  2 0  Moving slowly or fidgety/restless 0 1  0 0  Suicidal thoughts 0 0  0 0  PHQ-9 Score '6 10  6 3  '$ Difficult doing work/chores Somewhat difficult Somewhat difficult  Somewhat difficult Not difficult at all    Flowsheet Row Video Visit from 01/07/2023 in [facility] Video Visit from  09/17/2022 in [facility] Video Visit from 06/25/2022 in [place] No Risk No Risk No Risk          Pt is aware that these meds carry a teratogenic risk. Pt will discuss plan of action if she does or plans to become pregnant in the future.  Status of current problems: overall stable    Medication management with supportive therapy. Risks and benefits, side effects and alternative treatment options discussed with patient. Pt was given an opportunity to ask questions about medication,  illness, and treatment. All current psychiatric medications have been reviewed and discussed with the patient and adjusted as clinically appropriate.  Pt verbalized understanding and verbal consent obtained for treatment.  Meds:  1. Schizophrenia, disorganized type ([place]) - benztropine (COGENTIN) 2 MG tablet; Take 1 tablet (2 mg total) by mouth 2 (two) times daily.  Dispense: 180 tablet; Refill: 0 - OLANZapine zydis (ZYPREXA) 20 MG disintegrating tablet; Take 1 tablet (20 mg total) by mouth at bedtime.  Dispense: 90 tablet; Refill: 0  2. MDD (major depressive disorder), recurrent episode, moderate ([facility]) - OLANZapine zydis (ZYPREXA) 20 MG disintegrating tablet; Take 1 tablet (20 mg total) by mouth at bedtime.  Dispense: 90 tablet; Refill: 0     Labs: 09/29/22 CMP WNL, Chol 203, CBC  WNL, Prolactin elevated 60.3, HbA1c WNL, TSH WNL 09/23/22 EKG Qtc 435  Therapy: brief supportive therapy provided. Discussed psychosocial stressors in detail.     Collaboration of Care: Other none Other none  Patient/Guardian was advised Release of Information must be obtained prior to any record release in order to collaborate their care with an outside provider. Patient/Guardian was advised if they have not already done so to contact the registration department to sign all necessary forms in order for [place] to release information regarding their care.   Consent: Patient/Guardian gives verbal consent for treatment and assignment of benefits for services provided during this visit. Patient/Guardian expressed understanding and agreed to proceed.    Follow Up Instructions: Follow up in 2-3 months or sooner if needed    I discussed the assessment and treatment plan with the patient. The patient was provided an opportunity to ask questions and all were answered. The patient agreed with the plan and demonstrated an understanding of the instructions.   The patient was advised to call back or seek an in-person  evaluation if the symptoms worsen or if the condition fails to improve as anticipated.  I provided 16 minutes of non-face-to-face time during this encounter.   [name], MD

## 2023-01-12 DIAGNOSIS — H33323 Round hole, bilateral: Secondary | ICD-10-CM | POA: Diagnosis not present

## 2023-01-21 ENCOUNTER — Encounter (HOSPITAL_COMMUNITY): Payer: Self-pay | Admitting: Licensed Clinical Social Worker

## 2023-01-21 ENCOUNTER — Ambulatory Visit (INDEPENDENT_AMBULATORY_CARE_PROVIDER_SITE_OTHER): Payer: Federal, State, Local not specified - PPO | Admitting: Licensed Clinical Social Worker

## 2023-01-21 DIAGNOSIS — F201 Disorganized schizophrenia: Secondary | ICD-10-CM | POA: Diagnosis not present

## 2023-01-21 NOTE — Progress Notes (Signed)
Virtual Visit via Video Note  I connected with [name] on 01/21/23 at 10:00 AM EDT by a video enabled telemedicine application and verified that I am speaking with the correct person using two identifiers.  Location: Patient: home Provider: home office   I discussed the limitations of evaluation and management by telemedicine and the availability of in person appointments. The patient expressed understanding and agreed to proceed.   I discussed the assessment and treatment plan with the patient. The patient was provided an opportunity to ask questions and all were answered. The patient agreed with the plan and demonstrated an understanding of the instructions.   The patient was advised to call back or seek an in-person evaluation if the symptoms worsen or if the condition fails to improve as anticipated.  I provided 45 minutes of non-face-to-face time during this encounter.   [name], LCSW   THERAPIST PROGRESS NOTE  Session Time: 10:00am-10:45am  Participation Level: Active  Behavioral Response: NeatAlertEuthymic  Type of Therapy: Individual Therapy  Treatment Goals addressed: I want to work on my anxiety, depression, self-confidence, and my ability to socialize". Jelani will report reduced depression and anxiety 5 out of 7 days.    ProgressTowards Goals: Progressing  Interventions: CBT  Summary: NORIAH STJULIAN is a 27 y.o. female who presents with Schizophrenia, disorganized type.   Suicidal/Homicidal: Nowithout intent/plan  Therapist Response: Anah engaged well in session. Clinician utilized CBT to process thoughts, feelings and behaviors. Clinician explored updates in the family and noted that a baby niece was born early this morning. Clinician discussed anxiety sxs and depression. Anqi identified ongoing anxiety issues, particularly related to her stomach and worry that her stomach will hurt after eating. Clinician discussed ways to find  joy in foods that are healthier for her. Clinician also discussed other options to keep her anxiety low, such as gardening, walking, and singing/listening to music.   Plan: Return again in 4 weeks.  Diagnosis: Schizophrenia, disorganized type ([place])  Collaboration of Care: Patient refused AEB none required  Patient/Guardian was advised Release of Information must be obtained prior to any record release in order to collaborate their care with an outside provider. Patient/Guardian was advised if they have not already done so to contact the registration department to sign all necessary forms in order for [place] to release information regarding their care.   Consent: Patient/Guardian gives verbal consent for treatment and assignment of benefits for services provided during this visit. Patient/Guardian expressed understanding and agreed to proceed.   Jobe Marker [place], LCSW 01/21/2023

## 2023-02-10 DIAGNOSIS — H33323 Round hole, bilateral: Secondary | ICD-10-CM | POA: Diagnosis not present

## 2023-02-18 ENCOUNTER — Ambulatory Visit (HOSPITAL_COMMUNITY): Payer: Medicaid Other | Admitting: Licensed Clinical Social Worker

## 2023-03-02 DIAGNOSIS — K080 Exfoliation of teeth due to systemic causes: Secondary | ICD-10-CM | POA: Diagnosis not present

## 2023-03-11 ENCOUNTER — Telehealth (HOSPITAL_BASED_OUTPATIENT_CLINIC_OR_DEPARTMENT_OTHER): Payer: Federal, State, Local not specified - PPO | Admitting: Psychiatry

## 2023-03-11 DIAGNOSIS — F331 Major depressive disorder, recurrent, moderate: Secondary | ICD-10-CM | POA: Diagnosis not present

## 2023-03-11 DIAGNOSIS — K581 Irritable bowel syndrome with constipation: Secondary | ICD-10-CM | POA: Diagnosis not present

## 2023-03-11 DIAGNOSIS — F201 Disorganized schizophrenia: Secondary | ICD-10-CM | POA: Diagnosis not present

## 2023-03-11 MED ORDER — OLANZAPINE 20 MG PO TBDP: 20.0000 mg | ORAL_TABLET | Freq: Every day | ORAL | 0 refills | Status: DC

## 2023-03-11 MED ORDER — POLYETHYLENE GLYCOL 3350 17 GM/SCOOP PO POWD: 17.0000 g | Freq: Every day | ORAL | 2 refills | Status: AC | PRN

## 2023-03-11 MED ORDER — BENZTROPINE MESYLATE 2 MG PO TABS: 2.0000 mg | ORAL_TABLET | Freq: Two times a day (BID) | ORAL | 0 refills | Status: DC

## 2023-03-11 NOTE — Progress Notes (Signed)
Virtual Visit via Video Note  I connected with Natalie Wagner on 03/11/23 at 11:30 AM EDT by a video enabled telemedicine application and verified that I am speaking with the correct person using two identifiers.  Location: Patient: home Provider: office   I discussed the limitations of evaluation and management by telemedicine and the availability of in person appointments. The patient expressed understanding and agreed to proceed.  History of Present Illness: Natalie Wagner is doing well. Her niece was born in March and the family is very happy. Her mood is "pretty good" but she has had a few bad days a week. On those days she is sad, isolates and has low motivation.  Her anxiety is ongoing and is a daily struggle. She has ruminating thoughts and it worse when she is alone. She has anxiety in public when she is around people.  It has improved a little since starting Cogentin. She does tapping and is coloring and watching movies. She is taking her biotin daily. Her IBS flares up randomly. She is snoring but wakes up feeling refreshed. She is sleeping about 8-9 hrs/night and states that it makes her sleepy. Her appetite is good and is making healthy food choices. She is gardening some vegetables. She denies SI/HI. The paranoia is minimal and manageable. She doesn't trust people in general and stays home most days. Natalie Wagner denies AVH and ideas of reference.     Observations/Objective: Psychiatric Specialty Exam: ROS  There were no vitals taken for this visit.There is no height or weight on file to calculate BMI.  General Appearance: Casual and Fairly Groomed  Eye Contact:  Good  Speech:  Clear and Coherent and Normal Rate  Volume:  Normal  Mood:  Euthymic  Affect:  Blunt  Thought Process: concrete Coherent and Descriptions of Associations: Circumstantial  Orientation:  Full (Time, Place, and Person)  Thought Content:  Paranoid Ideation  Suicidal Thoughts:  No  Homicidal Thoughts:  No   Memory:  Immediate;   Good  Judgement:  Good  Insight:  Good  Psychomotor Activity:  Normal  Concentration:  Concentration: Good  Recall:  Good  Fund of Knowledge:  Good  Language:  Good  Akathisia:  No  Handed:  Right  AIMS (if indicated):     Assets:  Communication Skills Desire for Improvement Financial Resources/Insurance Housing Leisure Time Resilience Social Support Talents/Skills Vocational/Educational  ADL's:  Intact  Cognition:  WNL  Sleep:        Assessment and Plan:     03/11/2023   11:43 AM 01/07/2023   11:16 AM 09/23/2022   11:08 AM 09/17/2022   11:48 AM 06/25/2022   10:38 AM  Depression screen PHQ 2/9  Decreased Interest 0 0 1 0 0  Down, Depressed, Hopeless 1 3 1 1 3   PHQ - 2 Score 1 3 2 1 3   Altered sleeping  0 3  0  Tired, decreased energy  0 1  0  Change in appetite  0 3  0  Feeling bad or failure about yourself   1 0  1  Trouble concentrating  2 0  2  Moving slowly or fidgety/restless  0 1  0  Suicidal thoughts  0 0  0  PHQ-9 Score  6 10  6   Difficult doing work/chores  Somewhat difficult Somewhat difficult  Somewhat difficult    Flowsheet Row [facility] Video Visit from 01/07/2023 in [facility] Video  Visit from 09/17/2022 in [facility]  C-SSRS RISK CATEGORY No Risk No Risk No Risk          Pt is aware that these meds carry a teratogenic risk. Pt will discuss plan of action if she does or plans to become pregnant in the future.  Status of current problems: ongoing anxiety, depression is manageable. She denies overwhelming paranoia but is not going out much.    Medication management with supportive therapy. Risks and benefits, side effects and alternative treatment options discussed with patient. Pt was given an opportunity to ask questions about medication, illness, and treatment. All current  psychiatric medications have been reviewed and discussed with the patient and adjusted as clinically appropriate.  Pt verbalized understanding and verbal consent obtained for treatment.  Meds:  1. Schizophrenia, disorganized type (HCC) - benztropine (COGENTIN) 2 MG tablet; Take 1 tablet (2 mg total) by mouth 2 (two) times daily.  Dispense: 180 tablet; Refill: 0 - OLANZapine zydis (ZYPREXA) 20 MG disintegrating tablet; Take 1 tablet (20 mg total) by mouth at bedtime.  Dispense: 90 tablet; Refill: 0  2. MDD (major depressive disorder), recurrent episode, moderate (HCC) - OLANZapine zydis (ZYPREXA) 20 MG disintegrating tablet; Take 1 tablet (20 mg total) by mouth at bedtime.  Dispense: 90 tablet; Refill: 0  3. Irritable bowel syndrome with constipation - polyethylene glycol powder (GLYCOLAX/MIRALAX) 17 GM/SCOOP powder; Take 17 g by mouth daily as needed.  Dispense: 850 g; Refill: 2     Labs: labs and EKG due November 2024    Therapy: brief supportive therapy provided. Discussed psychosocial stressors in detail.     Collaboration of Care: Other none  Patient/Guardian was advised Release of Information must be obtained prior to any record release in order to collaborate their care with an outside provider. Patient/Guardian was advised if they have not already done so to contact the registration department to sign all necessary forms in order for [place] to release information regarding their care.   Consent: Patient/Guardian gives verbal consent for treatment and assignment of benefits for services provided during this visit. Patient/Guardian expressed understanding and agreed to proceed.      Follow Up Instructions: Follow up in 3-4 months or sooner if needed  Patient informed that I am leaving [facility] in 05/2023 and I relayed that they will be getting a new provider after that. Patient verbalized understanding and agreed with the plan.     I discussed the assessment and treatment plan with  the patient. The patient was provided an opportunity to ask questions and all were answered. The patient agreed with the plan and demonstrated an understanding of the instructions.   The patient was advised to call back or seek an in-person evaluation if the symptoms worsen or if the condition fails to improve as anticipated.  I provided 17 minutes of non-face-to-face time during this encounter.   Oletta Darter, MD

## 2023-03-15 ENCOUNTER — Encounter (HOSPITAL_COMMUNITY): Payer: Self-pay

## 2023-03-25 ENCOUNTER — Ambulatory Visit (HOSPITAL_COMMUNITY): Payer: Medicaid Other | Admitting: Licensed Clinical Social Worker

## 2023-04-08 ENCOUNTER — Encounter: Payer: Self-pay | Admitting: Family Medicine

## 2023-04-22 ENCOUNTER — Ambulatory Visit (HOSPITAL_COMMUNITY): Payer: Medicaid Other | Admitting: Licensed Clinical Social Worker

## 2023-05-20 ENCOUNTER — Ambulatory Visit (HOSPITAL_COMMUNITY): Payer: Medicaid Other | Admitting: Licensed Clinical Social Worker

## 2023-06-01 ENCOUNTER — Other Ambulatory Visit (HOSPITAL_COMMUNITY): Payer: Self-pay

## 2023-06-01 ENCOUNTER — Telehealth: Payer: Self-pay | Admitting: *Deleted

## 2023-06-01 MED ORDER — LINACLOTIDE 145 MCG PO CAPS: 145.0000 ug | ORAL_CAPSULE | Freq: Every day | ORAL | 2 refills | Status: DC

## 2023-06-01 NOTE — Telephone Encounter (Signed)
I processed a prior auth request and it came back saying that [facility] indicated it's too soon to refill this medication. Next fill is in August and can process a PA at that time. Please have the pharmacy or patient reach out a couple days before it is next due to allow time to process.

## 2023-06-01 NOTE — Telephone Encounter (Signed)
Please submit PA for Linzess. Patient current authorization expires on 06/05/23

## 2023-06-01 NOTE — Telephone Encounter (Signed)
Noted  

## 2023-06-03 ENCOUNTER — Ambulatory Visit (INDEPENDENT_AMBULATORY_CARE_PROVIDER_SITE_OTHER): Payer: Federal, State, Local not specified - PPO | Admitting: Licensed Clinical Social Worker

## 2023-06-03 ENCOUNTER — Encounter (HOSPITAL_COMMUNITY): Payer: Self-pay | Admitting: Licensed Clinical Social Worker

## 2023-06-03 DIAGNOSIS — F201 Disorganized schizophrenia: Secondary | ICD-10-CM

## 2023-06-03 NOTE — Progress Notes (Signed)
Virtual Visit via Video Note  I connected with Natalie Wagner on 06/03/23 at 11:00 AM EDT by a video enabled telemedicine application and verified that I am speaking with the correct person using two identifiers.  Location: Patient: home Provider: home office   I discussed the limitations of evaluation and management by telemedicine and the availability of in person appointments. The patient expressed understanding and agreed to proceed.   I discussed the assessment and treatment plan with the patient. The patient was provided an opportunity to ask questions and all were answered. The patient agreed with the plan and demonstrated an understanding of the instructions.   The patient was advised to call back or seek an in-person evaluation if the symptoms worsen or if the condition fails to improve as anticipated.  I provided 55 minutes of non-face-to-face time during this encounter.   Natalie Melter, LCSW   THERAPIST PROGRESS NOTE  Session Time: 11:00am-11:55am  Participation Level: Active  Behavioral Response: NeatAlertEuthymic  Type of Therapy: Individual Therapy  Treatment Goals addressed:  I want to work on my anxiety, depression, self-confidence, and my ability to socialize". Natalie Wagner will report reduced depression and anxiety 5 out of 7 days.    ProgressTowards Goals: Progressing  Interventions: CBT  Summary: Natalie Wagner is a 27 y.o. female who presents with Schizophrenia, disorganized type.   Suicidal/Homicidal: Nowithout intent/plan  Therapist Response: Natalie Wagner engaged well in individual virtual session with Facilities manager. Clinician utilized CBT to process thoughts, feelings, and interactions at home and with family. Natalie Wagner Wagner updates about baby niece that is now 46 months old. Clinician processed thoughts and feelings about being an aunt and her comfort with spending time with the baby. Clinician discussed [name]'s daily activities and her increasing  discomfort with going out of the house. Clinician explored levels of agorophobia and how disruptive this is in her life. Clinician discussed activities that would encourage her to go out. Clinician also identified possibility of finding a PSR for younger adults. Natalie Wagner that she is doing some online school and tutoring to get herself comfortable with learning again. She Wagner increased experience and love of cooking, watching videos and trying out new recipes. This has increased her self-esteem and it gives her a way to be creative and productive.    Plan: Return again in 4 weeks.  Diagnosis: Schizophrenia, disorganized type (HCC)  Collaboration of Care: Patient refused AEB none required   Patient/Guardian was advised Release of Information must be obtained prior to any record release in order to collaborate their care with an outside provider. Patient/Guardian was advised if they have not already done so to contact the registration department to sign all necessary forms in order for [place] to release information regarding their care.   Consent: Patient/Guardian gives verbal consent for treatment and assignment of benefits for services provided during this visit. Patient/Guardian expressed understanding and agreed to proceed.   Natalie Heck [place], LCSW 06/03/2023

## 2023-06-14 ENCOUNTER — Encounter (HOSPITAL_COMMUNITY): Payer: Self-pay | Admitting: Student

## 2023-06-14 ENCOUNTER — Ambulatory Visit (HOSPITAL_BASED_OUTPATIENT_CLINIC_OR_DEPARTMENT_OTHER): Payer: Federal, State, Local not specified - PPO | Admitting: Student

## 2023-06-14 VITALS — BP 118/78 | HR 71 | Ht 59.0 in | Wt 190.0 lb

## 2023-06-14 DIAGNOSIS — Z79899 Other long term (current) drug therapy: Secondary | ICD-10-CM

## 2023-06-14 DIAGNOSIS — K581 Irritable bowel syndrome with constipation: Secondary | ICD-10-CM

## 2023-06-14 DIAGNOSIS — F331 Major depressive disorder, recurrent, moderate: Secondary | ICD-10-CM

## 2023-06-14 DIAGNOSIS — F201 Disorganized schizophrenia: Secondary | ICD-10-CM

## 2023-06-14 MED ORDER — BENZTROPINE MESYLATE 2 MG PO TABS: 2.0000 mg | ORAL_TABLET | Freq: Two times a day (BID) | ORAL | 0 refills | Status: DC

## 2023-06-14 MED ORDER — OLANZAPINE 20 MG PO TBDP: 20.0000 mg | ORAL_TABLET | Freq: Every day | ORAL | 0 refills | Status: DC
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
## 2023-06-14 NOTE — Progress Notes (Incomplete)
BH MD Outpatient Progress Note  06/14/2023 8:59 AM Natalie Wagner  MRN:  [id number]  Assessment:  Natalie Wagner presents for follow-up evaluation in-person. Today, 06/14/23, patient reports overall stability on her current medication regimen.   Identifying Information: Natalie Wagner is a 27 y.o. female with a history of schizophrenia, disorganized type, and MDD  who is an established patient with [facility] for follow-up for management of medications.   Risk Assessment: An assessment of suicide and violence risk factors was performed as part of this evaluation and is not significantly changed from the last visit.             While future psychiatric events cannot be accurately predicted, the patient does not currently require acute inpatient psychiatric care and does not currently meet [facility] involuntary commitment criteria.          Plan:  # Schizophrenia, disorganized type #Long-term Current Use of High Risk Medication Past medication trials: Abilify Status of problem: Stable Interventions: -- Continue Zyprexa 20 mg at bedtime -- Continue Cogentin 2 mg BID for EPS  -- Monitoring labs: CBC, CMP, A1c, TSH, lipid profile, and prolactin.   Return to care in 6-8 weeks  Patient was given contact information for behavioral health clinic and was instructed to call 911 for emergencies.    Patient and plan of care will be discussed with the Attending MD ,Dr. Mercy Riding, who agrees with the above statement and plan.   Subjective:  Chief Complaint: No chief complaint on file.   Interval History: Still not getting out of home,  but fear.  Sleeping well, throughout the night. Appetite intact. Mostly  Taking medication 12-1 PM, For a couple of years. Willing to  [employer], school .   [facility], youngest person there, ans patient and mom felt uncomfortable.   Denies AVH. Denies paranoia.   Denies substance use.   Linzess has helped with BM,  going daily. Irregular cycles with IUD.  Biotin gummies.   Breasts when squeezing, express milky discharge.   Mom: things are in a good place.   Visit Diagnosis: No diagnosis found.  Past Psychiatric History:  Diagnoses: Schizophrenia, disorganized type, MDD, ODD Medication trials: Zyprexa, Cogentin (symptomatic with 1 mg BID), Abilify (ineffective).  Previous psychiatrist/therapist: Dr. Michae Kava. Currently sees Alvera Singh LCSW for therapy Hospitalizations: [facility]- [facility] Suicide attempts: *** SIB: *** Hx of violence towards others: *** Current access to guns: *** Hx of trauma/abuse: *** Substance use: ***  Past Medical History:  Past Medical History:  Diagnosis Date  . Asthma    prn inhaler  . Constipation 05/05/2013  . Difficulty swallowing pills   . Eating disorder 07/27/2016  . Elevated prolactin level 12/27/2014  . Galactorrhea 12/27/2014  . History of seizures as a child    mother states were triggered by migraines; no seizures in > 7 yr.  . 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 Migraines   . Schizophrenia (HCC)   . Simple cyst of kidney 01/24/2016  . Sleep apnea     Past Surgical History:  Procedure Laterality Date  . COLONOSCOPY    . MRI     under anesthesia  . 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  UMBILICAL HERNIA REPAIR      Family Psychiatric History: ***  Family History:  Family History  Problem Relation Age of Onset  . Diabetes Maternal Grandfather   . Hypertension Maternal Grandfather   . Heart disease Maternal Grandfather   . Kidney disease Maternal Grandfather   . Anesthesia problems Maternal Grandfather        hx. of being hard to wake up post-op  . Hypertension Maternal Grandmother   . Stroke  Maternal Grandmother   . Asthma Mother   . Hypertension Maternal Uncle   . Depression Sister   . Cancer Other     Social History:  Academic/Vocational: *** Social History   Socioeconomic History  . Marital status: Single    Spouse name: Not on file  . Number of children: Not on file  . Years of education: Not on file  . Highest education level: Not on file  Occupational History  . Not on file  Tobacco Use  . Smoking status: Never  . Smokeless tobacco: Never  Vaping Use  . Vaping status: Never Used  Substance and Sexual Activity  . Alcohol use: No    Alcohol/week: 0.0 standard drinks of alcohol  . Drug use: No  . Sexual activity: Never    Birth control/protection: I.U.D.  Other Topics Concern  . Not on file  Social History Narrative  . Not on file   Social Determinants of Health   Financial Resource Strain: Not on file  Food Insecurity: Not on file  Transportation Needs: Not on file  Physical Activity: Not on file  Stress: Not on file  Social Connections: Not on file    Allergies:  Allergies  Allergen Reactions  . Lactose Intolerance (Gi) Diarrhea    Current Medications: Current Outpatient Medications  Medication Sig Dispense Refill  . albuterol (VENTOLIN HFA) 108 (90 Base) MCG/ACT inhaler Inhale 1-2 puffs into the lungs every 6 (six) hours as needed for wheezing or shortness of breath. 6.7 g 0  . benztropine (COGENTIN) 2 MG tablet Take 1 tablet (2 mg total) by mouth 2 (two) times daily. 180 tablet 0  . cetirizine (ZYRTEC ALLERGY) 10 MG tablet Take 1 tablet (10 mg total) by mouth daily. 30 tablet 0  . Dextromethorphan-Benzocaine (CEPACOL SORE THROAT & COUGH) 5-7.5 MG LOZG Use as directed 1 tablet in the mouth or throat every 4 (four) hours as needed. (Patient not taking: Reported on 03/26/2022) 36 lozenge 0  . fluticasone (FLONASE) 50 MCG/ACT nasal spray Place 2 sprays into both nostrils daily. (Patient not taking: Reported on 03/11/2023) 9.9 mL 0  . hyoscyamine  (LEVSIN SL) 0.125 MG SL tablet Place 1 tablet (0.125 mg total) under the tongue in the morning and at bedtime. 180 tablet 4  . levonorgestrel (MIRENA) 20 MCG/24HR IUD 1 Intra Uterine Device (1 each total) by Intrauterine route once. 1 each 0  . linaclotide (LINZESS) 145 MCG CAPS capsule Take 1 capsule (145 mcg total) by mouth daily before breakfast. 30 capsule 2  . Melatonin 5 MG TABS Take 1 tablet by mouth daily as needed (sleep).  (Patient not taking: Reported on 03/11/2023)    . naproxen (NAPROSYN) 500 MG tablet Take 1 tablet (500 mg total) by mouth 2 (two) times daily. 30 tablet 0  . OLANZapine zydis (ZYPREXA) 20 MG disintegrating tablet Take 1 tablet (20 mg total) by mouth at bedtime. 90 tablet 0  . polyethylene glycol powder (GLYCOLAX/MIRALAX) 17 GM/SCOOP powder Take 17 g by mouth daily as needed. 850  g 2   No current facility-administered medications for this visit.    ROS: Review of Systems ***  Objective:  Psychiatric Specialty Exam: There were no vitals taken for this visit.There is no height or weight on file to calculate BMI.  General Appearance: {Appearance:22683}  Eye Contact:  {BHH EYE CONTACT:22684}  Speech:  {Speech:22685}  Volume:  {Volume (PAA):22686}  Mood:  {BHH MOOD:22306}  Affect:  {Affect (PAA):22687}  Thought Content: {Thought Content:22690}   Suicidal Thoughts:  {ST/HT (PAA):22692}  Homicidal Thoughts:  {ST/HT (PAA):22692}  Thought Process:  {Thought Process (PAA):22688}  Orientation:  {BHH ORIENTATION (PAA):22689}    Memory: {BHH MEMORY:22881}  Judgment:  {Judgement (PAA):22694}  Insight:  {Insight (PAA):22695}  Concentration:  {Concentration:21399}  Recall: not formally assessed ***  Fund of Knowledge: {BHH GOOD/FAIR/POOR:22877}  Language: {BHH GOOD/FAIR/POOR:22877}  Psychomotor Activity:  {Psychomotor (PAA):22696}  Akathisia:  {BHH YES OR NO:22294}  AIMS (if indicated): {Desc; done/not:10129}  Assets:  {Assets (PAA):22698}  ADL's:  {BHH ZHY'Q:65784}   Cognition: {chl bhh cognition:304700322}  Sleep:  {BHH GOOD/FAIR/POOR:22877}   PE: General: well-appearing; no acute distress *** Pulm: no increased work of breathing on room air *** Strength & Muscle Tone: {desc; muscle tone:32375} Neuro: no focal neurological deficits observed *** Gait & Station: {PE GAIT ED ONGE:95284}  Metabolic Disorder Labs: Lab Results  Component Value Date   HGBA1C 5.5 09/29/2022   MPG 105 03/02/2017   MPG 94 07/27/2016   Lab Results  Component Value Date   PROLACTIN 60.3 (H) 09/29/2022   PROLACTIN 39.6 (H) 05/13/2021   Lab Results  Component Value Date   CHOL 203 (H) 09/29/2022   TRIG 69 09/29/2022   HDL 34 (L) 09/29/2022   CHOLHDL 6.0 (H) 09/29/2022   VLDL 16 03/02/2017   LDLCALC 156 (H) 09/29/2022   LDLCALC 166 (H) 05/13/2021   Lab Results  Component Value Date   TSH 3.040 09/29/2022   TSH 2.130 05/13/2021    Therapeutic Level Labs: No results found for: "LITHIUM" No results found for: "VALPROATE" No results found for: "CBMZ"  Screenings: PHQ2-9    Flowsheet Row Video Visit from [facility]11/2023 in [facility] Video Visit from 01/07/2023 in [facility] Office Visit from 09[facility]23/2022 in [facility] Video Visit from 09/17/2022 in [facility] Video Visit from 06/25/2022 in [facility]  PHQ-2 Total Score 1 3 2 1 3   PHQ-9 Total Score -- 6 10 -- 6      Flowsheet Row Video Visit from 03/11/2023 in [facility] Video Visit from 01[facility]07/2023 in [facility] Video Visit from 09/17/2022 in [facility]  C-SSRS RISK CATEGORY No Risk No Risk No Risk       Collaboration of Care: Collaboration of Care: {BH OP Collaboration of Care:21014065}  Patient/Guardian was  advised Release of Information must be obtained prior to any record release in order to collaborate their care with an outside provider. Patient/Guardian was advised if they have not already done so to contact the registration department to sign all necessary forms in order for [place] to release information regarding their care.   Consent: Patient/Guardian gives verbal consent for treatment and assignment of benefits for services provided during this visit. Patient/Guardian expressed understanding and agreed to proceed.   Lamar Sprinkles, MD 06/14/2023, 8:59 AM

## 2023-06-18 NOTE — Addendum Note (Signed)
Addended by: Everlena Cooper on: 06/18/2023 03:07 PM   Modules accepted: Level of Service

## 2023-06-24 ENCOUNTER — Telehealth (HOSPITAL_COMMUNITY): Payer: Federal, State, Local not specified - PPO | Admitting: Licensed Clinical Social Worker

## 2023-06-24 ENCOUNTER — Ambulatory Visit (HOSPITAL_COMMUNITY): Payer: Federal, State, Local not specified - PPO | Admitting: Licensed Clinical Social Worker

## 2023-06-29 ENCOUNTER — Ambulatory Visit (HOSPITAL_COMMUNITY): Payer: Federal, State, Local not specified - PPO

## 2023-06-30 ENCOUNTER — Ambulatory Visit (HOSPITAL_BASED_OUTPATIENT_CLINIC_OR_DEPARTMENT_OTHER): Payer: Federal, State, Local not specified - PPO

## 2023-06-30 ENCOUNTER — Other Ambulatory Visit (HOSPITAL_COMMUNITY): Payer: Self-pay | Admitting: Psychiatry

## 2023-06-30 ENCOUNTER — Other Ambulatory Visit (HOSPITAL_COMMUNITY): Payer: Self-pay

## 2023-06-30 ENCOUNTER — Encounter (HOSPITAL_COMMUNITY): Payer: Self-pay

## 2023-06-30 VITALS — BP 120/76 | HR 88 | Ht 59.0 in | Wt 190.0 lb

## 2023-06-30 DIAGNOSIS — F201 Disorganized schizophrenia: Secondary | ICD-10-CM | POA: Diagnosis not present

## 2023-06-30 DIAGNOSIS — Z79899 Other long term (current) drug therapy: Secondary | ICD-10-CM

## 2023-06-30 NOTE — Progress Notes (Signed)
Patient arrived today for her due labs. Patient was a little hesitant as she is not a fan of needles. Blood was drawn from patients left AC, patient tolerated well and without compliant.

## 2023-07-02 LAB — COMPREHENSIVE METABOLIC PANEL
ALT: 16 IU/L (ref 0–32)
AST: 20 IU/L (ref 0–40)
Albumin: 4.5 g/dL (ref 4.0–5.0)
Alkaline Phosphatase: 104 IU/L (ref 44–121)
BUN/Creatinine Ratio: 12 (ref 9–23)
BUN: 10 mg/dL (ref 6–20)
Bilirubin Total: 0.3 mg/dL (ref 0.0–1.2)
CO2: 25 mmol/L (ref 20–29)
Calcium: 9.6 mg/dL (ref 8.7–10.2)
Chloride: 100 mmol/L (ref 96–106)
Creatinine, Ser: 0.84 mg/dL (ref 0.57–1.00)
Globulin, Total: 2.6 g/dL (ref 1.5–4.5)
Glucose: 88 mg/dL (ref 70–99)
Potassium: 4.8 mmol/L (ref 3.5–5.2)
Sodium: 141 mmol/L (ref 134–144)
Total Protein: 7.1 g/dL (ref 6.0–8.5)
eGFR: 98 mL/min/{1.73_m2} (ref >59)

## 2023-07-02 LAB — CBC WITH DIFFERENTIAL/PLATELET
Basophils Absolute: 0 10*3/uL (ref 0.0–0.2)
Basos: 0 %
EOS (ABSOLUTE): 0 10*3/uL (ref 0.0–0.4)
Eos: 0 %
Hematocrit: 36.4 % (ref 34.0–46.6)
Hemoglobin: 11.7 g/dL (ref 11.1–15.9)
Immature Grans (Abs): 0 10*3/uL (ref 0.0–0.1)
Immature Granulocytes: 0 %
Lymphocytes Absolute: 2.5 10*3/uL (ref 0.7–3.1)
Lymphs: 30 %
MCH: 29.4 pg (ref 26.6–33.0)
MCHC: 32.1 g/dL (ref 31.5–35.7)
MCV: 92 fL (ref 79–97)
Monocytes Absolute: 0.7 10*3/uL (ref 0.1–0.9)
Monocytes: 8 %
Neutrophils Absolute: 5 10*3/uL (ref 1.4–7.0)
Neutrophils: 62 %
Platelets: 329 10*3/uL (ref 150–450)
RBC: 3.98 x10E6/uL (ref 3.77–5.28)
RDW: 11.7 % (ref 11.7–15.4)
WBC: 8.1 10*3/uL (ref 3.4–10.8)

## 2023-07-02 LAB — LIPID PANEL
Chol/HDL Ratio: 5.6 ratio — ABNORMAL HIGH (ref 0.0–4.4)
Cholesterol, Total: 202 mg/dL — ABNORMAL HIGH (ref 100–199)
HDL: 36 mg/dL — ABNORMAL LOW (ref >39)
LDL Chol Calc (NIH): 150 mg/dL — ABNORMAL HIGH (ref 0–99)
Triglycerides: 90 mg/dL (ref 0–149)
VLDL Cholesterol Cal: 16 mg/dL (ref 5–40)

## 2023-07-02 LAB — HEMOGLOBIN A1C
Est. average glucose Bld gHb Est-mCnc: 120 mg/dL
Hgb A1c MFr Bld: 5.8 % — ABNORMAL HIGH (ref 4.8–5.6)

## 2023-07-02 LAB — PROLACTIN: Prolactin: 49.3 ng/mL — ABNORMAL HIGH (ref 4.8–33.4)

## 2023-07-02 LAB — TSH: TSH: 3.13 u[IU]/mL (ref 0.450–4.500)

## 2023-07-23 ENCOUNTER — Ambulatory Visit (HOSPITAL_COMMUNITY): Payer: Federal, State, Local not specified - PPO

## 2023-07-29 ENCOUNTER — Telehealth: Payer: Self-pay | Admitting: Physician Assistant

## 2023-07-29 ENCOUNTER — Ambulatory Visit (INDEPENDENT_AMBULATORY_CARE_PROVIDER_SITE_OTHER): Payer: Federal, State, Local not specified - PPO | Admitting: Licensed Clinical Social Worker

## 2023-07-29 ENCOUNTER — Other Ambulatory Visit: Payer: Self-pay

## 2023-07-29 DIAGNOSIS — F201 Disorganized schizophrenia: Secondary | ICD-10-CM | POA: Diagnosis not present

## 2023-07-29 MED ORDER — LUBIPROSTONE 8 MCG PO CAPS: 8.0000 ug | ORAL_CAPSULE | Freq: Two times a day (BID) | ORAL | 1 refills | Status: DC

## 2023-07-29 NOTE — Telephone Encounter (Signed)
Please see note below and advise. Pt  is requesting an alternative for Linzess.

## 2023-07-29 NOTE — Telephone Encounter (Signed)
Inbound call from patient stating she has been experiencing a lot of diarrhea and bloating drom linzess medication. Patient states she has had to stop taking linzess due to these side effects. Patient is requesting to speak about an alternative medication. Please advise, thank you.

## 2023-07-29 NOTE — Telephone Encounter (Signed)
Prescription has been sent to pharmacy. Left detailed message on pt identified voicemail.

## 2023-08-02 ENCOUNTER — Other Ambulatory Visit (HOSPITAL_COMMUNITY): Payer: Self-pay

## 2023-08-02 ENCOUNTER — Telehealth: Payer: Self-pay

## 2023-08-02 ENCOUNTER — Encounter (HOSPITAL_COMMUNITY): Payer: Self-pay | Admitting: Licensed Clinical Social Worker

## 2023-08-02 NOTE — Telephone Encounter (Signed)
Please submit PA for Amitiza.

## 2023-08-02 NOTE — Telephone Encounter (Signed)
Pharmacy Patient Advocate Encounter   Received notification from Pt Calls Messages that prior authorization for Lubiprostone capsules is required/requested.   Insurance verification completed.   The patient is insured through [facility] .   Per test claim: PA required; PA submitted to [employer] [facility] via CoverMyMeds Key/confirmation #/EOC [id number] Status is pending

## 2023-08-02 NOTE — Telephone Encounter (Signed)
PA request has been Submitted. New Encounter created for follow up. For additional info see Pharmacy Prior Auth telephone encounter from 08-02-2023.

## 2023-08-02 NOTE — Progress Notes (Signed)
Virtual Visit via Video Note  I connected with Natalie Wagner on 07/29/23 at 11:00 AM EDT by a video enabled telemedicine application and verified that I am speaking with the correct person using two identifiers.  Location: Patient: home Provider: home office   I discussed the limitations of evaluation and management by telemedicine and the availability of in person appointments. The patient expressed understanding and agreed to proceed.   I discussed the assessment and treatment plan with the patient. The patient was provided an opportunity to ask questions and all were answered. The patient agreed with the plan and demonstrated an understanding of the instructions.   The patient was advised to call back or seek an in-person evaluation if the symptoms worsen or if the condition fails to improve as anticipated.  I provided 45 minutes of non-face-to-face time during this encounter.   [name], LCSW   THERAPIST PROGRESS NOTE  Session Time: 11:00am-11:45am  Participation Level: Active  Behavioral Response: NeatAlertEuthymic  Type of Therapy: Individual Therapy  Treatment Goals addressed: I want to work on my anxiety, depression, self-confidence, and my ability to socialize". Natalie Wagner will report reduced depression and anxiety 5 out of 7 days.    ProgressTowards Goals: Progressing  Interventions: Motivational Interviewing  Summary: Natalie Wagner is a 27 y.o. female who presents with Schizophrenia, disorganized type.   Suicidal/Homicidal: Nowithout intent/plan  Therapist Response: Natalie Wagner engaged well in individual virtual session with Facilities manager. Clinician utilized MI OARS to reflect and summarize thoughts, feelings, and interactions. Natalie Wagner shared that she has been doing well, spending time with family, working on her health, and learning. Clinician discussed updates in health and her reactions to certain medications. Clinician explored Natalie Wagner's daily routine  and explored self esteem. Natalie Wagner shared that she is still having challenges with her self confidence regarding school and being independent. Clinician reflected these challenges and noted the importance of doing things at a comfortable pace in order to maintain her current stability. Clinician reviewed coping skills for anxiety.   Plan: Return again in 4-6 weeks.  Diagnosis: Schizophrenia, disorganized type (HCC)  Collaboration of Care: Psychiatrist AEB reviewed note from Dr. Alfonse Flavors  Patient/Guardian was advised Release of Information must be obtained prior to any record release in order to collaborate their care with an outside provider. Patient/Guardian was advised if they have not already done so to contact the registration department to sign all necessary forms in order for [place] to release information regarding their care.   Consent: Patient/Guardian gives verbal consent for treatment and assignment of benefits for services provided during this visit. Patient/Guardian expressed understanding and agreed to proceed.   Natalie Heck Spencer, LCSW 08/02/2023

## 2023-08-02 NOTE — Telephone Encounter (Signed)
Inbound call from patient states she needs a PA for medication Lubiproston. Please advise,

## 2023-08-05 ENCOUNTER — Other Ambulatory Visit (HOSPITAL_COMMUNITY): Payer: Self-pay

## 2023-08-05 NOTE — Telephone Encounter (Signed)
Pharmacy Patient Advocate Encounter  Received notification from [facility] that Prior Authorization for LUBIPROSTONE has been APPROVED from 08.24.24 to 09.23.25   PA #/Case ID/Reference #: [id number]

## 2023-08-11 ENCOUNTER — Telehealth (HOSPITAL_COMMUNITY): Payer: Federal, State, Local not specified - PPO | Admitting: Student

## 2023-08-16 ENCOUNTER — Telehealth (HOSPITAL_COMMUNITY): Payer: Federal, State, Local not specified - PPO | Admitting: Student

## 2023-08-16 ENCOUNTER — Telehealth (HOSPITAL_BASED_OUTPATIENT_CLINIC_OR_DEPARTMENT_OTHER): Payer: Federal, State, Local not specified - PPO | Admitting: Student

## 2023-08-16 DIAGNOSIS — F201 Disorganized schizophrenia: Secondary | ICD-10-CM | POA: Diagnosis not present

## 2023-08-16 DIAGNOSIS — Z79899 Other long term (current) drug therapy: Secondary | ICD-10-CM
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
## 2023-08-18 ENCOUNTER — Encounter (HOSPITAL_COMMUNITY): Payer: Self-pay | Admitting: Student

## 2023-08-20 ENCOUNTER — Other Ambulatory Visit: Payer: Self-pay | Admitting: Physician Assistant

## 2023-09-02 ENCOUNTER — Ambulatory Visit (HOSPITAL_COMMUNITY): Payer: Federal, State, Local not specified - PPO | Admitting: Licensed Clinical Social Worker

## 2023-09-08 ENCOUNTER — Ambulatory Visit (HOSPITAL_COMMUNITY): Payer: Federal, State, Local not specified - PPO | Admitting: Licensed Clinical Social Worker

## 2023-09-16 ENCOUNTER — Other Ambulatory Visit (HOSPITAL_COMMUNITY): Payer: Self-pay | Admitting: Student

## 2023-09-16 DIAGNOSIS — F201 Disorganized schizophrenia: Secondary | ICD-10-CM

## 2023-09-16 DIAGNOSIS — F331 Major depressive disorder, recurrent, moderate: Secondary | ICD-10-CM

## 2023-09-30 ENCOUNTER — Ambulatory Visit (HOSPITAL_COMMUNITY): Payer: Federal, State, Local not specified - PPO | Admitting: Licensed Clinical Social Worker

## 2023-10-08 ENCOUNTER — Encounter: Payer: Self-pay | Admitting: Family Medicine

## 2023-10-11 ENCOUNTER — Encounter (HOSPITAL_COMMUNITY): Payer: Self-pay | Admitting: Student

## 2023-10-11 ENCOUNTER — Telehealth (HOSPITAL_COMMUNITY): Payer: Federal, State, Local not specified - PPO | Admitting: Student

## 2023-10-11 DIAGNOSIS — F201 Disorganized schizophrenia: Secondary | ICD-10-CM | POA: Diagnosis not present

## 2023-10-11 DIAGNOSIS — Z79899 Other long term (current) drug therapy: Secondary | ICD-10-CM | POA: Diagnosis not present

## 2023-10-11 DIAGNOSIS — F330 Major depressive disorder, recurrent, mild: Secondary | ICD-10-CM

## 2023-10-11 MED ORDER — OLANZAPINE 20 MG PO TBDP: 20.0000 mg | ORAL_TABLET | Freq: Every day | ORAL | 1 refills | Status: DC
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
## 2023-10-13 NOTE — Addendum Note (Signed)
Addended by: Everlena Cooper on: 10/13/2023 09:31 AM   Modules accepted: Level of Service

## 2023-10-17 ENCOUNTER — Other Ambulatory Visit: Payer: Self-pay

## 2023-10-17 ENCOUNTER — Emergency Department (HOSPITAL_BASED_OUTPATIENT_CLINIC_OR_DEPARTMENT_OTHER)
Admission: EM | Admit: 2023-10-17 | Discharge: 2023-10-17 | Disposition: A | Discharge status: Home / Self Care | Payer: Federal, State, Local not specified - PPO | Attending: Emergency Medicine | Admitting: Emergency Medicine

## 2023-10-17 ENCOUNTER — Encounter (HOSPITAL_BASED_OUTPATIENT_CLINIC_OR_DEPARTMENT_OTHER): Payer: Self-pay

## 2023-10-17 DIAGNOSIS — J45909 Unspecified asthma, uncomplicated: Secondary | ICD-10-CM | POA: Diagnosis not present

## 2023-10-17 DIAGNOSIS — J111 Influenza due to unidentified influenza virus with other respiratory manifestations: Secondary | ICD-10-CM | POA: Insufficient documentation

## 2023-10-17 DIAGNOSIS — Z1152 Encounter for screening for COVID-19: Secondary | ICD-10-CM | POA: Insufficient documentation

## 2023-10-17 DIAGNOSIS — R0981 Nasal congestion: Secondary | ICD-10-CM | POA: Diagnosis not present

## 2023-10-17 DIAGNOSIS — J101 Influenza due to other identified influenza virus with other respiratory manifestations: Secondary | ICD-10-CM | POA: Diagnosis not present

## 2023-10-17 LAB — RESP PANEL BY RT-PCR (RSV, FLU A&B, COVID)  RVPGX2
Influenza A by PCR: NEGATIVE
Influenza B by PCR: NEGATIVE
Resp Syncytial Virus by PCR: NEGATIVE
SARS Coronavirus 2 by RT PCR: NEGATIVE

## 2023-10-17 NOTE — ED Provider Notes (Addendum)
[facility] Provider Note   CSN: [id number] Arrival date & time: 10/17/23  0809     History  Chief Complaint  Patient presents with   Cough    Natalie Wagner is a 27 y.o. female.  Patient with history of schizophrenia.  Patient here with mother.  Patient with 1 week history of congestion fever chills body aches productive cough sore throat.  No nausea vomiting or diarrhea.  Temp here 97.9 heart rate 97 oxygen sats on room air 97% blood pressure 133/85 respirations 18.  In addition patient's voice is hoarse.  Patient has been using over-the-counter Mucinex cold and flu products.  Past medical history significant for asthma migraines schizophrenia history of seizures as a child.  Patient is never used tobacco products.       Home Medications Prior to Admission medications   Medication Sig Start Date End Date Taking? Authorizing Provider  albuterol (VENTOLIN HFA) 108 (90 Base) MCG/ACT inhaler Inhale 1-2 puffs into the lungs every 6 (six) hours as needed for wheezing or shortness of breath. 11/22/21   Henderly, Britni A, PA-C  benztropine (COGENTIN) 2 MG tablet Take 1 tablet (2 mg total) by mouth 2 (two) times daily. 06/14/23   Lamar Sprinkles, MD  cetirizine (ZYRTEC ALLERGY) 10 MG tablet Take 1 tablet (10 mg total) by mouth daily. 11/24/19   Hall-Potvin, [place], PA-C  hyoscyamine (LEVSIN SL) 0.125 MG SL tablet Place 1 tablet (0.125 mg total) under the tongue in the morning and at bedtime. Patient not taking: Reported on 06/14/2023 11/28/20   Meredith Pel, NP  levonorgestrel (MIRENA) 20 MCG/24HR IUD 1 Intra Uterine Device (1 each total) by Intrauterine route once. 09/28/16 03/11/23  Georges Mouse, NP  lubiprostone (AMITIZA) 8 MCG capsule Take 1 capsule (8 mcg total) by mouth 2 (two) times daily with a meal. Please call [phone] to schedule an office visit for more refills 08/20/23   Unk Lightning, PA  naproxen (NAPROSYN)  500 MG tablet Take 1 tablet (500 mg total) by mouth 2 (two) times daily. Patient not taking: Reported on 06/14/2023 11/22/21   Henderly, Britni A, PA-C  OLANZapine zydis (ZYPREXA) 20 MG disintegrating tablet Take 1 tablet (20 mg total) by mouth at bedtime. 10/11/23   Lamar Sprinkles, MD  polyethylene glycol powder (GLYCOLAX/MIRALAX) 17 GM/SCOOP powder Take 17 g by mouth daily as needed. Patient not taking: Reported on 06/14/2023 03/11/23   Oletta Darter, MD      Allergies    Lactose intolerance (gi)    Review of Systems   Review of Systems  Constitutional:  Positive for fever. Negative for chills.  HENT:  Positive for congestion, sore throat and voice change. Negative for ear pain.   Eyes:  Negative for pain and visual disturbance.  Respiratory:  Positive for cough. Negative for shortness of breath and wheezing.   Cardiovascular:  Negative for chest pain and palpitations.  Gastrointestinal:  Negative for abdominal pain and vomiting.  Genitourinary:  Negative for dysuria and hematuria.  Musculoskeletal:  Positive for myalgias. Negative for arthralgias, back pain and neck stiffness.  Skin:  Negative for color change and rash.  Neurological:  Negative for seizures and syncope.  All other systems reviewed and are negative.   Physical Exam Updated Vital Signs BP 133/85   Pulse 97   Temp 97.9 F (36.6 C) (Oral)   Resp 18   Ht 1.499 m (4\' 11" )   Wt 86.6 kg   SpO2  97%   BMI 38.58 kg/m  Physical Exam Vitals and nursing note reviewed.  Constitutional:      General: She is not in acute distress.    Appearance: Normal appearance. She is well-developed. She is not ill-appearing.  HENT:     Head: Normocephalic and atraumatic.     Mouth/Throat:     Mouth: Mucous membranes are moist.     Pharynx: Oropharynx is clear. No oropharyngeal exudate or posterior oropharyngeal erythema.  Eyes:     Conjunctiva/sclera: Conjunctivae normal.  Cardiovascular:     Rate and Rhythm: Normal rate and  regular rhythm.     Heart sounds: No murmur heard. Pulmonary:     Effort: Pulmonary effort is normal. No respiratory distress.     Breath sounds: Normal breath sounds. No stridor. No wheezing, rhonchi or rales.  Abdominal:     Palpations: Abdomen is soft.     Tenderness: There is no abdominal tenderness.  Musculoskeletal:        General: No swelling.     Cervical back: Normal range of motion and neck supple. No rigidity.  Skin:    General: Skin is warm and dry.     Capillary Refill: Capillary refill takes less than 2 seconds.  Neurological:     General: No focal deficit present.     Mental Status: She is alert and oriented to person, place, and time.  Psychiatric:        Mood and Affect: Mood normal.     ED Results / Procedures / Treatments   Labs (all labs ordered are listed, but only abnormal results are displayed) Labs Reviewed  RESP PANEL BY RT-PCR (RSV, FLU A&B, COVID)  [id number]    EKG None  Radiology No results found.  Procedures Procedures    Medications Ordered in ED Medications - No data to display  ED Course/ Medical Decision Making/ A&P                                 Medical Decision Making  Patient nontoxic no acute distress.  Clinically no fevers here.  Lungs very clear.  Not suspicious for pneumonia.  Nursing had ordered a respiratory panel.  Patient is had symptoms for a week so even if COVID positive out of the window for antiviral.  Respiratory panel negative.  Will recommend symptomatic treatment.   Final Clinical Impression(s) / ED Diagnoses Final diagnoses:  Influenza-like illness    Rx / DC Orders ED Discharge Orders     None         Vanetta Mulders, MD 10/17/23 1610    Vanetta Mulders, MD 10/17/23 [id number]

## 2023-10-17 NOTE — ED Triage Notes (Signed)
Pt presents with complaints of productive cough, fever, chills, congestion X 1 week.

## 2023-10-17 NOTE — ED Notes (Signed)
Pt alert and oriented X 4 at the time of discharge. RR even and unlabored. No acute distress noted. Pt verbalized understanding of discharge instructions as discussed. Pt ambulatory to lobby at time of discharge.

## 2023-10-17 NOTE — Discharge Instructions (Signed)
[place] panel negative.  Would recommend extra strength Tylenol and Mucinex DM 12-hour tablets for your symptoms.  Return for any new or worse symptoms.

## 2023-10-21 ENCOUNTER — Ambulatory Visit: Payer: Federal, State, Local not specified - PPO | Admitting: Student

## 2023-10-25 ENCOUNTER — Telehealth: Payer: Self-pay | Admitting: Physician Assistant

## 2023-10-25 NOTE — Telephone Encounter (Signed)
Informed patient she is due for a follow up visit as it has been over a year since her last visit. Patient states Amitiza is not helpful and Linzess was helpful but was asking for a lower dose of Linzess. Please advise.

## 2023-10-25 NOTE — Telephone Encounter (Signed)
Patient called and stated that she was ogrinally on Linzess and then she was switched to Lubiprostone 8 MCG. Patient also stated that she was wondering if she could actually go back on Linzess due to her not seeing any results being on Lubiprostone. Patient as well stated if possible she would like the medication [facility] to [facility] on [address]. Please advise.

## 2023-10-28 ENCOUNTER — Ambulatory Visit (HOSPITAL_COMMUNITY): Payer: Federal, State, Local not specified - PPO | Admitting: Licensed Clinical Social Worker

## 2023-10-28 ENCOUNTER — Telehealth (HOSPITAL_COMMUNITY): Payer: Self-pay

## 2023-10-28 MED ORDER — LINACLOTIDE 72 MCG PO CAPS: 72.0000 ug | ORAL_CAPSULE | Freq: Every day | ORAL | 5 refills | Status: DC

## 2023-10-28 NOTE — Telephone Encounter (Signed)
Pharmacy Patient Advocate Encounter   Received notification from CoverMyMeds that prior authorization for Linzess 72 mcg capsule is required/requested.   Insurance verification completed.   The patient is insured through [facility] .   Per test claim: PA required; PA submitted to above mentioned insurance via CoverMyMeds Key/confirmation #/EOC [id number] Status is pending

## 2023-10-28 NOTE — Telephone Encounter (Signed)
Patient is requesting a call to discuss Linzess and have  updated prescription sent to pharmacy. Please advise.

## 2023-10-28 NOTE — Telephone Encounter (Signed)
Patient informed script sent to pharmacy.

## 2023-11-11 NOTE — Telephone Encounter (Signed)
 Please update patient

## 2023-11-11 NOTE — Telephone Encounter (Signed)
 Please message patient with an update on her prior authorization.

## 2023-11-15 ENCOUNTER — Other Ambulatory Visit (HOSPITAL_COMMUNITY): Payer: Self-pay

## 2023-11-15 NOTE — Telephone Encounter (Signed)
 Pharmacy Patient Advocate Encounter  Received notification from [facility] that Prior Authorization for LINZESS  has been APPROVED from 12.6.24 to 1.6.26. Ran test claim, Copay is $35. This test claim was processed through [facility]- copay amounts may vary at other pharmacies due to pharmacy/plan contracts, or as the patient moves through the different stages of their insurance plan.

## 2023-11-16 NOTE — Telephone Encounter (Signed)
 Noted, patient notified via MyChart.

## 2023-11-25 ENCOUNTER — Encounter (HOSPITAL_COMMUNITY): Payer: Self-pay | Admitting: Licensed Clinical Social Worker

## 2023-11-25 ENCOUNTER — Ambulatory Visit (HOSPITAL_COMMUNITY): Payer: Federal, State, Local not specified - PPO | Admitting: Licensed Clinical Social Worker

## 2023-11-25 DIAGNOSIS — F201 Disorganized schizophrenia: Secondary | ICD-10-CM | POA: Diagnosis not present

## 2023-11-25 NOTE — Progress Notes (Signed)
Virtual Visit via Video Note  I connected with Natalie Wagner on 11/25/23 at 11:00 AM EST by a video enabled telemedicine application and verified that I am speaking with the correct person using two identifiers.  Location: Patient: home Provider: home office   I discussed the limitations of evaluation and management by telemedicine and the availability of in person appointments. The patient expressed understanding and agreed to proceed.   I discussed the assessment and treatment plan with the patient. The patient was provided an opportunity to ask questions and all were answered. The patient agreed with the plan and demonstrated an understanding of the instructions.   The patient was advised to call back or seek an in-person evaluation if the symptoms worsen or if the condition fails to improve as anticipated.  I provided 55 minutes of non-face-to-face time during this encounter.   Natalie Melter, LCSW   THERAPIST PROGRESS NOTE  Session Time: 11:00am-11:55am  Participation Level: Active  Behavioral Response: NeatAlertEuthymic  Type of Therapy: Individual Therapy  Treatment Goals addressed:  I want to work on my anxiety, depression, self-confidence, and my ability to socialize". Natalie Wagner will report reduced depression and anxiety 5 out of 7 days.    ProgressTowards Goals: Progressing  Interventions: CBT  Summary: Natalie Wagner is a 28 y.o. female who presents with Schizophrenia, disorganized type.   Suicidal/Homicidal: Nowithout intent/plan  Therapist Response: Natalie Wagner engaged well in individual virtual session with Facilities manager.  Clinician utilized CBT to process thoughts feelings and interactions.  Clinician explored activities, health, and family.  Natalie Wagner shared that mood has been stable.  She reports spending a lot of time with her family over the holidays, including her new baby niece.  Clinician processed interactions with baby and noted some anxiety and  stress over changing diapers, filling bottles, and entertaining the baby.  Clinician discussed physical health issues including IBS.  Natalie Wagner shared she has been working very hard on her cooking and eating healthy in order to help her stomach feel better.  Plan: Return again in 4 weeks.  Diagnosis: Schizophrenia, disorganized type (HCC)  Collaboration of Care: Patient refused AEB none required  Patient/Guardian was advised Release of Information must be obtained prior to any record release in order to collaborate their care with an outside provider. Patient/Guardian was advised if they have not already done so to contact the registration department to sign all necessary forms in order for [place] to release information regarding their care.   Consent: Patient/Guardian gives verbal consent for treatment and assignment of benefits for services provided during this visit. Patient/Guardian expressed understanding and agreed to proceed.   Natalie Wagner [place], LCSW 11/25/2023

## 2023-12-30 ENCOUNTER — Ambulatory Visit (HOSPITAL_COMMUNITY): Payer: Federal, State, Local not specified - PPO | Admitting: Licensed Clinical Social Worker

## 2024-01-05 ENCOUNTER — Ambulatory Visit (INDEPENDENT_AMBULATORY_CARE_PROVIDER_SITE_OTHER): Payer: Federal, State, Local not specified - PPO | Admitting: Physician Assistant

## 2024-01-05 ENCOUNTER — Encounter: Payer: Self-pay | Admitting: Physician Assistant

## 2024-01-05 ENCOUNTER — Telehealth: Payer: Self-pay

## 2024-01-05 VITALS — BP 118/76 | HR 91 | Ht 59.0 in | Wt 190.0 lb

## 2024-01-05 DIAGNOSIS — K581 Irritable bowel syndrome with constipation: Secondary | ICD-10-CM

## 2024-01-05 MED ORDER — LUBIPROSTONE 8 MCG PO CAPS: 8.0000 ug | ORAL_CAPSULE | Freq: Two times a day (BID) | ORAL | 3 refills | Status: DC

## 2024-01-05 NOTE — Progress Notes (Signed)
 Chief Complaint: Follow-up IBS-C  HPI:    Natalie Wagner is a 28 year old African-American female, known to Dr. Lavon Paganini, who returns to clinic today for follow-up of IBS-C.    11/28/2020 patient seen in clinic by Willette Cluster at that time discussed IBS and chronic constipation. Noted that MiraLAX caused loose stools and she was managing constipation with increased fruit and water. Her Levsin was increased to twice daily dosing as needed. At that time discussed possibly trying Amitiza or Linzess if she did not tolerate MiraLAX in the past for constipation continued.     06/04/2022 patient describes suffering with constipation for 3 years.  Had tried various over-the-counter products as well as MiraLAX which did not work for her.  At that point prescribed Linzess under 45 mcg daily.    10/25/2023 patient called and described being on Amitiza 8 mcg daily which was not working for her as well as the Linzess.  But she wanted to be on a lower dose of Linzess.  We sending in 72 mcg daily.    12/13/2023 patient called and described that she stopped taking the Linzess because it was harder to use and went back to Amitiza.    Today patient presents clinic and tells me she is doing pretty well with the Amitiza 8 mcg twice a day.  Apparently tried the Linzess but it was too strong for her and caused watery diarrhea and some cramping.  She is very happy that she is mostly able to get a bowel movement every day that is soft and solid.  She does focus on trying to drink at least 64 ounces of water and has also started a fiber supplement.    Denies fever, chills or weight loss.  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 6.7 g 0   benztropine (COGENTIN) 2 MG tablet Take 1 tablet (2 mg total) by mouth 2 (two) times daily. 180 tablet 0   cetirizine (ZYRTEC ALLERGY) 10 MG tablet Take 1 tablet (10 mg total) by mouth daily. 30 tablet 0   hyoscyamine (LEVSIN SL) 0.125 MG SL tablet Place 1 tablet (0.125 mg total) under the tongue in the morning and at bedtime. (Patient not taking: Reported on 06/14/2023) 180 tablet 4   levonorgestrel (MIRENA)  20 MCG/24HR IUD 1 Intra Uterine Device (1 each total) by Intrauterine route once. 1 each 0   linaclotide (LINZESS) 72 MCG capsule Take 1 capsule (72 mcg total) by mouth daily before breakfast. 30 capsule 5   lubiprostone (AMITIZA) 8 MCG capsule Take 1 capsule (8 mcg total) by mouth 2 (two) times daily with a meal. Please call [phone] to schedule an office visit for more refills 60 capsule 3   naproxen (NAPROSYN) 500 MG tablet Take 1 tablet (500 mg total) by mouth 2 (two) times daily. (Patient not taking: Reported on 06/14/2023) 30 tablet 0   OLANZapine zydis (ZYPREXA) 20 MG disintegrating tablet Take 1 tablet (20 mg total) by mouth at bedtime. 90 tablet 1   polyethylene glycol powder (GLYCOLAX/MIRALAX) 17  GM/SCOOP powder Take 17 g by mouth daily as needed. (Patient not taking: Reported on 06/14/2023) 850 g 2   No current facility-administered medications for this visit.    Allergies as of 01/05/2024 - Review Complete 11/25/2023  Allergen Reaction Noted   Lactose intolerance (gi) Diarrhea 05/22/2014    Family History  Problem Relation Age of Onset   Diabetes Maternal Grandfather    Hypertension Maternal Grandfather    Heart disease Maternal Grandfather    Kidney disease Maternal Grandfather    Anesthesia problems Maternal Grandfather        hx. of being hard to wake up post-op   Hypertension Maternal Grandmother    Stroke Maternal Grandmother    Asthma Mother    Hypertension Maternal Uncle    Depression Sister    Cancer Other     Social History   Socioeconomic History   Marital status: Single    Spouse name: Not on file   Number of children: Not on file   Years of education: Not on file   Highest education level: Not on file  Occupational History   Not on file  Tobacco Use   Smoking status: Never   Smokeless tobacco: Never  Vaping Use   Vaping status: Never Used  Substance and Sexual Activity   Alcohol use: No    Alcohol/week: 0.0 standard drinks of alcohol   Drug use: No   Sexual activity: Never    Birth control/protection: I.U.D.  Other Topics Concern   Not on file  Social History Narrative   Not on file   Social Drivers of Health   Financial Resource Strain: Not on file  Food Insecurity: Not on file  Transportation Needs: Not on file  Physical Activity: Not on file  Stress: Not on file  Social Connections: Not on file  Intimate Partner Violence: Not on file    Review of Systems:    Constitutional: No weight loss, fever or chills Cardiovascular: No chest pain Respiratory: No SOB  Gastrointestinal: See HPI and otherwise negative   Physical Exam:  Vital signs: BP 118/76   Pulse 91   Ht 4\' 11"  (1.499 m)   Wt 190 lb (86.2 kg)   BMI 38.38 kg/m     Constitutional:   Pleasant overweight AA female appears to be in NAD, Well developed, Well nourished, alert and cooperative Respiratory: Respirations even and unlabored. Lungs clear to auscultation bilaterally.   No wheezes, crackles, or rhonchi.  Cardiovascular: Normal S1, S2. No MRG. Regular rate and rhythm. No peripheral edema, cyanosis or pallor.  Gastrointestinal:  Soft, nondistended, nontender. No rebound or guarding. Normal bowel sounds. No appreciable masses or hepatomegaly. Rectal:  Not performed.  Psychiatric: Demonstrates good judgement and reason  without abnormal affect or behaviors.  RELEVANT LABS AND IMAGING: CBC    Component Value Date/Time   WBC 8.1 06/30/2023 1115   WBC 6.7 11/21/2016 1029   RBC 3.98 06/30/2023 1115   RBC 4.12 11/21/2016 1029   HGB 11.7 06/30/2023 1115   HCT 36.4 06/30/2023 1115   PLT 329 06/30/2023 1115   MCV 92 06/30/2023 1115   MCH 29.4 06/30/2023 1115   MCH 30.6 11/21/2016 1029   MCHC 32.1 06/30/2023 1115   MCHC 32.7 11/21/2016 1029   RDW 11.7 06/30/2023 1115   LYMPHSABS 2.5 06/30/2023 1115   MONOABS 0.5 07/08/2015 1148   EOSABS 0.0 06/30/2023 1115   BASOSABS 0.0 06/30/2023 1115    CMP     Component Value Date/Time   NA 141 06/30/2023 1115   K 4.8 06/30/2023 1115   CL 100 06/30/2023 1115   CO2 25 06/30/2023 1115   GLUCOSE 88 06/30/2023 1115   GLUCOSE 90 03/02/2017 1413   BUN 10 06/30/2023 1115   CREATININE 0.84 06/30/2023 1115   CREATININE 0.88 03/02/2017 1413   CALCIUM 9.6 06/30/2023 1115   PROT 7.1 06/30/2023 1115   ALBUMIN 4.5 06/30/2023 1115   AST 20 06/30/2023 1115   ALT 16 06/30/2023 1115   ALKPHOS 104 06/30/2023 1115   BILITOT 0.3 06/30/2023 1115   GFRNONAA 92 04/01/2020 1149   GFRNONAA >89 05/03/2013 1531   GFRAA 106 04/01/2020 1149   GFRAA >89 05/03/2013 1531    Assessment: 1.  IBS-C: Improved on Amitiza 8 mcg twice daily  Plan: 1.  Continue Amitiza 8 mcg twice daily, prescribed #120 with 3 refills 2.   Patient to follow in clinic in a year or sooner if necessary.  Hyacinth Meeker, PA-C [facility] 01/05/2024, 9:44 AM  Cc: Nelia Shi, MD

## 2024-01-05 NOTE — Patient Instructions (Addendum)
 _______________________________________________________  If your blood pressure at your visit was 140/90 or greater, please contact your primary care physician to follow up on this.  If you are age 28 or younger, your body mass index should be between 19-25. Your Body mass index is 38.38 kg/m. If this is out of the aformentioned range listed, please consider follow up with your Primary Care Provider.  ________________________________________________________  The [facility] providers would like to encourage you to use [facility] to communicate with providers for non-urgent requests or questions.  Due to long hold times on the telephone, sending your provider a message by [facility] may be a faster and more efficient way to get a response.  Please allow 48 business hours for a response.  Please remember that this is for non-urgent requests.  _______________________________________________________  We have sent the following medications to your pharmacy for you to pick up at your convenience:  Amitiza  CONTINUE Metamucil and water daily  You will need a follow up in 1 year.  We will contact you to schedule this appointment.   Thank you for entrusting me with your care and choosing [facility].  Hyacinth Meeker, PA-C

## 2024-01-05 NOTE — Telephone Encounter (Signed)
 Pharmacy Patient Advocate Encounter   Received notification from CoverMyMeds that prior authorization for  Lubiprostone capsules is required/requested.   Insurance verification completed.   The patient is insured through [facility] .   Per test claim: PA required; PA started via CoverMyMeds. KEY [id number] . Waiting for clinical questions to populate.

## 2024-01-11 ENCOUNTER — Ambulatory Visit (HOSPITAL_BASED_OUTPATIENT_CLINIC_OR_DEPARTMENT_OTHER): Payer: Federal, State, Local not specified - PPO

## 2024-01-11 DIAGNOSIS — Z79899 Other long term (current) drug therapy: Secondary | ICD-10-CM | POA: Diagnosis not present

## 2024-01-11 DIAGNOSIS — F330 Major depressive disorder, recurrent, mild: Secondary | ICD-10-CM | POA: Diagnosis not present

## 2024-01-11 DIAGNOSIS — F201 Disorganized schizophrenia: Secondary | ICD-10-CM | POA: Diagnosis not present

## 2024-01-11 NOTE — Progress Notes (Signed)
 Patient arrived today with her mother for her due labs. Labs were drawn from patients Left AC, patient tolerated well and without complaint.

## 2024-01-12 LAB — TSH: TSH: 1.84 u[IU]/mL (ref 0.450–4.500)

## 2024-01-12 LAB — COMPREHENSIVE METABOLIC PANEL
ALT: 19 IU/L (ref 0–32)
AST: 18 IU/L (ref 0–40)
Albumin: 4.4 g/dL (ref 4.0–5.0)
Alkaline Phosphatase: 110 IU/L (ref 44–121)
BUN/Creatinine Ratio: 12 (ref 9–23)
BUN: 11 mg/dL (ref 6–20)
Bilirubin Total: 0.2 mg/dL (ref 0.0–1.2)
CO2: 22 mmol/L (ref 20–29)
Calcium: 9.4 mg/dL (ref 8.7–10.2)
Chloride: 104 mmol/L (ref 96–106)
Creatinine, Ser: 0.94 mg/dL (ref 0.57–1.00)
Globulin, Total: 2.8 g/dL (ref 1.5–4.5)
Glucose: 103 mg/dL — ABNORMAL HIGH (ref 70–99)
Potassium: 4.4 mmol/L (ref 3.5–5.2)
Sodium: 141 mmol/L (ref 134–144)
Total Protein: 7.2 g/dL (ref 6.0–8.5)
eGFR: 85 mL/min/{1.73_m2} (ref >59)

## 2024-01-12 LAB — HEMOGLOBIN A1C
Est. average glucose Bld gHb Est-mCnc: 123 mg/dL
Hgb A1c MFr Bld: 5.9 % — ABNORMAL HIGH (ref 4.8–5.6)

## 2024-01-12 LAB — CBC WITH DIFFERENTIAL/PLATELET
Basophils Absolute: 0 10*3/uL (ref 0.0–0.2)
Basos: 0 %
EOS (ABSOLUTE): 0 10*3/uL (ref 0.0–0.4)
Eos: 0 %
Hematocrit: 37 % (ref 34.0–46.6)
Hemoglobin: 12 g/dL (ref 11.1–15.9)
Immature Grans (Abs): 0 10*3/uL (ref 0.0–0.1)
Immature Granulocytes: 0 %
Lymphocytes Absolute: 2.4 10*3/uL (ref 0.7–3.1)
Lymphs: 30 %
MCH: 30 pg (ref 26.6–33.0)
MCHC: 32.4 g/dL (ref 31.5–35.7)
MCV: 93 fL (ref 79–97)
Monocytes Absolute: 0.6 10*3/uL (ref 0.1–0.9)
Monocytes: 7 %
Neutrophils Absolute: 5.1 10*3/uL (ref 1.4–7.0)
Neutrophils: 63 %
Platelets: 389 10*3/uL (ref 150–450)
RBC: 4 x10E6/uL (ref 3.77–5.28)
RDW: 11.7 % (ref 11.7–15.4)
WBC: 8.1 10*3/uL (ref 3.4–10.8)

## 2024-01-12 LAB — LIPID PANEL
Chol/HDL Ratio: 4.8 ratio — ABNORMAL HIGH (ref 0.0–4.4)
Cholesterol, Total: 210 mg/dL — ABNORMAL HIGH (ref 100–199)
HDL: 44 mg/dL (ref >39)
LDL Chol Calc (NIH): 151 mg/dL — ABNORMAL HIGH (ref 0–99)
Triglycerides: 82 mg/dL (ref 0–149)
VLDL Cholesterol Cal: 15 mg/dL (ref 5–40)

## 2024-01-12 LAB — PROLACTIN: Prolactin: 66.1 ng/mL — ABNORMAL HIGH (ref 4.8–33.4)

## 2024-01-17 ENCOUNTER — Telehealth (HOSPITAL_COMMUNITY): Payer: Federal, State, Local not specified - PPO | Admitting: Student

## 2024-01-24 ENCOUNTER — Telehealth (HOSPITAL_BASED_OUTPATIENT_CLINIC_OR_DEPARTMENT_OTHER): Admitting: Student

## 2024-01-24 DIAGNOSIS — F330 Major depressive disorder, recurrent, mild: Secondary | ICD-10-CM | POA: Diagnosis not present

## 2024-01-24 DIAGNOSIS — Z79899 Other long term (current) drug therapy: Secondary | ICD-10-CM | POA: Diagnosis not present

## 2024-01-24 DIAGNOSIS — F201 Disorganized schizophrenia: Secondary | ICD-10-CM | POA: Diagnosis not present

## 2024-01-24 DIAGNOSIS — Z0189 Encounter for other specified special examinations: Secondary | ICD-10-CM | POA: Diagnosis not present
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
## 2024-01-26 ENCOUNTER — Encounter (HOSPITAL_COMMUNITY): Payer: Self-pay | Admitting: Student

## 2024-01-26 DIAGNOSIS — Z0189 Encounter for other specified special examinations: Secondary | ICD-10-CM | POA: Insufficient documentation

## 2024-01-26 MED ORDER — BENZTROPINE MESYLATE 2 MG PO TABS: 2.0000 mg | ORAL_TABLET | Freq: Two times a day (BID) | ORAL | 0 refills | Status: DC

## 2024-01-26 MED ORDER — METFORMIN HCL 500 MG PO TABS: 500.0000 mg | ORAL_TABLET | Freq: Every day | ORAL | 1 refills | Status: DC

## 2024-01-26 MED ORDER — OLANZAPINE 20 MG PO TBDP: 20.0000 mg | ORAL_TABLET | Freq: Every day | ORAL | 1 refills | Status: DC

## 2024-01-26 NOTE — Addendum Note (Signed)
 Addended by: Everlena Cooper on: 01/26/2024 01:06 PM   Modules accepted: Level of Service

## 2024-01-27 ENCOUNTER — Ambulatory Visit (INDEPENDENT_AMBULATORY_CARE_PROVIDER_SITE_OTHER): Payer: Federal, State, Local not specified - PPO | Admitting: Licensed Clinical Social Worker

## 2024-01-27 DIAGNOSIS — F201 Disorganized schizophrenia: Secondary | ICD-10-CM

## 2024-01-28 ENCOUNTER — Other Ambulatory Visit (HOSPITAL_COMMUNITY): Payer: Self-pay

## 2024-01-29 ENCOUNTER — Encounter (HOSPITAL_COMMUNITY): Payer: Self-pay | Admitting: Licensed Clinical Social Worker

## 2024-01-29 NOTE — Progress Notes (Signed)
 Virtual Visit via Video Note  I connected with [name] on 01/27/24 at 11:00 AM EDT by a video enabled telemedicine application and verified that I am speaking with the correct person using two identifiers.  Location: Patient: home Provider: home office   I discussed the limitations of evaluation and management by telemedicine and the availability of in person appointments. The patient expressed understanding and agreed to proceed.   I discussed the assessment and treatment plan with the patient. The patient was provided an opportunity to ask questions and all were answered. The patient agreed with the plan and demonstrated an understanding of the instructions.   The patient was advised to call back or seek an in-person evaluation if the symptoms worsen or if the condition fails to improve as anticipated.  I provided 45 minutes of non-face-to-face time during this encounter.   [name], LCSW   THERAPIST PROGRESS NOTE  Session Time: 11:00am-11:45am  Participation Level: Active  Behavioral Response: NeatAlertEuthymic  Type of Therapy: Individual Therapy  Treatment Goals addressed:  I want to work on my anxiety, depression, self-confidence, and my ability to socialize". Fendi will report reduced depression and anxiety 5 out of 7 days.      ProgressTowards Goals: Progressing  Interventions: CBT  Summary: NAQUITA NAPPIER is a 28 y.o. female who presents with Schizophrenia, disorganized type.   Suicidal/Homicidal: Nowithout intent/plan  Therapist Response: Rayel engaged well in individual virtual session with Facilities manager. Clinician utilized CBT to process thoughts, feelings, and interactions. Clinician processed coping skills and ADLs. Clinician discussed health concerns, particularly blood test results. Clinician explored changes Marlisa is willing to make. Clinician also identified the importance of maintaining good relationships with family.   Plan:  Return again in 4 weeks.  Diagnosis: Schizophrenia, disorganized type (HCC)  Collaboration of Care: Psychiatrist AEB reviewed notes from Dr. Alfonse Flavors  Patient/Guardian was advised Release of Information must be obtained prior to any record release in order to collaborate their care with an outside provider. Patient/Guardian was advised if they have not already done so to contact the registration department to sign all necessary forms in order for [place] to release information regarding their care.   Consent: Patient/Guardian gives verbal consent for treatment and assignment of benefits for services provided during this visit. Patient/Guardian expressed understanding and agreed to proceed.   [name] Linwood, LCSW 01/29/2024

## 2024-02-24 ENCOUNTER — Ambulatory Visit (HOSPITAL_COMMUNITY): Admitting: Licensed Clinical Social Worker

## 2024-03-07 MED ORDER — LINACLOTIDE 72 MCG PO CAPS: 72.0000 ug | ORAL_CAPSULE | Freq: Every day | ORAL | 6 refills | Status: DC

## 2024-03-07 NOTE — Telephone Encounter (Signed)
 [facility] see the notes from [facility] about [facility] 

## 2024-03-07 NOTE — Addendum Note (Signed)
 Addended by: Aneita Keens on: 03/07/2024 04:32 PM   Modules accepted: Orders

## 2024-03-15 ENCOUNTER — Telehealth (HOSPITAL_COMMUNITY): Admitting: Student

## 2024-03-15 DIAGNOSIS — F201 Disorganized schizophrenia: Secondary | ICD-10-CM | POA: Diagnosis not present

## 2024-03-15 DIAGNOSIS — F330 Major depressive disorder, recurrent, mild: Secondary | ICD-10-CM

## 2024-03-15 DIAGNOSIS — Z0189 Encounter for other specified special examinations: Secondary | ICD-10-CM

## 2024-03-15 DIAGNOSIS — Z79899 Other long term (current) drug therapy: Secondary | ICD-10-CM

## 2024-03-15 MED ORDER — METFORMIN HCL 500 MG PO TABS
500.0000 mg | ORAL_TABLET | Freq: Every day | ORAL | 1 refills | Status: AC
Start: 2024-03-15 — End: 2024-05-14
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
## 2024-03-20 ENCOUNTER — Encounter (HOSPITAL_COMMUNITY): Payer: Self-pay | Admitting: Student

## 2024-03-20 NOTE — Addendum Note (Signed)
 Addended by: Donnelly Gainer on: 03/20/2024 11:00 AM   Modules accepted: Level of Service

## 2024-03-23 ENCOUNTER — Ambulatory Visit (HOSPITAL_COMMUNITY): Admitting: Licensed Clinical Social Worker

## 2024-04-13 ENCOUNTER — Ambulatory Visit (HOSPITAL_COMMUNITY): Admitting: Licensed Clinical Social Worker

## 2024-04-26 ENCOUNTER — Encounter (HOSPITAL_COMMUNITY): Payer: Self-pay | Admitting: Student

## 2024-04-26 ENCOUNTER — Telehealth (HOSPITAL_BASED_OUTPATIENT_CLINIC_OR_DEPARTMENT_OTHER): Admitting: Student

## 2024-04-26 DIAGNOSIS — Z79899 Other long term (current) drug therapy: Secondary | ICD-10-CM | POA: Diagnosis not present

## 2024-04-26 DIAGNOSIS — F330 Major depressive disorder, recurrent, mild: Secondary | ICD-10-CM | POA: Diagnosis not present

## 2024-04-26 DIAGNOSIS — F201 Disorganized schizophrenia: Secondary | ICD-10-CM

## 2024-04-26 DIAGNOSIS — Z0189 Encounter for other specified special examinations: Secondary | ICD-10-CM

## 2024-04-26 MED ORDER — METFORMIN HCL 500 MG PO TABS: 500.0000 mg | ORAL_TABLET | Freq: Every day | ORAL | 2 refills | Status: DC

## 2024-04-26 MED ORDER — BENZTROPINE MESYLATE 2 MG PO TABS: 2.0000 mg | ORAL_TABLET | Freq: Two times a day (BID) | ORAL | 0 refills | Status: DC

## 2024-04-26 MED ORDER — OLANZAPINE 20 MG PO TBDP: 20.0000 mg | ORAL_TABLET | Freq: Every day | ORAL | 0 refills | Status: DC
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
## 2024-04-27 NOTE — Addendum Note (Signed)
 Addended by: Donnelly Gainer on: 04/27/2024 08:29 AM   Modules accepted: Level of Service

## 2024-05-31 ENCOUNTER — Ambulatory Visit (HOSPITAL_COMMUNITY): Admitting: Family

## 2024-06-01 ENCOUNTER — Ambulatory Visit (INDEPENDENT_AMBULATORY_CARE_PROVIDER_SITE_OTHER): Admitting: Licensed Clinical Social Worker

## 2024-06-01 DIAGNOSIS — F201 Disorganized schizophrenia: Secondary | ICD-10-CM

## 2024-06-03 ENCOUNTER — Encounter (HOSPITAL_COMMUNITY): Payer: Self-pay | Admitting: Licensed Clinical Social Worker

## 2024-06-03 NOTE — Progress Notes (Signed)
 Virtual Visit via Video Note  I connected with Natalie Wagner on 06/01/24 at 10:00 AM EDT by a video enabled telemedicine application and verified that I am speaking with the correct person using two identifiers.  Location: Patient: home Provider: home office   I discussed the limitations of evaluation and management by telemedicine and the availability of in person appointments. The patient expressed understanding and agreed to proceed.   I discussed the assessment and treatment plan with the patient. The patient was provided an opportunity to ask questions and all were answered. The patient agreed with the plan and demonstrated an understanding of the instructions.   The patient was advised to call back or seek an in-person evaluation if the symptoms worsen or if the condition fails to improve as anticipated.  I provided 45 minutes of non-face-to-face time during this encounter.   Natalie JONELLE Rosser, LCSW   THERAPIST PROGRESS NOTE  Session Time: 10:00am-10:45am  Participation Level: Active  Behavioral Response: NeatAlertAnxious and Euthymic  Type of Therapy: Individual Therapy  Treatment Goals addressed:   I want to work on my anxiety, depression, self-confidence, and my ability to socialize. Natalie Wagner will report reduced depression and anxiety 5 out of 7 days.      ProgressTowards Goals: Progressing  Interventions: CBT  Summary: Natalie Wagner is a 28 y.o. female who presents with Schizophrenia, disorganized type.   Suicidal/Homicidal: Nowithout intent/plan  Therapist Response: Sareena engaged well in individual virtual session with Facilities manager. Clinician utilized CBT to process thoughts, feelings, and interactions at home and in community. Clinician explored mood and identified some ongoing anxiety, worry, and some slight depression. Clinician explored triggers to anxiety and noted that the same issues have been present for a long time, including worry about failure,  lower self esteem, and challenges with self-starting. Clinician explored Natalie Wagner's wishes and hopes for school. Clinician identified that long term learning challenges will continue posing challenges and threat to completing tasks and assignments. Clinician explored readiness to try something different in order to find some expertise in the area.   Plan: Return again in 2-4 weeks.  Diagnosis: Schizophrenia, disorganized type (HCC)  Collaboration of Care: Patient refused AEB none required at this session.   Patient/Guardian was advised Release of Information must be obtained prior to any record release in order to collaborate their care with an outside provider. Patient/Guardian was advised if they have not already done so to contact the registration department to sign all necessary forms in order for us  to release information regarding their care.   Consent: Patient/Guardian gives verbal consent for treatment and assignment of benefits for services provided during this visit. Patient/Guardian expressed understanding and agreed to proceed.   Natalie JONELLE DuPont, LCSW 06/03/2024

## 2024-06-05 NOTE — Progress Notes (Signed)
 BH MD Outpatient Progress Note  06/14/2024 11:32 AM  Natalie Wagner  MRN:  [id number]  Assessment:  Natalie Wagner presents for follow-up evaluation.  Patient is doing well today and feels her mood is well controlled with Zyprexa . She states that she was only taking Cogentin  at night. Her AIMS exam was normal and  how she describes her hand tremors seem to be anxiety induced vs EPS. We discussed risks and benefits regarding her staying on Cogentin  with the risks being her ongoing dry mouth and constipation which could be affected by the medication. I discussed decreasing Cogentin  to 1 mg nightly and closely monitoring if her tremor worsens. Patient is also experiencing constipation and diarrhea at times in which metformin  could also be affecting this. Her PCP prescribed medications to address the symptoms and we will also closely monitor. We will continue her Zyprexa  and Metformin  at this time. In future appointments, will have discussion if patient needs to be on 20 mg Zyprexa  or if we can decrease the dose and still keep her psychiatric symptoms controlled as Zyprexa  can worsen metabolic syndrome and she did have hx of galactorrhea. Reassuringly, she is working closely with her therapist and her family is supportive. F/u in about 1 month.  Identifying Information: Natalie Wagner is a 28 y.o. female with a history of schizophrenia, disorganized type, and MDD  who is an established patient with [facility] for follow-up for management of medications.   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    Subjective:  Chief Complaint:  Chief Complaint  Patient presents with   Medication Refill   Follow-up   Schizophrenia    Interval History:   Patient seen with pt's mother, medical student, and myself.  Patient reports feeling okay today. She state that she takes her medications every day. She does report some anxiety throughout the day regarding talking to people. She feels her medications are helping her stay happy. Her goals are to eat healthier and to exercise. She has been having watery stools on metformin . Patient reports the following adverse effects: constipation, dry mouth. She reports using the coping skills of tapping on her face.   Patient reports good sleep, reporting 8-9 hours of sleep at night. Patient reports good appetite, reporting eating a balanced diet of fruit, vegetable, rice, and meat.   Patient denies current SI, HI, and AVH.   Stressors include her brother bought a dog but he gave the dog up recently due to her fear of dogs.  Substance use: denies   Visit Diagnosis:    ICD-10-CM   1. 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 Assessment for weight gain due to psychotropic drugs  Z01.89 metFORMIN  (GLUCOPHAGE ) 500 MG tablet   Z79.899     3. MDD (major depressive disorder), recurrent episode, mild (HCC)  F33.0 OLANZapine   zydis (ZYPREXA ) 20 MG disintegrating tablet        Past Psychiatric History:  Diagnoses: Schizophrenia, disorganized type, MDD, ODD Medication trials: Zyprexa , Cogentin  (symptomatic with 1 mg BID), Abilify  (ineffective).  Previous psychiatrist/therapist: Dr. Brutus, Dr. Rainelle.  Currently sees Harlene Rosser LCSW for therapy Hospitalizations: 2017-  [facility] Suicide attempts: Yes, per previous documentation SIB: Denies Hx of violence towards others: Yes, which prompted first psychiatric hospitalization Current access to guns: Denies Hx of trauma/abuse: Denies Substance use: Denies  Past Medical History:  Past Medical History:  Diagnosis Date   Asthma    prn inhaler   Constipation 05/05/2013   Difficulty swallowing pills    Eating disorder 07/27/2016   Elevated prolactin level 12/27/2014   Galactorrhea 12/27/2014   History of seizures as a child    mother states were triggered by migraines; no seizures in > 7 yr.   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 of being hard to wake up post-op   Hypertension Maternal Grandmother    Stroke Maternal Grandmother    Asthma Mother    Hypertension Maternal Uncle    Depression Sister    Cancer Other     Social History:  Living: living with mom and brother who is going to [employer] Support: mom and brother  Social History   Socioeconomic History   Marital status: Single    Spouse name: Not on file   Number of children: Not on file   Years of education: Not on file   Highest  education level: Not on file  Occupational History   Not on file  Tobacco Use   Smoking status: Never   Smokeless tobacco: Never  Vaping Use   Vaping status: Never Used  Substance and Sexual Activity   Alcohol use: No    Alcohol/week: 0.0 standard drinks of alcohol   Drug use: No   Sexual activity: Never    Birth control/protection: I.U.D.  Other Topics Concern   Not on file  Social History Narrative   Not on file   Social Drivers of Health   Financial Resource Strain: Not on file  Food Insecurity: Not on file  Transportation Needs: Not on file  Physical Activity: Not on file  Stress: Not on file  Social Connections: Not on file    Allergies:  Allergies  Allergen Reactions   Lactose Intolerance (Gi) Diarrhea    Current Medications: Current Outpatient Medications  Medication Sig Dispense Refill   benztropine  (COGENTIN ) 1 MG tablet Take 1 mg by mouth at bedtime.  albuterol  (VENTOLIN  HFA) 108 (90 Base) MCG/ACT inhaler Inhale 1-2 puffs into the lungs every 6 (six) hours as needed for wheezing or shortness of breath. 6.7 g 0   cetirizine  (ZYRTEC  ALLERGY) 10 MG tablet Take 1 tablet (10 mg total) by mouth daily. 30 tablet 0   hyoscyamine  (LEVSIN  SL) 0.125 MG SL tablet Place 1 tablet (0.125 mg total) under the tongue in the morning and at bedtime. (Patient not taking: Reported on 03/15/2024) 180 tablet 4   levonorgestrel  (MIRENA ) 20 MCG/24HR IUD 1 Intra Uterine Device (1 each total) by Intrauterine route once. 1 each 0   linaclotide  (LINZESS ) 72 MCG capsule Take 1 capsule (72 mcg total) by mouth daily before breakfast. 30 capsule 6   lubiprostone  (AMITIZA ) 8 MCG capsule Take 1 capsule (8 mcg total) by mouth 2 (two) times daily with a meal. (Patient not taking: Reported on 03/15/2024) 180 capsule 3   metFORMIN  (GLUCOPHAGE ) 500 MG tablet Take 1 tablet (500 mg total) by mouth daily with breakfast. 30 tablet 2   naproxen  (NAPROSYN ) 500 MG tablet Take 1 tablet (500 mg total) by mouth  2 (two) times daily. 30 tablet 0   OLANZapine  zydis (ZYPREXA ) 20 MG disintegrating tablet Take 1 tablet (20 mg total) by mouth at bedtime. 90 tablet 0   polyethylene glycol powder (GLYCOLAX /MIRALAX ) 17 GM/SCOOP powder Take 17 g by mouth daily as needed. 850 g 2   No current facility-administered medications for this visit.   Objective  Psychiatric Specialty Exam: General Appearance: appears at stated age, casually dressed and groomed   Behavior: pleasant and cooperative   Psychomotor Activity: no psychomotor agitation or retardation noted   Eye Contact: fair  Speech: normal amount, volume and fluency    Mood: euthymic  Affect: congruent, pleasant and interactive   Thought Process: linear, goal directed for most of the assessment; some circumstantial thoughts Descriptions of Associations: intact   Thought Content Hallucinations: denies AH, VH , does not appear responding to stimuli  Delusions: no paranoia, delusions of control, grandeur, ideas of reference, thought broadcasting, and magical thinking  Suicidal Thoughts: denies SI, intention, plan  Homicidal Thoughts: denies HI, intention, plan   Alertness/Orientation: alert and fully oriented   Insight: fair Judgment: fair  Memory: intact   Executive Functions  Concentration: intact  Attention Span: fair  Recall: intact  Fund of Knowledge: fair   Physical Exam  General: Pleasant, well-appearing. No acute distress. Pulmonary: Normal effort. No wheezing or rales. Skin: No obvious rash or lesions. Neuro: A&Ox3.No focal deficit.  Review of Systems  No reported symptoms   Metabolic Disorder Labs: Lab Results  Component Value Date   HGBA1C 5.9 (H) 01/11/2024   MPG 105 03/02/2017   MPG 94 07/27/2016   Lab Results  Component Value Date   PROLACTIN 66.1 (H) 01/11/2024   PROLACTIN 49.3 (H) 06/30/2023   Lab Results  Component Value Date   CHOL 210 (H) 01/11/2024   TRIG 82 01/11/2024   HDL 44 01/11/2024    CHOLHDL 4.8 (H) 01/11/2024   VLDL 16 03/02/2017   LDLCALC 151 (H) 01/11/2024   LDLCALC 150 (H) 06/30/2023   Lab Results  Component Value Date   TSH 1.840 01/11/2024   TSH 3.130 06/30/2023    Therapeutic Level Labs: No results found for: LITHIUM No results found for: VALPROATE No results found for: CBMZ  Screenings: PHQ2-9    Flowsheet Row [facility] Video Visit from [facility] Office Visit from 09/23/2022 in [facility] [place] Video Visit from 09[facility]17/2022 in [facility] Video Visit from 06/25/2022 in [facility]  PHQ-2 Total Score 1 3 2 1 3   PHQ-9 Total Score -- 6 10 -- 6   Flowsheet Row ED from 10/17/2023 in [facility] Video Visit from 03/11/2023 in [facility] Video Visit from 01/07/2023 in [facility]  C-SSRS RISK CATEGORY No Risk No Risk No Risk    Ismael Franco, MD PGY-3 Psychiatry Resident

## 2024-06-14 ENCOUNTER — Ambulatory Visit (HOSPITAL_BASED_OUTPATIENT_CLINIC_OR_DEPARTMENT_OTHER): Admitting: Psychiatry

## 2024-06-14 VITALS — BP 136/95 | Wt 191.2 lb

## 2024-06-14 DIAGNOSIS — Z0189 Encounter for other specified special examinations: Secondary | ICD-10-CM

## 2024-06-14 DIAGNOSIS — F330 Major depressive disorder, recurrent, mild: Secondary | ICD-10-CM

## 2024-06-14 DIAGNOSIS — F203 Undifferentiated schizophrenia: Secondary | ICD-10-CM

## 2024-06-14 DIAGNOSIS — Z79899 Other long term (current) drug therapy: Secondary | ICD-10-CM | POA: Diagnosis not present

## 2024-06-14 MED ORDER — OLANZAPINE 20 MG PO TBDP
20.0000 mg | ORAL_TABLET | Freq: Every day | ORAL | 0 refills | Status: DC
Start: 2024-06-14 — End: 2024-07-17

## 2024-06-14 MED ORDER — METFORMIN HCL 500 MG PO TABS: 500.0000 mg | ORAL_TABLET | Freq: Every day | ORAL | 2 refills | Status: DC

## 2024-06-14 NOTE — Patient Instructions (Signed)
 Thank you for attending your appointment today.  -- decrease cogentin   -- Continue other medications as prescribed.  Please do not make any changes to medications without first discussing with your provider. If you are experiencing a psychiatric emergency, please call 911 or present to your nearest emergency department. Additional crisis, medication management, and therapy resources are included below.  [facility]  [address], [place], [place] [address] [phone] WALK-IN URGENT CARE 24/7 FOR ANYONE [address] [place], [place]  [phone] Fax: [phone] [email or web address] *Interpreters available *Accepts all insurance and uninsured for Urgent Care needs *Accepts Medicaid and uninsured for outpatient treatment (below)      ONLY FOR [facility]  Below:    Outpatient New Patient Assessment/Therapy Walk-ins:        Monday, Wednesday, and Thursday 8am until slots are full (first come, first served)                   New Patient Psychiatry/Medication Management        Monday-Friday 8am-11am (first come, first served)               For all walk-ins we ask that you arrive by 7:15am, because patients will be seen in the order of arrival.

## 2024-06-14 NOTE — Progress Notes (Deleted)
 BH MD Outpatient Progress Note  06/14/2024 10:18 AM  Natalie Wagner  MRN:  [id number]  Assessment:  Natalie Wagner presents for follow-up evaluation virtually. Today, patient reports overall stability on her current medication regimen. Her mood has been stable.  She also denies symptoms of mania/hypomania, although she does present more hyperverbal today. She does note an increase in anxiety due to resurfaced childhood fear of dogs, as her brother brought home a new dog. She inquires about medication for anxiety but discussed self-soothing techniques and overcoming fear with her instead, to which she was amenable. Her psychosis has been well-controlled. She continues to work with her therapist, Natalie Rosser, LCSW, on perceptual challenging/reality testing, and this has been very beneficial for her. She is somewhat more disorganized in discussion today, which is a change from previous visits. Do not believe medication adjustments are warranted, but advised next provider to keep a close eye on this. Patient is not a safety concern toward herself nor others. Will continue current medication regimen without adjustments today.  ***  Identifying Information: Natalie Wagner is a 28 y.o. female with a history of schizophrenia, disorganized type, and MDD  who is an established patient with [facility] for follow-up for management of medications.   Plan:  # Schizophrenia, disorganized type # Long-term Current Use of High Risk Medication Past medication trials: Abilify  -- Continue Zyprexa  20 mg at bedtime  -- Most recent labs on 01/2024 -- Continue Cogentin  2 mg BID for EPS (patient has only been requiring at bedtime, but she does still experience some mild EPS in the mornings, so will begin taking as previously prescribed) -- Continue Metformin  500 mg daily with breakfast for antipsychotic-induced weight gain, and in the setting of prediabetes. Will not adjust dosage as  patient works with PCP on chronic constipation.   Patient was given contact information for behavioral health clinic and was instructed to call 911 for emergencies.    Patient and plan of care will be discussed with the Attending MD, Natalie Wagner, who agrees with the above statement and plan.   Subjective:  Chief Complaint:  Chief Complaint  Patient presents with   Medication Refill   Follow-up   Schizophrenia    Interval History:   Patient seen ***.  Patient reports feeling *** today. Since the previous visit, ***. Regarding medications, patient notes ***. Patient reports the following adverse effects: ***.   Patient reports *** sleep, ***. Patient reports *** appetite, ***.   Patient rates anxiety a ***/10, depression a ***/10, and anger a ***/10.   Patient denies current SI, HI, and AVH. ***  Stressors include ***.   Substance use: ***  Muscle jerks improving on cogentin ? Constipation?    Visit Diagnosis:    ICD-10-CM   1. 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  Previous psychiatrist/therapist: Dr. Brutus, Natalie Wagner. Currently sees Natalie Rosser LCSW for therapy Hospitalizations: 2017- [facility] Suicide attempts: Yes, per previous documentation SIB: Denies Hx of violence towards others: Yes, which prompted first psychiatric hospitalization Current access to guns: Denies Hx of trauma/abuse: Denies Substance use: Denies  Past Medical History:  Past Medical History:  Diagnosis Date   Asthma    prn inhaler   Constipation 05/05/2013   Difficulty swallowing pills    Eating disorder 07/27/2016   Elevated prolactin  level 12/27/2014   Galactorrhea 12/27/2014   History of seizures as a child    mother states were triggered by migraines; no seizures in > 7 yr.    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 of being hard to wake up post-op   Hypertension Maternal Grandmother    Stroke Maternal Grandmother    Asthma Mother    Hypertension Maternal Uncle    Depression Sister    Cancer Other     Social History:  Living: *** Occupation: *** Support: *** Legal History: ***  Social History   Socioeconomic History   Marital status: Single    Spouse name: Not on file   Number of children: Not on file   Years of education: Not on file   Highest education level: Not on file  Occupational History   Not on file  Tobacco Use   Smoking status: Never   Smokeless tobacco: Never  Vaping Use   Vaping status: Never Used  Substance and Sexual Activity   Alcohol use: No    Alcohol/week: 0.0 standard drinks of alcohol   Drug use: No   Sexual activity: Never    Birth control/protection: I.U.D.  Other Topics Concern   Not on file  Social History Narrative   Not on file   Social Drivers of Health   Financial Resource Strain: Not on file  Food Insecurity: Not on file  Transportation Needs: Not on file  Physical Activity: Not on file  Stress: Not on  file  Social Connections: Not on file    Allergies:  Allergies  Allergen Reactions   Lactose Intolerance (Gi) Diarrhea    Current Medications: Current Outpatient Medications  Medication Sig Dispense Refill   albuterol  (VENTOLIN  HFA) 108 (90 Base) MCG/ACT inhaler Inhale 1-2 puffs into the lungs every 6 (six) hours as needed for wheezing or shortness of breath. 6.7 g 0   benztropine  (COGENTIN ) 2 MG tablet Take 1 tablet (2 mg total) by mouth 2 (two) times daily. 180 tablet 0   cetirizine  (ZYRTEC  ALLERGY) 10 MG tablet Take 1 tablet (10 mg total) by mouth daily. 30 tablet 0   hyoscyamine  (LEVSIN  SL) 0.125 MG SL tablet Place 1 tablet (0.125 mg total) under the tongue in the morning and at bedtime. (Patient not taking: Reported on 03/15/2024) 180 tablet 4   levonorgestrel  (MIRENA ) 20 MCG/24HR IUD 1 Intra Uterine Device (1 each total) by Intrauterine route once. 1 each 0   linaclotide  (LINZESS ) 72 MCG capsule Take 1 capsule (72 mcg total) by mouth daily before breakfast.  30 capsule 6   lubiprostone  (AMITIZA ) 8 MCG capsule Take 1 capsule (8 mcg total) by mouth 2 (two) times daily with a meal. (Patient not taking: Reported on 03/15/2024) 180 capsule 3   metFORMIN  (GLUCOPHAGE ) 500 MG tablet Take 1 tablet (500 mg total) by mouth daily with breakfast. 30 tablet 2   naproxen  (NAPROSYN ) 500 MG tablet Take 1 tablet (500 mg total) by mouth 2 (two) times daily. 30 tablet 0   OLANZapine  zydis (ZYPREXA ) 20 MG disintegrating tablet Take 1 tablet (20 mg total) by mouth at bedtime. 90 tablet 0   polyethylene glycol powder (GLYCOLAX /MIRALAX ) 17 GM/SCOOP powder Take 17 g by mouth daily as needed. 850 g 2   No current facility-administered medications for this visit.   Objective  Psychiatric Specialty Exam: General Appearance: appears at stated age, casually dressed and groomed ***  Behavior: pleasant and cooperative ***  Psychomotor Activity: no psychomotor agitation or retardation noted ***  Eye Contact:  fair *** Speech: normal amount, volume and fluency ***   Mood: euthymic *** Affect: congruent, pleasant and interactive ***  Thought Process: linear, goal directed, no circumstantial or tangential thought process noted, no racing thoughts or flight of ideas *** Descriptions of Associations: intact ***  Thought Content Hallucinations: denies AH, VH , does not appear responding to stimuli *** Delusions: no paranoia, delusions of control, grandeur, ideas of reference, thought broadcasting, and magical thinking *** Suicidal Thoughts: denies SI, intention, plan *** Homicidal Thoughts: denies HI, intention, plan ***  Alertness/Orientation: alert and fully oriented ***  Insight: fair*** Judgment: fair***  Memory: intact ***  Executive Functions  Concentration: intact *** Attention Span: fair *** Recall: intact *** Fund of Knowledge: fair ***  Physical Exam *** General: Pleasant, well-appearing ***. No acute distress. Pulmonary: Normal effort. No wheezing or rales. Skin: No obvious rash or lesions. Neuro: A&Ox3.No focal deficit.  Review of Systems *** No reported symptoms   Metabolic Disorder Labs: Lab Results  Component Value Date   HGBA1C 5.9 (H) 01/11/2024   MPG 105 03/02/2017   MPG 94 07/27/2016   Lab Results  Component Value Date   PROLACTIN 66.1 (H) 01/11/2024   PROLACTIN 49.3 (H) 06/30/2023   Lab Results  Component Value Date   CHOL 210 (H) 01/11/2024   TRIG 82 01/11/2024   HDL 44 01/11/2024   CHOLHDL 4.8 (H) 01/11/2024   VLDL 16 03/02/2017   LDLCALC 151 (H) 01/11/2024   LDLCALC 150 (H) 06/30/2023   Lab Results  Component Value Date   TSH 1.840 01/11/2024   TSH 3.130 06/30/2023    Therapeutic Level Labs: No results found for: LITHIUM No results found for: VALPROATE No results found for: CBMZ  Screenings: PHQ2-9    Flowsheet Row [facility] Video Visit from 01/07/2023 in  [facility] Office Visit from 09/23/2022 in [facility] Of [place] Video Visit from 09[facility]17/2022 in [facility] Video Visit from 06/25/2022 in [facility]  PHQ-2 Total Score 1 3 2 1 3   PHQ-9 Total Score -- 6 10 -- 6   Flowsheet Row ED from 10/17/2023 in [facility] Video Visit from 03/11/2023 in [facility] Video Visit from 01/07/2023 in [facility]  C-SSRS RISK CATEGORY No Risk No Risk No Risk    Ismael Franco, MD PGY-3  Psychiatry Resident

## 2024-06-15 NOTE — Addendum Note (Signed)
 Addended by: CARVIN CROCK on: 06/15/2024 10:13 AM   Modules accepted: Level of Service

## 2024-06-29 ENCOUNTER — Ambulatory Visit (HOSPITAL_COMMUNITY): Admitting: Licensed Clinical Social Worker
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
## 2024-07-17 ENCOUNTER — Telehealth (HOSPITAL_BASED_OUTPATIENT_CLINIC_OR_DEPARTMENT_OTHER): Admitting: Psychiatry

## 2024-07-17 DIAGNOSIS — F203 Undifferentiated schizophrenia: Secondary | ICD-10-CM | POA: Diagnosis not present

## 2024-07-17 DIAGNOSIS — Z0189 Encounter for other specified special examinations: Secondary | ICD-10-CM

## 2024-07-17 DIAGNOSIS — Z79899 Other long term (current) drug therapy: Secondary | ICD-10-CM

## 2024-07-17 DIAGNOSIS — F330 Major depressive disorder, recurrent, mild: Secondary | ICD-10-CM

## 2024-07-17 MED ORDER — BENZTROPINE MESYLATE 1 MG PO TABS: 1.0000 mg | ORAL_TABLET | Freq: Every day | ORAL | 0 refills | Status: DC

## 2024-07-17 MED ORDER — METFORMIN HCL 500 MG PO TABS: 500.0000 mg | ORAL_TABLET | Freq: Two times a day (BID) | ORAL | 2 refills | Status: DC

## 2024-07-17 MED ORDER — OLANZAPINE 20 MG PO TBDP: 20.0000 mg | ORAL_TABLET | Freq: Every day | ORAL | 0 refills | Status: DC

## 2024-07-17 NOTE — Addendum Note (Signed)
 Addended by: CARVIN CROCK on: 07/17/2024 04:55 PM   Modules accepted: Level of Service

## 2024-08-15 ENCOUNTER — Ambulatory Visit (INDEPENDENT_AMBULATORY_CARE_PROVIDER_SITE_OTHER): Admitting: Family Medicine

## 2024-08-15 VITALS — BP 123/87 | HR 99 | Wt 192.6 lb

## 2024-08-15 DIAGNOSIS — Z30433 Encounter for removal and reinsertion of intrauterine contraceptive device: Secondary | ICD-10-CM | POA: Diagnosis not present

## 2024-08-15 MED ORDER — LINACLOTIDE 72 MCG PO CAPS: 72.0000 ug | ORAL_CAPSULE | Freq: Every day | ORAL | 2 refills | Status: AC

## 2024-08-15 NOTE — Patient Instructions (Signed)
 Thank you for visiting clinic today and allowing us  to participate in your care!  Today we replaced your Mirena  IUD. The Mirena  IUD should be effective for up to 8 years. You may experience some light bleeding and cramping after the placement. You may take Ibuprofen  for some relief.   Please schedule an appointment in 3 weeks for an IUD string check.  Reach out any time with any questions or concerns you may have - we are here for you!  Damien Cassis, MD [facility] [phone]

## 2024-08-15 NOTE — Progress Notes (Addendum)
    SUBJECTIVE:   CHIEF COMPLAINT / HPI:   IUD Presenting today for IUD replacement. Had Mirena  placed in Nov 2017, would like another Mirena . Feels Mirena  helped regulate her bleeding well. Patient is UTD on pap (normal 2023, due 2026).   PERTINENT  PMH / PSH: PCOS  OBJECTIVE:   BP 123/87   Pulse 99   Wt 192 lb 9.6 oz (87.4 kg)   SpO2 98%   BMI 38.90 kg/m   General: Well-appearing. Resting comfortably in room. Pulm: Breathing comfortably on room air. No increased WOB. Psych: Pleasant and appropriate.  GU: As noted in procedure details below.    ASSESSMENT/PLAN:   Assessment & Plan Encounter for IUD removal and reinsertion IUD Removal and Reinsertion performed by Dr Delores, assisted and chaperoned by CMA and Dr Diona  Patient given informed consent for IUD removal and re-insertion of new IUD. Signed copy in the chart. Sterile technique used. Patient placed in the lithotomy position and the cervix brought into view using speculum. The IUD strings were identified coming from the cervical os. These strings were grasped with ring forceps, and the IUD withdrawn gently from the uterus. There were no complications and no significant blood loss.    Sterile technique maintained. Cervix cleansed three times with betadine swabs.  A tenaculum was placed into the anterior lip of the cervix and a uterine sound was used to measure uterine size. A Mirena  IUD was placed into the endometrial cavity, deployed and secured. The applicator was removed. The strings were trimmed to 2 centimeters.  All equipment was removed and accounted for. There were no complications and the patient tolerated the procedure well.   After care discussed. Follow up in 3 weeks for string check.     Damien Diona, MD [facility]

## 2024-08-16 MED ORDER — LEVONORGESTREL 20 MCG/DAY IU IUD
1.0000 | INTRAUTERINE_SYSTEM | Freq: Once | INTRAUTERINE | Status: AC
  Administered 2024-08-15: 1 via INTRAUTERINE

## 2024-08-16 NOTE — Addendum Note (Signed)
 Addended by: Leahmarie Gasiorowski C on: 08/16/2024 10:00 AM   Modules accepted: Orders

## 2024-08-24 ENCOUNTER — Encounter (HOSPITAL_COMMUNITY): Payer: Self-pay

## 2024-08-24 ENCOUNTER — Encounter (HOSPITAL_COMMUNITY): Payer: Self-pay | Admitting: Licensed Clinical Social Worker

## 2024-08-24 ENCOUNTER — Ambulatory Visit (HOSPITAL_COMMUNITY): Admitting: Licensed Clinical Social Worker

## 2024-08-24 DIAGNOSIS — F203 Undifferentiated schizophrenia: Secondary | ICD-10-CM

## 2024-08-24 NOTE — Progress Notes (Signed)
 Virtual Visit via Video Note  I connected with [name] on 08/24/24 at 10:00 AM EDT by a video enabled telemedicine application and verified that I am speaking with the correct person using two identifiers.  Location: Patient: home Provider: home office   I discussed the limitations of evaluation and management by telemedicine and the availability of in person appointments. The patient expressed understanding and agreed to proceed.   I discussed the assessment and treatment plan with the patient. The patient was provided an opportunity to ask questions and all were answered. The patient agreed with the plan and demonstrated an understanding of the instructions.   The patient was advised to call back or seek an in-person evaluation if the symptoms worsen or if the condition fails to improve as anticipated.  I provided 50 minutes of non-face-to-face time during this encounter.   [name], LCSW   THERAPIST PROGRESS NOTE  Session Time: 10:00am-10:50am  Participation Level: Active  Behavioral Response: NeatAlertEuthymic  Type of Therapy: Individual Therapy  Treatment Goals addressed:  Problem: Anxiety     Dates: Start:  08/24/24       Disciplines: Interdisciplinary, PROVIDER        Goal: LTG: Shaunae will score less than 5 on the Generalized Anxiety Disorder 7 Scale (GAD-7)      Dates: Start:  08/24/24    Expected End:  08/23/25       Disciplines: Interdisciplinary, PROVIDER             Intervention: Perform motivational interviewing regarding use of tools     Dates: Start:  08/24/24         ProgressTowards Goals: Progressing  Interventions: Motivational Interviewing  Summary: MERLE WHITEHORN is a 28 y.o. female who presents with Schizophrenia, undifferentiated.   Suicidal/Homicidal: Nowithout intent/plan  Therapist Response: Ayva engaged well in individual virtual session with Facilities manager. Clinician utilized MI OARS to reflect and summarize  thoughts, feelings, and interactions. Clinician processed relationships in the family, interactions with siblings, and roles. Clinician explored anxiety levels and noted challenges with anxiety especially when trying something new, going new places, or meeting new people. Clinician discussed coping skills and identified points where a small risk can be taken to try something new without any manjor consequences. Daisee shared some desire to attend a day program, but reported that transportation is a barrier and most programs are for older people. Clinician will research other PSRs in the area to find more options.   Plan: Return again in 4 weeks.  Diagnosis: Undifferentiated schizophrenia (HCC)  Collaboration of Care: Patient refused AEB none required  Patient/Guardian was advised Release of Information must be obtained prior to any record release in order to collaborate their care with an outside provider. Patient/Guardian was advised if they have not already done so to contact the registration department to sign all necessary forms in order for us  to release information regarding their care.   Consent: Patient/Guardian gives verbal consent for treatment and assignment of benefits for services provided during this visit. Patient/Guardian expressed understanding and agreed to proceed.   Harlene JONELLE [place], LCSW 08/24/2024

## 2024-09-04 NOTE — Progress Notes (Unsigned)
    SUBJECTIVE:   CHIEF COMPLAINT / HPI:   String Check IUD was placed on 10/7. Tolerating the IUD well with no concerns.   PERTINENT  PMH / PSH: PCOS  OBJECTIVE:   BP 131/88   Pulse 99   Ht 4' 11 (1.499 m)   Wt 196 lb 6.4 oz (89.1 kg)   SpO2 100%   BMI 39.67 kg/m   General: Awake and Alert in NAD HEENT: NCAT. Sclera anicteric. No rhinorrhea. Respiratory: Normal WOB on RA.  Extremities: Able to move all extremities. No BLE edema, no deformities or significant joint findings. Skin: Warm and dry. Neuro: No focal neurological deficits. F GU: Normally developed genitalia with no external lesions or eruptions. Vagina and cervix show no lesions, inflammation, discharge or tenderness. IUD strings visible and appropriate in length from cervical os. Chaperoned by CMA Stacey Reaves.   ASSESSMENT/PLAN:   Assessment & Plan IUD (intrauterine device) in place IUD strings visible on speculum exam. No bleeding, irritation, or discomfort on exam. Advised patient to return if she has any concerns.    [name], DO [place] [facility]

## 2024-09-05 ENCOUNTER — Encounter: Payer: Self-pay | Admitting: Family Medicine

## 2024-09-05 ENCOUNTER — Ambulatory Visit: Admitting: Family Medicine

## 2024-09-05 VITALS — BP 131/88 | HR 99 | Ht 59.0 in | Wt 196.4 lb

## 2024-09-05 DIAGNOSIS — Z975 Presence of (intrauterine) contraceptive device: Secondary | ICD-10-CM | POA: Diagnosis not present

## 2024-09-05 NOTE — Assessment & Plan Note (Signed)
 IUD strings visible on speculum exam. No bleeding, irritation, or discomfort on exam. Advised patient to return if she has any concerns.

## 2024-09-05 NOTE — Patient Instructions (Signed)
 It was great to see you today! Thank you for choosing [facility] for your primary care. Natalie Wagner was seen for string check.  Today we addressed: String check - everything looks great, glad the IUD is working well for you!  You should return to our clinic No follow-ups on file. Please arrive 15 minutes before your appointment to ensure smooth check in process.  We appreciate your efforts in making this happen.  Thank you for allowing me to participate in your care, [name], DO 09/05/2024, 9:15 AM PGY-2, [facility]
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
## 2024-09-15 ENCOUNTER — Other Ambulatory Visit (HOSPITAL_COMMUNITY): Payer: Self-pay | Admitting: Psychiatry

## 2024-09-15 DIAGNOSIS — F203 Undifferentiated schizophrenia: Secondary | ICD-10-CM

## 2024-09-18 ENCOUNTER — Telehealth (HOSPITAL_COMMUNITY): Admitting: Psychiatry

## 2024-09-18 DIAGNOSIS — F330 Major depressive disorder, recurrent, mild: Secondary | ICD-10-CM | POA: Diagnosis not present

## 2024-09-18 DIAGNOSIS — Z79899 Other long term (current) drug therapy: Secondary | ICD-10-CM | POA: Diagnosis not present

## 2024-09-18 DIAGNOSIS — Z0189 Encounter for other specified special examinations: Secondary | ICD-10-CM

## 2024-09-18 DIAGNOSIS — F203 Undifferentiated schizophrenia: Secondary | ICD-10-CM | POA: Diagnosis not present

## 2024-09-18 MED ORDER — BENZTROPINE MESYLATE 1 MG PO TABS: 1.0000 mg | ORAL_TABLET | Freq: Every day | ORAL | 0 refills | Status: DC

## 2024-09-18 MED ORDER — METFORMIN HCL 500 MG PO TABS: 500.0000 mg | ORAL_TABLET | Freq: Every day | ORAL | 0 refills | Status: DC

## 2024-09-18 MED ORDER — OLANZAPINE 20 MG PO TBDP: 20.0000 mg | ORAL_TABLET | Freq: Every day | ORAL | 0 refills | Status: DC

## 2024-09-18 NOTE — Addendum Note (Signed)
 Addended by: CARVIN CROCK on: 09/18/2024 03:34 PM   Modules accepted: Level of Service

## 2024-09-21 ENCOUNTER — Ambulatory Visit (HOSPITAL_COMMUNITY): Admitting: Licensed Clinical Social Worker

## 2024-09-21 DIAGNOSIS — F203 Undifferentiated schizophrenia: Secondary | ICD-10-CM | POA: Diagnosis not present

## 2024-09-25 ENCOUNTER — Encounter: Payer: Self-pay | Admitting: Family Medicine

## 2024-09-25 ENCOUNTER — Ambulatory Visit: Admitting: Family Medicine

## 2024-09-25 VITALS — BP 127/77 | HR 95 | Ht 59.0 in | Wt 193.6 lb

## 2024-09-25 DIAGNOSIS — K602 Anal fissure, unspecified: Secondary | ICD-10-CM

## 2024-09-25 NOTE — Patient Instructions (Signed)
 It was wonderful to see you today!  Your bleeding was caused by something called an anal fissure.  This can happen when you are chronically constipated and straining very hard.  You also have some hemorrhoids which could also contribute to the formation of these fissures.  While they are very painful and can be distressing they are not serious and typically heal on their own within 24 hours of occurrence.  With this type of injury it is important to remember to stay hydrated and do everything you can to soften your stools to prevent them from recurring.  To reduce pain you can try using a Peri bottle and patting your bottom dry rather than wiping directly with toilet paper or baby wipes.  Please call [phone] with any questions about today's appointment.   If you need any additional refills, please call your pharmacy before calling the office.  Lucie Pinal, DO Family Medicine

## 2024-09-25 NOTE — Progress Notes (Signed)
    SUBJECTIVE:   CHIEF COMPLAINT / HPI:   Blood in the stool, recently seen by Drs. Delores and Diona for IUD removal and insertion.   Just noticed this morning.  Has a history of IBS type C.  Reports that she did not have a bowel movement yesterday and this morning she had some left-sided stomach pain and cramping as well as a rumbling sensation and when she went to the bathroom she had a straining.  She reports that she felt a large amount of burning and discomfort as she was straining to defecate and noticed when she wiped that there was blood on the toilet paper.  Blood was bright red there was no evidence of blood in the bowl that she noticed.  This is not ever happened before.  PERTINENT  PMH / PSH: IBS  OBJECTIVE:   BP 127/77   Pulse 95   Ht 4' 11 (1.499 m)   Wt 193 lb 9.6 oz (87.8 kg)   SpO2 100%   BMI 39.10 kg/m   General: Alert, NAD Abdominal: Flat, soft, nontender GU: Patient requested mother as chaperone, mother agreeable.  Small external nonthrombosed hemorrhoid at 6 o'clock position when viewing anus from left lateral decubitus with underlying small anal fissure.  ASSESSMENT/PLAN:   Assessment & Plan Anal fissure - Recommended supportive measures including Tylenol  for pain, reduce irritation to the area and adequate hydration to avoid recurrence -Provided reassurance that this was nothing serious and would resolve in few days -Patient has extensive interventions from her GI provider for her chronic constipation, advised continuing those and following up as needed   Lucie Pinal, DO [facility]

## 2024-10-04 ENCOUNTER — Encounter (HOSPITAL_COMMUNITY): Payer: Self-pay | Admitting: Licensed Clinical Social Worker

## 2024-10-04 NOTE — Progress Notes (Signed)
 Virtual Visit via Video Note  I connected with [name] on 09/21/24 at 11:00 AM EST by a video enabled telemedicine application and verified that I am speaking with the correct person using two identifiers.  Location: Patient: home Provider: home office   I discussed the limitations of evaluation and management by telemedicine and the availability of in person appointments. The patient expressed understanding and agreed to proceed.   I discussed the assessment and treatment plan with the patient. The patient was provided an opportunity to ask questions and all were answered. The patient agreed with the plan and demonstrated an understanding of the instructions.   The patient was advised to call back or seek an in-person evaluation if the symptoms worsen or if the condition fails to improve as anticipated.  I provided 45 minutes of non-face-to-face time during this encounter.   [name], LCSW   THERAPIST PROGRESS NOTE  Session Time: 11:00am-11:45am  Participation Level: Active  Behavioral Response: NeatAlertEuthymic  Type of Therapy: Individual Therapy  Treatment Goals addressed:  Problem: Anxiety      Dates: Start:  08/24/24        Disciplines: Interdisciplinary, PROVIDER            Goal: LTG: Dollye will score less than 5 on the Generalized Anxiety Disorder 7 Scale (GAD-7)      Dates: Start:  08/24/24    Expected End:  08/23/25       Disciplines: Interdisciplinary, PROVIDER                Intervention: Perform motivational interviewing regarding use of tools     Dates: Start:  08/24/24         ProgressTowards Goals: Progressing  Interventions: Solution Focused  Summary: BERNARDINA CACHO is a 28 y.o. female who presents with undifferentiated schizophrenia.   Suicidal/Homicidal: Nowithout intent/plan  Therapist Response: Syriah engaged well in individual virtual session with facilities manager. Clinician utilized solution-focused therapy to process  thoughts, feelings, and interactions. Clinician explored interactions, activities, and responsibilities at home. Clinician discussed options for focusing in on her goals for anxiety. Clinician identified that when anxiety levels rise, Shalena will freeze until she can figure out her next steps. Clinician explored her understanding of her triggers and discussed ways to get prepared for the triggers she knows are coming. Clinician identified that preparedness and self-understanding is the key to managing expected triggers.   Plan: Return again in 4 weeks.  Diagnosis: Undifferentiated schizophrenia (HCC)  Collaboration of Care: Medication Management AEB reviewed note from Dr. Izella  Patient/Guardian was advised Release of Information must be obtained prior to any record release in order to collaborate their care with an outside provider. Patient/Guardian was advised if they have not already done so to contact the registration department to sign all necessary forms in order for us  to release information regarding their care.   Consent: Patient/Guardian gives verbal consent for treatment and assignment of benefits for services provided during this visit. Patient/Guardian expressed understanding and agreed to proceed.   Harlene JONELLE [place], LCSW 10/04/2024

## 2024-10-26 ENCOUNTER — Encounter (HOSPITAL_COMMUNITY): Payer: Self-pay | Admitting: Licensed Clinical Social Worker

## 2024-10-26 ENCOUNTER — Ambulatory Visit (HOSPITAL_COMMUNITY): Admitting: Licensed Clinical Social Worker

## 2024-10-26 DIAGNOSIS — F203 Undifferentiated schizophrenia: Secondary | ICD-10-CM

## 2024-10-26 NOTE — Progress Notes (Signed)
 Virtual Visit via Video Note  I connected with Natalie Wagner on 10/26/2024 at 11:00 AM EST by a video enabled telemedicine application and verified that I am speaking with the correct person using two identifiers.  Location: Patient: home Provider: home office   I discussed the limitations of evaluation and management by telemedicine and the availability of in person appointments. The patient expressed understanding and agreed to proceed.   I discussed the assessment and treatment plan with the patient. The patient was provided an opportunity to ask questions and all were answered. The patient agreed with the plan and demonstrated an understanding of the instructions.   The patient was advised to call back or seek an in-person evaluation if the symptoms worsen or if the condition fails to improve as anticipated.  I provided 45 minutes of non-face-to-face time during this encounter.   Natalie JONELLE Rosser, LCSW   THERAPIST PROGRESS NOTE  Session Time: 11:00am-11:45amq   Participation Level: Active  Behavioral Response: Well GroomedAlertEuthymic  Type of Therapy: Individual Therapy  Treatment Goals addressed:  Problem: Anxiety        Dates: Start:  08/24/24          Disciplines: Interdisciplinary, PROVIDER              Goal: LTG: Imogean will score less than 5 on the Generalized Anxiety Disorder 7 Scale (GAD-7)      Dates: Start:  08/24/24    Expected End:  08/23/25       Disciplines: Interdisciplinary, PROVIDER                Intervention: Perform motivational interviewing regarding use of tools     Dates: Start:  08/24/24         ProgressTowards Goals: Progressing  Interventions: Motivational Interviewing  Summary: Natalie Wagner is a 28 y.o. female who presents with Undifferentiated Schizophrenia.   Suicidal/Homicidal: Nowithout intent/plan  Therapist Response: Shon engaged well in individual virtual session with facilities manager. Clinician utilized MI OARS  to reflect and summarize thoughts, feelings, and interactions. Clinician explored daily routine and noted continuing lack of movement and time outside the house. Clinician discussed options for referral to PSR. Clinician shared info about [employer] [facility] and offered referral for the coming year. Natalie Wagner agreed that it looked like a good option, and she will share this with mom and sister. Clinician encouraged Natalie Wagner to think about goals for the coming year and to think about what therapy can do to assist in meeting those goals.   Plan: Return again in 4 weeks.  Diagnosis: Undifferentiated schizophrenia (HCC)  Collaboration of Care: Psychiatrist AEB updated Dr. Izella about plan for [facility] in the coming year  Patient/Guardian was advised Release of Information must be obtained prior to any record release in order to collaborate their care with an outside provider. Patient/Guardian was advised if they have not already done so to contact the registration department to sign all necessary forms in order for us  to release information regarding their care.   Consent: Patient/Guardian gives verbal consent for treatment and assignment of benefits for services provided during this visit. Patient/Guardian expressed understanding and agreed to proceed.   Natalie JONELLE [place], LCSW 10/26/2024

## 2024-11-23 ENCOUNTER — Ambulatory Visit (HOSPITAL_COMMUNITY): Admitting: Licensed Clinical Social Worker
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
## 2024-12-11 ENCOUNTER — Telehealth (HOSPITAL_COMMUNITY): Admitting: Psychiatry

## 2024-12-11 DIAGNOSIS — F203 Undifferentiated schizophrenia: Secondary | ICD-10-CM

## 2024-12-11 DIAGNOSIS — F330 Major depressive disorder, recurrent, mild: Secondary | ICD-10-CM | POA: Diagnosis not present

## 2024-12-11 DIAGNOSIS — Z79899 Other long term (current) drug therapy: Secondary | ICD-10-CM

## 2024-12-11 DIAGNOSIS — Z0189 Encounter for other specified special examinations: Secondary | ICD-10-CM

## 2024-12-11 MED ORDER — BENZTROPINE MESYLATE 1 MG PO TABS: 1.0000 mg | ORAL_TABLET | Freq: Every day | ORAL | 0 refills | Status: AC | PRN

## 2024-12-11 MED ORDER — METFORMIN HCL 500 MG PO TABS: 500.0000 mg | ORAL_TABLET | Freq: Every day | ORAL | 0 refills | Status: AC

## 2024-12-11 MED ORDER — OLANZAPINE 20 MG PO TBDP: 20.0000 mg | ORAL_TABLET | Freq: Every day | ORAL | 0 refills | Status: AC

## 2025-01-04 ENCOUNTER — Ambulatory Visit (HOSPITAL_COMMUNITY): Admitting: Licensed Clinical Social Worker

## 2025-03-07 ENCOUNTER — Ambulatory Visit (HOSPITAL_COMMUNITY): Admitting: Psychiatry
